# Patient Record
Sex: Female | Born: 1935
Health system: Southern US, Community
[De-identification: ages and names within clinical notes are randomized; demographics above are authoritative.]

## PROBLEM LIST (undated history)

## (undated) DIAGNOSIS — M810 Age-related osteoporosis without current pathological fracture: Secondary | ICD-10-CM

## (undated) DIAGNOSIS — W19XXXA Unspecified fall, initial encounter: Secondary | ICD-10-CM

## (undated) DIAGNOSIS — N200 Calculus of kidney: Secondary | ICD-10-CM

## (undated) DIAGNOSIS — G20A1 Parkinson's disease without dyskinesia, without mention of fluctuations: Secondary | ICD-10-CM

## (undated) DIAGNOSIS — Z1211 Encounter for screening for malignant neoplasm of colon: Secondary | ICD-10-CM

## (undated) DIAGNOSIS — M797 Fibromyalgia: Secondary | ICD-10-CM

## (undated) DIAGNOSIS — R011 Cardiac murmur, unspecified: Secondary | ICD-10-CM

## (undated) DIAGNOSIS — R251 Tremor, unspecified: Secondary | ICD-10-CM

## (undated) DIAGNOSIS — I10 Essential (primary) hypertension: Secondary | ICD-10-CM

## (undated) DIAGNOSIS — S42309A Unspecified fracture of shaft of humerus, unspecified arm, initial encounter for closed fracture: Secondary | ICD-10-CM

## (undated) DIAGNOSIS — Z923 Personal history of irradiation: Secondary | ICD-10-CM

## (undated) DIAGNOSIS — Z853 Personal history of malignant neoplasm of breast: Secondary | ICD-10-CM

## (undated) DIAGNOSIS — G4730 Sleep apnea, unspecified: Secondary | ICD-10-CM

## (undated) DIAGNOSIS — C50919 Malignant neoplasm of unspecified site of unspecified female breast: Secondary | ICD-10-CM

## (undated) DIAGNOSIS — M1990 Unspecified osteoarthritis, unspecified site: Secondary | ICD-10-CM

## (undated) DIAGNOSIS — M719 Bursopathy, unspecified: Secondary | ICD-10-CM

## (undated) DIAGNOSIS — Z1239 Encounter for other screening for malignant neoplasm of breast: Secondary | ICD-10-CM

## (undated) DIAGNOSIS — G20 Parkinson's disease: Secondary | ICD-10-CM

## (undated) HISTORY — DX: Unspecified fall, initial encounter: W19.XXXA

## (undated) HISTORY — DX: Encounter for other screening for malignant neoplasm of breast: Z12.39

## (undated) HISTORY — DX: Bursopathy, unspecified: M71.9

## (undated) HISTORY — DX: Fibromyalgia: M79.7

## (undated) HISTORY — DX: Tremor, unspecified: R25.1

## (undated) HISTORY — DX: Encounter for screening for malignant neoplasm of colon: Z12.11

## (undated) HISTORY — PX: OTHER SURGICAL HISTORY: SHX169

## (undated) HISTORY — DX: Age-related osteoporosis without current pathological fracture: M81.0

## (undated) HISTORY — DX: Personal history of malignant neoplasm of breast: Z85.3

## (undated) HISTORY — DX: Unspecified osteoarthritis, unspecified site: M19.90

## (undated) HISTORY — DX: Essential (primary) hypertension: I10

## (undated) HISTORY — DX: Cardiac murmur, unspecified: R01.1

## (undated) HISTORY — DX: Sleep apnea, unspecified: G47.30

## (undated) HISTORY — DX: Calculus of kidney: N20.0

## (undated) HISTORY — DX: Unspecified fracture of shaft of humerus, unspecified arm, initial encounter for closed fracture: S42.309A

---

## 1972-10-18 HISTORY — PX: APPENDECTOMY: SHX54

## 1972-10-18 HISTORY — PX: ABDOMINAL HYSTERECTOMY: SHX81

## 2002-10-18 HISTORY — PX: COLONOSCOPY: SHX174

## 2004-10-18 DIAGNOSIS — G4730 Sleep apnea, unspecified: Secondary | ICD-10-CM

## 2004-10-18 HISTORY — DX: Sleep apnea, unspecified: G47.30

## 2005-04-01 ENCOUNTER — Ambulatory Visit: Payer: Self-pay | Admitting: Family Medicine

## 2006-05-14 ENCOUNTER — Emergency Department: Payer: Self-pay | Admitting: Emergency Medicine

## 2006-06-15 ENCOUNTER — Ambulatory Visit: Payer: Self-pay | Admitting: Urology

## 2006-06-15 ENCOUNTER — Other Ambulatory Visit: Payer: Self-pay

## 2006-06-16 ENCOUNTER — Ambulatory Visit: Payer: Self-pay | Admitting: Urology

## 2006-07-12 ENCOUNTER — Ambulatory Visit: Payer: Self-pay | Admitting: Family Medicine

## 2006-10-18 HISTORY — PX: LITHOTRIPSY: SUR834

## 2007-07-17 ENCOUNTER — Encounter: Payer: Self-pay | Admitting: Family Medicine

## 2007-07-19 ENCOUNTER — Encounter: Payer: Self-pay | Admitting: Family Medicine

## 2007-08-16 ENCOUNTER — Ambulatory Visit: Payer: Self-pay | Admitting: Family Medicine

## 2007-10-03 ENCOUNTER — Ambulatory Visit: Payer: Self-pay | Admitting: Family Medicine

## 2008-01-05 ENCOUNTER — Ambulatory Visit: Payer: Self-pay | Admitting: Family Medicine

## 2008-05-08 ENCOUNTER — Ambulatory Visit: Payer: Self-pay | Admitting: Family Medicine

## 2008-05-16 ENCOUNTER — Ambulatory Visit: Payer: Self-pay | Admitting: Family Medicine

## 2008-08-12 ENCOUNTER — Ambulatory Visit: Payer: Self-pay

## 2008-10-18 DIAGNOSIS — Z853 Personal history of malignant neoplasm of breast: Secondary | ICD-10-CM

## 2008-10-18 DIAGNOSIS — Z923 Personal history of irradiation: Secondary | ICD-10-CM

## 2008-10-18 DIAGNOSIS — C50919 Malignant neoplasm of unspecified site of unspecified female breast: Secondary | ICD-10-CM

## 2008-10-18 HISTORY — PX: BREAST SURGERY: SHX581

## 2008-10-18 HISTORY — PX: BREAST EXCISIONAL BIOPSY: SUR124

## 2008-10-18 HISTORY — DX: Malignant neoplasm of unspecified site of unspecified female breast: C50.919

## 2008-10-18 HISTORY — DX: Personal history of irradiation: Z92.3

## 2008-10-18 HISTORY — DX: Personal history of malignant neoplasm of breast: Z85.3

## 2008-10-24 ENCOUNTER — Ambulatory Visit: Payer: Self-pay | Admitting: Family Medicine

## 2008-11-04 ENCOUNTER — Ambulatory Visit: Payer: Self-pay | Admitting: Family Medicine

## 2009-01-09 ENCOUNTER — Ambulatory Visit: Payer: Self-pay | Admitting: General Surgery

## 2009-01-15 ENCOUNTER — Ambulatory Visit: Payer: Self-pay | Admitting: General Surgery

## 2009-01-16 ENCOUNTER — Ambulatory Visit: Payer: Self-pay | Admitting: Radiation Oncology

## 2009-02-03 ENCOUNTER — Ambulatory Visit: Payer: Self-pay | Admitting: Radiation Oncology

## 2009-02-15 ENCOUNTER — Ambulatory Visit: Payer: Self-pay | Admitting: Radiation Oncology

## 2009-03-18 ENCOUNTER — Ambulatory Visit: Payer: Self-pay | Admitting: Radiation Oncology

## 2009-04-17 ENCOUNTER — Ambulatory Visit: Payer: Self-pay | Admitting: Radiation Oncology

## 2009-05-18 ENCOUNTER — Ambulatory Visit: Payer: Self-pay | Admitting: Radiation Oncology

## 2009-06-18 ENCOUNTER — Ambulatory Visit: Payer: Self-pay | Admitting: General Surgery

## 2009-07-11 ENCOUNTER — Ambulatory Visit: Payer: Self-pay | Admitting: Physical Medicine and Rehabilitation

## 2009-08-19 ENCOUNTER — Ambulatory Visit: Payer: Self-pay | Admitting: Family Medicine

## 2009-09-04 ENCOUNTER — Encounter: Payer: Self-pay | Admitting: Rheumatology

## 2009-09-17 ENCOUNTER — Encounter: Payer: Self-pay | Admitting: Rheumatology

## 2009-09-17 ENCOUNTER — Ambulatory Visit: Payer: Self-pay | Admitting: Radiation Oncology

## 2009-09-26 ENCOUNTER — Ambulatory Visit: Payer: Self-pay | Admitting: Radiation Oncology

## 2009-10-18 ENCOUNTER — Ambulatory Visit: Payer: Self-pay | Admitting: Radiation Oncology

## 2009-10-18 DIAGNOSIS — M797 Fibromyalgia: Secondary | ICD-10-CM

## 2009-10-18 DIAGNOSIS — R251 Tremor, unspecified: Secondary | ICD-10-CM

## 2009-10-18 HISTORY — DX: Fibromyalgia: M79.7

## 2009-10-18 HISTORY — DX: Tremor, unspecified: R25.1

## 2009-12-22 ENCOUNTER — Ambulatory Visit: Payer: Self-pay | Admitting: General Surgery

## 2010-03-18 ENCOUNTER — Ambulatory Visit: Payer: Self-pay | Admitting: Radiation Oncology

## 2010-03-27 ENCOUNTER — Ambulatory Visit: Payer: Self-pay | Admitting: Radiation Oncology

## 2010-05-28 ENCOUNTER — Ambulatory Visit: Payer: Self-pay | Admitting: Gastroenterology

## 2010-05-28 HISTORY — PX: UPPER GI ENDOSCOPY: SHX6162

## 2010-06-30 ENCOUNTER — Ambulatory Visit: Payer: Self-pay | Admitting: General Surgery

## 2010-12-29 ENCOUNTER — Ambulatory Visit: Payer: Self-pay | Admitting: General Surgery

## 2010-12-30 ENCOUNTER — Ambulatory Visit: Payer: Self-pay | Admitting: General Surgery

## 2011-03-29 ENCOUNTER — Ambulatory Visit: Payer: Self-pay | Admitting: Radiation Oncology

## 2011-04-18 ENCOUNTER — Ambulatory Visit: Payer: Self-pay | Admitting: Radiation Oncology

## 2011-07-07 ENCOUNTER — Ambulatory Visit: Payer: Self-pay | Admitting: General Surgery

## 2012-01-26 ENCOUNTER — Ambulatory Visit: Payer: Self-pay | Admitting: General Surgery

## 2012-03-27 ENCOUNTER — Ambulatory Visit: Payer: Self-pay | Admitting: Radiation Oncology

## 2012-04-17 ENCOUNTER — Ambulatory Visit: Payer: Self-pay | Admitting: Radiation Oncology

## 2012-11-02 ENCOUNTER — Ambulatory Visit: Payer: Self-pay | Admitting: Family Medicine

## 2012-12-09 ENCOUNTER — Encounter: Payer: Self-pay | Admitting: General Surgery

## 2012-12-16 ENCOUNTER — Encounter: Payer: Self-pay | Admitting: General Surgery

## 2013-01-29 ENCOUNTER — Ambulatory Visit: Payer: Self-pay | Admitting: General Surgery

## 2013-01-29 ENCOUNTER — Other Ambulatory Visit: Payer: Self-pay | Admitting: General Surgery

## 2013-01-30 ENCOUNTER — Other Ambulatory Visit: Payer: Self-pay | Admitting: *Deleted

## 2013-01-30 DIAGNOSIS — Z853 Personal history of malignant neoplasm of breast: Secondary | ICD-10-CM

## 2013-01-30 MED ORDER — TAMOXIFEN CITRATE 20 MG PO TABS: 20.0000 mg | ORAL_TABLET | Freq: Every day | ORAL | Status: DC

## 2013-01-30 NOTE — Telephone Encounter (Signed)
Has been taking for 4 years, will need one more year

## 2013-02-02 ENCOUNTER — Ambulatory Visit: Payer: Self-pay | Admitting: General Surgery

## 2013-02-05 ENCOUNTER — Encounter: Payer: Self-pay | Admitting: General Surgery

## 2013-02-07 ENCOUNTER — Ambulatory Visit: Payer: Self-pay | Admitting: General Surgery

## 2013-02-20 ENCOUNTER — Encounter: Payer: Self-pay | Admitting: General Surgery

## 2013-03-01 ENCOUNTER — Ambulatory Visit: Payer: Self-pay | Admitting: General Surgery

## 2013-03-19 ENCOUNTER — Ambulatory Visit: Payer: Self-pay | Admitting: General Surgery

## 2013-03-26 ENCOUNTER — Ambulatory Visit: Payer: Self-pay | Admitting: Radiation Oncology

## 2013-04-17 ENCOUNTER — Ambulatory Visit: Payer: Self-pay | Admitting: Radiation Oncology

## 2013-04-24 ENCOUNTER — Ambulatory Visit: Payer: Self-pay | Admitting: General Surgery

## 2013-05-08 ENCOUNTER — Ambulatory Visit: Payer: Self-pay | Admitting: General Surgery

## 2013-05-10 ENCOUNTER — Ambulatory Visit (INDEPENDENT_AMBULATORY_CARE_PROVIDER_SITE_OTHER): Payer: Medicare Other | Admitting: General Surgery

## 2013-05-10 VITALS — BP 138/78 | HR 74 | Resp 12 | Ht 65.0 in | Wt 183.0 lb

## 2013-05-10 DIAGNOSIS — C50919 Malignant neoplasm of unspecified site of unspecified female breast: Secondary | ICD-10-CM

## 2013-05-10 DIAGNOSIS — Z853 Personal history of malignant neoplasm of breast: Secondary | ICD-10-CM

## 2013-05-10 DIAGNOSIS — C50912 Malignant neoplasm of unspecified site of left female breast: Secondary | ICD-10-CM

## 2013-05-10 NOTE — Progress Notes (Signed)
Patient ID: Payzlee Ryder, female   DOB: 1934/09/24, 79 y.o.   MRN: [id number]  Chief Complaint  Patient presents with  . Follow-up    mammogram    HPI Charolette Shere is a 79 y.o. female.  who presents for a follow up mammogram and breast evaluation.  She is now 4 years post left breast wide excision. The most recent mammogram was done on 02-02-13.  Patient does perform regular self breast checks and gets regular mammograms done.  No new breast issues. States she is "tired" in the mornings but later in day she seems to be "ok" and feels it may be related to Tamoxifen. The patient reminded me that 2 years ago we had a trial off tamoxifen with no improvement in her general sense of well-being or musculoskeletal symptoms.  HPI  Past Medical History  Diagnosis Date  . Personal history of malignant neoplasm of breast 2010    left breast cancer; status post wide excision with mastoplasty followed by whole breast radiation and tamoxifen  . Fibromyalgia 2011  . Unspecified essential hypertension   . Sleep apnea 2006  . Arthritis   . Kidney stones   . Breast screening, unspecified   . Special screening for malignant neoplasms, colon   . Undiagnosed cardiac murmurs   . Tremor 2011  . Bursitis     left hip  . Malignant neoplasm of upper-outer quadrant of female breast     DCIS; diagnosed on January 15, 2009. The patient had originally undergone a stereotactic biopsy for microcalcifications. Atypical hyperplasia was identified. Wide excision was completed to confirm the presence or absence of DCIS or invasive tumor. The patient had a 0.5 cm foci of DCIS adjacent to the previous biopsy site. This was a grade 1 lesion with focal necrosis.     Past Surgical History  Procedure Laterality Date  . Abdominal hysterectomy  1974  . Colonoscopy  2004  . Breast surgery Left 2010    left breast wide excision mastoplasty  . Lithotripsy  2008  . Appendectomy  1974  . Upper gi endoscopy  May 28, 2010    Biopsy showed evidence of mild chronic gastritis. There was no evidence of H. Pylori infection. A normal duodenum was reported.   . Breast biopsy Left 2010    No family history on file.  Social History History  Substance Use Topics  . Smoking status: Never Smoker   . Smokeless tobacco: Not on file  . Alcohol Use: No    No Known Allergies  Current Outpatient Prescriptions  Medication Sig Dispense Refill  . felodipine (PLENDIL) 10 MG 24 hr tablet Take 10 mg by mouth daily.      . Ibuprofen (ADVIL PO) Take by mouth.      . metoprolol succinate (TOPROL-XL) 25 MG 24 hr tablet Take 25 mg by mouth daily.      Marland Kitchen omeprazole (PRILOSEC) 40 MG capsule       . sucralfate (CARAFATE) 1 G tablet Take 1 g by mouth daily.      . tamoxifen (NOLVADEX) 20 MG tablet Take 1 tablet (20 mg total) by mouth daily.  30 tablet  11  . zolpidem (AMBIEN) 10 MG tablet Take 10 mg by mouth daily.       No current facility-administered medications for this visit.    Review of Systems Review of Systems  Constitutional: Negative.   Respiratory: Negative.   Cardiovascular: Negative.     Blood pressure 138/78, pulse 74, resp.  rate 12, height 5\' 5"  (1.651 m), weight 183 lb (83.008 kg).  Physical Exam Physical Exam  Constitutional: She is oriented to person, place, and time. She appears well-developed and well-nourished.  Cardiovascular: Normal rate and regular rhythm.   Pulmonary/Chest: Effort normal and breath sounds normal. Right breast exhibits no inverted nipple, no mass, no nipple discharge, no skin change and no tenderness. Left breast exhibits inverted nipple (slightly).  Lymphadenopathy:    She has no cervical adenopathy.    She has no axillary adenopathy.  Neurological: She is alert and oriented to person, place, and time.  Skin: Skin is warm and dry.   well healed scar left breast and focal thickening 2  O'clock which is unchanged  Data Reviewed Bilateral mammograms dated February 02, 2013  were reviewed. BI-RAD-2.  Assessment    Benign breast exam.     Plan    We'll plan for a followup exam with bilateral mammograms in one year. She'll continue her tamoxifen in the interval.        Earline Mayotte 05/11/2013, 1:56 PM

## 2013-05-10 NOTE — Patient Instructions (Addendum)
Continue self breast exams. Call office for any new breast issues or concerns. 

## 2013-05-11 ENCOUNTER — Encounter: Payer: Self-pay | Admitting: General Surgery

## 2013-05-29 ENCOUNTER — Encounter: Payer: Self-pay | Admitting: General Surgery

## 2014-02-05 ENCOUNTER — Ambulatory Visit: Payer: Self-pay | Admitting: General Surgery

## 2014-02-05 ENCOUNTER — Encounter: Payer: Self-pay | Admitting: General Surgery

## 2014-02-21 ENCOUNTER — Ambulatory Visit: Payer: Medicare Other | Admitting: General Surgery

## 2014-03-07 ENCOUNTER — Ambulatory Visit: Payer: Commercial Managed Care - HMO | Admitting: General Surgery

## 2014-03-21 ENCOUNTER — Ambulatory Visit: Payer: Self-pay | Admitting: General Surgery

## 2014-04-09 ENCOUNTER — Encounter: Payer: Self-pay | Admitting: General Surgery

## 2014-04-10 ENCOUNTER — Ambulatory Visit: Payer: Self-pay | Admitting: General Surgery

## 2014-05-01 ENCOUNTER — Ambulatory Visit (INDEPENDENT_AMBULATORY_CARE_PROVIDER_SITE_OTHER): Payer: Commercial Managed Care - HMO | Admitting: General Surgery

## 2014-05-01 ENCOUNTER — Encounter: Payer: Self-pay | Admitting: General Surgery

## 2014-05-01 VITALS — BP 120/70 | Ht 65.0 in | Wt 178.0 lb

## 2014-05-01 DIAGNOSIS — C50912 Malignant neoplasm of unspecified site of left female breast: Secondary | ICD-10-CM

## 2014-05-01 DIAGNOSIS — C50919 Malignant neoplasm of unspecified site of unspecified female breast: Secondary | ICD-10-CM

## 2014-05-01 NOTE — Progress Notes (Signed)
Patient ID: Kristina Simmons, female   DOB: [birth year], 80 y.o.   MRN: [id number]  Chief Complaint  Patient presents with  . Follow-up    mammogram    HPI Kristina Simmons is a 80 y.o. female who presents for a breast evaluation. The most recent mammogram was done on 02/05/14. Patient does perform regular self breast checks and gets regular mammograms done.   The patient completed her 5 years of tamoxifen earlier this year. She discontinue the medication without olfactory HPI  Past Medical History  Diagnosis Date  . Personal history of malignant neoplasm of breast 2010    left breast cancer; status post wide excision with mastoplasty followed by whole breast radiation and tamoxifen  . Fibromyalgia 2011  . Unspecified essential hypertension   . Sleep apnea 2006  . Arthritis   . Kidney stones   . Breast screening, unspecified   . Special screening for malignant neoplasms, colon   . Undiagnosed cardiac murmurs   . Tremor 2011  . Bursitis     left hip  . Malignant neoplasm of upper-outer quadrant of female breast     DCIS; diagnosed on January 15, 2009. The patient had originally undergone a stereotactic biopsy for microcalcifications. Atypical hyperplasia was identified. Wide excision was completed to confirm the presence or absence of DCIS or invasive tumor. The patient had a 0.5 cm foci of DCIS adjacent to the previous biopsy site. This was a grade 1 lesion with focal necrosis.     Past Surgical History  Procedure Laterality Date  . Abdominal hysterectomy  1974  . Colonoscopy  2004  . Breast surgery Left 2010    left breast wide excision mastoplasty  . Lithotripsy  2008  . Appendectomy  1974  . Upper gi endoscopy  May 28, 2010    Biopsy showed evidence of mild chronic gastritis. There was no evidence of H. Pylori infection. A normal duodenum was reported.   . Breast biopsy Left 2010    No family history on file.  Social History History  Substance Use Topics  .  Smoking status: Never Smoker   . Smokeless tobacco: Not on file  . Alcohol Use: No    No Known Allergies  Current Outpatient Prescriptions  Medication Sig Dispense Refill  . Ibuprofen (ADVIL PO) Take by mouth.      . metoprolol succinate (TOPROL-XL) 25 MG 24 hr tablet Take 25 mg by mouth daily.      . ranitidine (ZANTAC) 300 MG tablet Take 300 mg by mouth at bedtime.       No current facility-administered medications for this visit.    Review of Systems Review of Systems  Constitutional: Negative.   Respiratory: Negative.   Cardiovascular: Negative.     Blood pressure 120/70, height 5\' 5"  (1.651 m), weight 178 lb (80.74 kg).  Physical Exam Physical Exam  Constitutional: She is oriented to person, place, and time. She appears well-developed.  Eyes: Conjunctivae are normal. No scleral icterus.  Neck: Neck supple.  Cardiovascular: Normal rate.   Murmur heard.  Systolic murmur is present with a grade of 2/6  Pulmonary/Chest: Effort normal and breath sounds normal. Right breast exhibits no inverted nipple, no mass, no nipple discharge, no skin change and no tenderness. Left breast exhibits no inverted nipple, no mass, no nipple discharge, no skin change and no tenderness.  Left breast focal thickening at 2 o'clock  Abdominal: Soft. Bowel sounds are normal. There is no tenderness.  Lymphadenopathy:  She has no cervical adenopathy.    She has no axillary adenopathy.  Neurological: She is alert and oriented to person, place, and time.  Skin: Skin is warm and dry.    Data Reviewed Mammogram dated 02/05/2014 was independently reviewed. No interval change. BI-RAD-2.  Assessment    Doing well enough 5 year status post left breast DCIS management    Plan    The patient is now 5 years status post low-grade DCIS. She is released from regular followup. She'll continue annual mammograms with her primary care provider. He is welcome to return at any time if she appreciates any  change on her clinical exam or of mammographic abnormalities are identified.     PCP: Mamie Laurel 05/02/2014, 3:22 PM

## 2014-05-01 NOTE — Patient Instructions (Signed)
Patient to return as needed. 

## 2014-05-02 ENCOUNTER — Encounter: Payer: Self-pay | Admitting: General Surgery

## 2014-08-19 ENCOUNTER — Encounter: Payer: Self-pay | Admitting: General Surgery

## 2015-03-25 ENCOUNTER — Telehealth: Payer: Self-pay

## 2015-03-25 NOTE — Telephone Encounter (Signed)
Pt is checking status on her mammogram referral appointment with [facility]. Please return call when complete

## 2015-03-31 NOTE — Telephone Encounter (Signed)
Could you please order a mammogram, I didn't see one ordered in the other system.

## 2015-04-04 ENCOUNTER — Other Ambulatory Visit: Payer: Self-pay | Admitting: Family Medicine

## 2015-04-11 NOTE — Telephone Encounter (Signed)
Please put in order for mammogram

## 2015-04-14 NOTE — Telephone Encounter (Signed)
Ok

## 2015-04-14 NOTE — Telephone Encounter (Deleted)
Have this been taken care of? If so please close encounter

## 2015-04-15 ENCOUNTER — Other Ambulatory Visit: Payer: Self-pay

## 2015-04-15 DIAGNOSIS — Z1239 Encounter for other screening for malignant neoplasm of breast: Secondary | ICD-10-CM

## 2015-04-24 ENCOUNTER — Other Ambulatory Visit: Payer: Self-pay | Admitting: Emergency Medicine

## 2015-04-24 DIAGNOSIS — Z1239 Encounter for other screening for malignant neoplasm of breast: Secondary | ICD-10-CM

## 2015-04-25 ENCOUNTER — Telehealth: Payer: Self-pay | Admitting: Emergency Medicine

## 2015-04-25 NOTE — Telephone Encounter (Signed)
[place] and dx mammogram appointment made at [facility] for May 01, 2015 at 1:20 pm

## 2015-04-25 NOTE — Progress Notes (Signed)
ok 

## 2015-05-01 ENCOUNTER — Other Ambulatory Visit: Payer: Self-pay | Admitting: Family Medicine

## 2015-05-01 ENCOUNTER — Encounter: Payer: Self-pay | Admitting: Family Medicine

## 2015-05-01 ENCOUNTER — Ambulatory Visit
Admission: RE | Admit: 2015-05-01 | Discharge: 2015-05-01 | Disposition: A | Discharge status: Home / Self Care | Payer: PPO | Source: Ambulatory Visit | Attending: Family Medicine | Admitting: Family Medicine

## 2015-05-01 ENCOUNTER — Ambulatory Visit: Payer: Self-pay

## 2015-05-01 DIAGNOSIS — Z1239 Encounter for other screening for malignant neoplasm of breast: Secondary | ICD-10-CM

## 2015-05-01 DIAGNOSIS — Z853 Personal history of malignant neoplasm of breast: Secondary | ICD-10-CM | POA: Diagnosis not present

## 2015-05-27 DIAGNOSIS — R251 Tremor, unspecified: Secondary | ICD-10-CM | POA: Insufficient documentation

## 2015-06-17 ENCOUNTER — Encounter: Payer: Self-pay | Admitting: Family Medicine

## 2015-06-17 ENCOUNTER — Ambulatory Visit (INDEPENDENT_AMBULATORY_CARE_PROVIDER_SITE_OTHER): Payer: PPO | Admitting: Family Medicine

## 2015-06-17 VITALS — BP 134/88 | HR 91 | Temp 98.6°F | Resp 16 | Ht 64.0 in | Wt 189.4 lb

## 2015-06-17 DIAGNOSIS — K219 Gastro-esophageal reflux disease without esophagitis: Secondary | ICD-10-CM

## 2015-06-17 DIAGNOSIS — G4700 Insomnia, unspecified: Secondary | ICD-10-CM

## 2015-06-17 DIAGNOSIS — G8929 Other chronic pain: Secondary | ICD-10-CM

## 2015-06-17 DIAGNOSIS — I10 Essential (primary) hypertension: Secondary | ICD-10-CM

## 2015-06-17 DIAGNOSIS — Z23 Encounter for immunization: Secondary | ICD-10-CM | POA: Diagnosis not present

## 2015-06-17 MED ORDER — CYCLOBENZAPRINE HCL 5 MG PO TABS: 5.0000 mg | ORAL_TABLET | Freq: Three times a day (TID) | ORAL | Status: DC | PRN

## 2015-06-17 MED ORDER — MELOXICAM 15 MG PO TABS: 15.0000 mg | ORAL_TABLET | Freq: Every day | ORAL | Status: DC

## 2015-06-17 NOTE — Progress Notes (Signed)
Name: Kristina Simmons   MRN: [id number]    DOB: 01/19/1934   Date:06/17/2015       Progress Note  Subjective  Chief Complaint  Chief Complaint  Patient presents with  . Hypertension  . Insomnia    Hypertension Associated symptoms include headaches and malaise/fatigue. Pertinent negatives include no blurred vision, chest pain, neck pain, orthopnea, palpitations or shortness of breath.  Insomnia Primary symptoms: malaise/fatigue.  PMH includes: depression.   Insomnia this is a follow-up for insomnia. This has been a chronic problem for many years. She has fragmented sleep as well as difficulty falling asleep and early awakening. It is happening most nights. Risk factors include anxiety and chronic pain  Hypertension   Patient presents for follow-up of hypertension. It has been present for over 10 years.  Patient states that there is compliance with medical regimen which consists of metoprolol 25 mg every 12 hours amlodipine 10 mg daily. . There is no end organ disease . Cardiac risk factors include hypertension hyperlipidemia and sedentary lifestyle Exercise regimen consist of minimal  Diet consist of some limitation of salt and fats   Chronic pain  Patient has a long-standing history of chronic pain in the sacral area. She's had multiple procedures as well as imaging studies and multiple consultations still has the pain intermittently. She manages with over-the-counter meds usually.  Fibromyalgia and chronic fatigue  Please problem has been present for over a decade. She has chronic muscle pain and spasm and fatigue. It varies according to stressors her sleep pattern and the weather pattern is and any other concomitant medical conditions. Past Medical History  Diagnosis Date  . Personal history of malignant neoplasm of breast 2010    left breast cancer; status post wide excision with mastoplasty followed by whole breast radiation and tamoxifen  . Fibromyalgia 2011  .  Unspecified essential hypertension   . Sleep apnea 2006  . Arthritis   . Kidney stones   . Breast screening, unspecified   . Special screening for malignant neoplasms, colon   . Undiagnosed cardiac murmurs   . Tremor 2011  . Bursitis     left hip  . Malignant neoplasm of upper-outer quadrant of female breast     DCIS; diagnosed on January 15, 2009. The patient had originally undergone a stereotactic biopsy for microcalcifications. Atypical hyperplasia was identified. Wide excision was completed to confirm the presence or absence of DCIS or invasive tumor. The patient had a 0.5 cm foci of DCIS adjacent to the previous biopsy site. This was a grade 1 lesion with focal necrosis. ER 90%, PR 50%     Social History  Substance Use Topics  . Smoking status: Never Smoker   . Smokeless tobacco: Not on file  . Alcohol Use: No     Current outpatient prescriptions:  .  amLODipine (NORVASC) 10 MG tablet, 1 (ONE) TABLET, ORAL, DAILY, Disp: 30 tablet, Rfl: 3 .  gabapentin (NEURONTIN) 100 MG capsule, Take by mouth., Disp: , Rfl:  .  Ibuprofen (ADVIL PO), Take by mouth., Disp: , Rfl:  .  metoprolol succinate (TOPROL-XL) 25 MG 24 hr tablet, Take 25 mg by mouth daily., Disp: , Rfl:  .  zolpidem (AMBIEN) 10 MG tablet, Take by mouth., Disp: , Rfl:  .  ranitidine (ZANTAC) 300 MG tablet, Take 300 mg by mouth at bedtime., Disp: , Rfl:   No Known Allergies  Review of Systems  Constitutional: Positive for malaise/fatigue. Negative for fever, chills and weight loss.  HENT: Positive for congestion. Negative for hearing loss, sore throat and tinnitus.   Eyes: Negative for blurred vision, double vision and redness.  Respiratory: Positive for cough. Negative for hemoptysis and shortness of breath.   Cardiovascular: Negative for chest pain, palpitations, orthopnea, claudication and leg swelling.  Gastrointestinal: Positive for heartburn. Negative for nausea, vomiting, diarrhea, constipation and blood in stool.   Genitourinary: Negative for dysuria, urgency, frequency and hematuria.  Musculoskeletal: Positive for joint pain (diffuse from her fibromyalgia). Negative for myalgias, back pain, falls and neck pain.       Sacroiliac pain of long-standing  Skin: Negative for itching.  Neurological: Positive for weakness and headaches. Negative for dizziness, tingling, tremors, focal weakness, seizures and loss of consciousness.  Endo/Heme/Allergies: Does not bruise/bleed easily.  Psychiatric/Behavioral: Positive for depression. Negative for substance abuse. The patient is nervous/anxious and has insomnia.      Objective  Filed Vitals:   06/17/15 1049  BP: 134/88  Pulse: 91  Temp: 98.6 F (37 C)  Resp: 16  Height: 5\' 4"  (1.626 m)  Weight: 189 lb 7 oz (85.928 kg)  SpO2: 93%     Physical Exam  Constitutional: She is oriented to person, place, and time.  Modestly obese  HENT:  Head: Normocephalic.  Eyes: EOM are normal. Pupils are equal, round, and reactive to light.  Neck: Normal range of motion. No thyromegaly present.  Cardiovascular: Normal rate, regular rhythm, normal heart sounds and intact distal pulses.   No murmur heard. Pulmonary/Chest: Effort normal and breath sounds normal.  Abdominal: Soft. Bowel sounds are normal.  Musculoskeletal: She exhibits no edema.  Tenderness along several trigger points along the entire spine.  Neurological: She is alert and oriented to person, place, and time. No cranial nerve deficit. Gait normal.  Skin: Skin is warm and dry. No rash noted.  Psychiatric: Memory and affect normal.  Appears depressed      Assessment & Plan  1. Essential hypertension Well-controlled ongoing issue  2. Chronic pain  - meloxicam (MOBIC) 15 MG tablet; Take 1 tablet (15 mg total) by mouth daily.  Dispense: 30 tablet; Refill: 0 - cyclobenzaprine (FLEXERIL) 5 MG tablet; Take 1 tablet (5 mg total) by mouth 3 (three) times daily as needed for muscle spasms.  Dispense:  30 tablet; Refill: 1  3. Insomnia Ongoing issue  4. Need for influenza vaccination Given today - Flu vaccine HIGH DOSE PF (Fluzone High dose)  5. Gastroesophageal reflux disease, esophagitis presence not specified  - TSH

## 2015-06-18 ENCOUNTER — Encounter: Payer: Self-pay | Admitting: Family Medicine

## 2015-06-18 LAB — TSH: TSH: 1.01 u[IU]/mL (ref 0.450–4.500)

## 2015-06-19 ENCOUNTER — Telehealth: Payer: Self-pay | Admitting: Family Medicine

## 2015-06-19 NOTE — Telephone Encounter (Signed)
Pt called stating that pharmacy states she is not our pt and 2 prescriptions were denied due to that. She would like a call back.

## 2015-06-20 MED ORDER — METOPROLOL SUCCINATE ER 25 MG PO TB24: 25.0000 mg | ORAL_TABLET | Freq: Every day | ORAL | Status: DC

## 2015-06-20 NOTE — Telephone Encounter (Signed)
Spoke with pt and called in rx to pharmacy.

## 2015-10-16 ENCOUNTER — Ambulatory Visit: Payer: PPO | Admitting: Family Medicine

## 2015-10-16 ENCOUNTER — Other Ambulatory Visit: Payer: Self-pay | Admitting: Family Medicine

## 2015-10-20 ENCOUNTER — Other Ambulatory Visit: Payer: Self-pay | Admitting: Family Medicine

## 2015-11-06 ENCOUNTER — Ambulatory Visit: Payer: PPO | Admitting: Family Medicine

## 2015-12-25 ENCOUNTER — Ambulatory Visit: Payer: Self-pay | Admitting: Family Medicine

## 2016-01-25 ENCOUNTER — Other Ambulatory Visit: Payer: Self-pay | Admitting: Family Medicine

## 2016-02-04 ENCOUNTER — Ambulatory Visit: Payer: Self-pay | Admitting: Family Medicine

## 2016-02-05 ENCOUNTER — Other Ambulatory Visit: Payer: Self-pay | Admitting: Family Medicine

## 2016-02-09 ENCOUNTER — Other Ambulatory Visit: Payer: Self-pay | Admitting: Emergency Medicine

## 2016-03-01 ENCOUNTER — Emergency Department: Payer: PPO

## 2016-03-01 ENCOUNTER — Emergency Department
Admission: EM | Admit: 2016-03-01 | Discharge: 2016-03-01 | Disposition: A | Discharge status: Home / Self Care | Payer: PPO | Attending: Emergency Medicine | Admitting: Emergency Medicine

## 2016-03-01 ENCOUNTER — Encounter: Payer: Self-pay | Admitting: Emergency Medicine

## 2016-03-01 DIAGNOSIS — Z791 Long term (current) use of non-steroidal anti-inflammatories (NSAID): Secondary | ICD-10-CM | POA: Diagnosis not present

## 2016-03-01 DIAGNOSIS — S4992XA Unspecified injury of left shoulder and upper arm, initial encounter: Secondary | ICD-10-CM | POA: Diagnosis not present

## 2016-03-01 DIAGNOSIS — S42202A Unspecified fracture of upper end of left humerus, initial encounter for closed fracture: Secondary | ICD-10-CM | POA: Insufficient documentation

## 2016-03-01 DIAGNOSIS — Y9389 Activity, other specified: Secondary | ICD-10-CM | POA: Diagnosis not present

## 2016-03-01 DIAGNOSIS — S199XXA Unspecified injury of neck, initial encounter: Secondary | ICD-10-CM | POA: Diagnosis not present

## 2016-03-01 DIAGNOSIS — S0512XA Contusion of eyeball and orbital tissues, left eye, initial encounter: Secondary | ICD-10-CM | POA: Diagnosis not present

## 2016-03-01 DIAGNOSIS — S025XXA Fracture of tooth (traumatic), initial encounter for closed fracture: Secondary | ICD-10-CM | POA: Insufficient documentation

## 2016-03-01 DIAGNOSIS — S0990XA Unspecified injury of head, initial encounter: Secondary | ICD-10-CM | POA: Diagnosis not present

## 2016-03-01 DIAGNOSIS — M79602 Pain in left arm: Secondary | ICD-10-CM | POA: Diagnosis not present

## 2016-03-01 DIAGNOSIS — S0181XA Laceration without foreign body of other part of head, initial encounter: Secondary | ICD-10-CM | POA: Insufficient documentation

## 2016-03-01 DIAGNOSIS — S80212A Abrasion, left knee, initial encounter: Secondary | ICD-10-CM | POA: Diagnosis not present

## 2016-03-01 DIAGNOSIS — Z853 Personal history of malignant neoplasm of breast: Secondary | ICD-10-CM | POA: Insufficient documentation

## 2016-03-01 DIAGNOSIS — I10 Essential (primary) hypertension: Secondary | ICD-10-CM | POA: Diagnosis not present

## 2016-03-01 DIAGNOSIS — M1990 Unspecified osteoarthritis, unspecified site: Secondary | ICD-10-CM | POA: Diagnosis not present

## 2016-03-01 DIAGNOSIS — Z79899 Other long term (current) drug therapy: Secondary | ICD-10-CM | POA: Diagnosis not present

## 2016-03-01 DIAGNOSIS — S0003XA Contusion of scalp, initial encounter: Secondary | ICD-10-CM | POA: Diagnosis not present

## 2016-03-01 DIAGNOSIS — Y999 Unspecified external cause status: Secondary | ICD-10-CM | POA: Insufficient documentation

## 2016-03-01 DIAGNOSIS — S42309A Unspecified fracture of shaft of humerus, unspecified arm, initial encounter for closed fracture: Secondary | ICD-10-CM

## 2016-03-01 DIAGNOSIS — Y929 Unspecified place or not applicable: Secondary | ICD-10-CM | POA: Diagnosis not present

## 2016-03-01 DIAGNOSIS — E876 Hypokalemia: Secondary | ICD-10-CM | POA: Diagnosis not present

## 2016-03-01 DIAGNOSIS — S42352A Displaced comminuted fracture of shaft of humerus, left arm, initial encounter for closed fracture: Secondary | ICD-10-CM | POA: Diagnosis not present

## 2016-03-01 DIAGNOSIS — W19XXXA Unspecified fall, initial encounter: Secondary | ICD-10-CM | POA: Diagnosis not present

## 2016-03-01 DIAGNOSIS — S299XXA Unspecified injury of thorax, initial encounter: Secondary | ICD-10-CM | POA: Diagnosis not present

## 2016-03-01 DIAGNOSIS — R112 Nausea with vomiting, unspecified: Secondary | ICD-10-CM | POA: Diagnosis not present

## 2016-03-01 DIAGNOSIS — S42302A Unspecified fracture of shaft of humerus, left arm, initial encounter for closed fracture: Secondary | ICD-10-CM

## 2016-03-01 HISTORY — DX: Unspecified fracture of shaft of humerus, unspecified arm, initial encounter for closed fracture: S42.309A

## 2016-03-01 LAB — COMPREHENSIVE METABOLIC PANEL
ALK PHOS: 51 U/L (ref 38–126)
ALT: 38 U/L (ref 14–54)
AST: 41 U/L (ref 15–41)
Albumin: 4.4 g/dL (ref 3.5–5.0)
Anion gap: 8 (ref 5–15)
BUN: 17 mg/dL (ref 6–20)
CALCIUM: 9.6 mg/dL (ref 8.9–10.3)
CO2: 23 mmol/L (ref 22–32)
CREATININE: 1.11 mg/dL — AB (ref 0.44–1.00)
Chloride: 107 mmol/L (ref 101–111)
GFR, EST AFRICAN AMERICAN: 53 mL/min — AB (ref >60)
GFR, EST NON AFRICAN AMERICAN: 46 mL/min — AB (ref >60)
Glucose, Bld: 122 mg/dL — ABNORMAL HIGH (ref 65–99)
Potassium: 3.1 mmol/L — ABNORMAL LOW (ref 3.5–5.1)
Sodium: 138 mmol/L (ref 135–145)
Total Bilirubin: 0.6 mg/dL (ref 0.3–1.2)
Total Protein: 7.6 g/dL (ref 6.5–8.1)

## 2016-03-01 LAB — CBC
HCT: 44.8 % (ref 35.0–47.0)
HEMOGLOBIN: 15.2 g/dL (ref 12.0–16.0)
MCH: 31.7 pg (ref 26.0–34.0)
MCHC: 34 g/dL (ref 32.0–36.0)
MCV: 93.4 fL (ref 80.0–100.0)
PLATELETS: 208 10*3/uL (ref 150–440)
RBC: 4.8 MIL/uL (ref 3.80–5.20)
RDW: 13 % (ref 11.5–14.5)
WBC: 8.7 10*3/uL (ref 3.6–11.0)

## 2016-03-01 LAB — TROPONIN I

## 2016-03-01 MED ORDER — OXYCODONE HCL 5 MG PO TABS: 5.0000 mg | ORAL_TABLET | Freq: Four times a day (QID) | ORAL | Status: DC | PRN

## 2016-03-01 MED ORDER — LIDOCAINE-EPINEPHRINE (PF) 1 %-1:200000 IJ SOLN
INTRAMUSCULAR | Status: AC
  Administered 2016-03-01: 10 mL via INTRADERMAL
  Filled 2016-03-01: qty 30

## 2016-03-01 MED ORDER — METOCLOPRAMIDE HCL 5 MG/ML IJ SOLN
INTRAMUSCULAR | Status: AC
  Administered 2016-03-01: 10 mg via INTRAVENOUS
  Filled 2016-03-01: qty 2

## 2016-03-01 MED ORDER — POTASSIUM CHLORIDE CRYS ER 20 MEQ PO TBCR
40.0000 meq | EXTENDED_RELEASE_TABLET | Freq: Once | ORAL | Status: AC
  Administered 2016-03-01: 40 meq via ORAL
  Filled 2016-03-01: qty 2

## 2016-03-01 MED ORDER — LIDOCAINE-EPINEPHRINE (PF) 2 %-1:200000 IJ SOLN: 20.0000 mL | Freq: Once | INTRAMUSCULAR | Status: DC

## 2016-03-01 MED ORDER — ONDANSETRON HCL 4 MG/2ML IJ SOLN
4.0000 mg | Freq: Once | INTRAMUSCULAR | Status: AC
  Administered 2016-03-01: 4 mg via INTRAVENOUS

## 2016-03-01 MED ORDER — METOCLOPRAMIDE HCL 5 MG/ML IJ SOLN
10.0000 mg | Freq: Once | INTRAMUSCULAR | Status: AC
  Administered 2016-03-01: 10 mg via INTRAVENOUS
  Filled 2016-03-01: qty 2

## 2016-03-01 MED ORDER — LIDOCAINE-EPINEPHRINE (PF) 1 %-1:200000 IJ SOLN
10.0000 mL | Freq: Once | INTRAMUSCULAR | Status: AC
  Administered 2016-03-01: 10 mL via INTRADERMAL

## 2016-03-01 MED ORDER — BACITRACIN ZINC 500 UNIT/GM EX OINT
TOPICAL_OINTMENT | Freq: Once | CUTANEOUS | Status: AC
  Administered 2016-03-01: 1 via TOPICAL
  Filled 2016-03-01: qty 0.9

## 2016-03-01 MED ORDER — FENTANYL CITRATE (PF) 100 MCG/2ML IJ SOLN
INTRAMUSCULAR | Status: AC
  Administered 2016-03-01: 50 ug via INTRAVENOUS
  Filled 2016-03-01: qty 2

## 2016-03-01 MED ORDER — ONDANSETRON HCL 4 MG/2ML IJ SOLN
4.0000 mg | Freq: Once | INTRAMUSCULAR | Status: AC
  Administered 2016-03-01: 4 mg via INTRAVENOUS
  Filled 2016-03-01: qty 2

## 2016-03-01 MED ORDER — SODIUM CHLORIDE 0.9 % IV BOLUS (SEPSIS)
1000.0000 mL | Freq: Once | INTRAVENOUS | Status: AC
  Administered 2016-03-01: 1000 mL via INTRAVENOUS

## 2016-03-01 MED ORDER — ONDANSETRON HCL 4 MG/2ML IJ SOLN
INTRAMUSCULAR | Status: AC
  Administered 2016-03-01: 4 mg via INTRAVENOUS
  Filled 2016-03-01: qty 2

## 2016-03-01 MED ORDER — METOCLOPRAMIDE HCL 5 MG/ML IJ SOLN
10.0000 mg | Freq: Once | INTRAMUSCULAR | Status: AC
  Administered 2016-03-01: 10 mg via INTRAVENOUS

## 2016-03-01 MED ORDER — SODIUM CHLORIDE 0.9 % IV BOLUS (SEPSIS)
1000.0000 mL | Freq: Once | INTRAVENOUS | Status: AC
Start: 2016-03-01 — End: 2016-03-01
  Administered 2016-03-01: 1000 mL via INTRAVENOUS

## 2016-03-01 MED ORDER — ONDANSETRON 4 MG PO TBDP: 4.0000 mg | ORAL_TABLET | Freq: Three times a day (TID) | ORAL | Status: DC | PRN

## 2016-03-01 MED ORDER — FENTANYL CITRATE (PF) 100 MCG/2ML IJ SOLN
50.0000 ug | Freq: Once | INTRAMUSCULAR | Status: AC
Start: 2016-03-01 — End: 2016-03-01
  Administered 2016-03-01: 50 ug via INTRAVENOUS

## 2016-03-01 NOTE — ED Notes (Signed)
C-collar removed from patient. Patient sat up and given ginger ale.

## 2016-03-01 NOTE — ED Notes (Signed)
Patients face, hair, and hands cleaned by this RN and Mickel Baas, NT. Patient tolerated well. Laceration is covered with saline soaked gauze.

## 2016-03-01 NOTE — ED Provider Notes (Signed)
[facility] Emergency Department Provider Note  ____________________________________________  Time seen: Approximately 4:06 PM  I have reviewed the triage vital signs and the nursing notes.   HISTORY  Chief Complaint Fall    HPI Kristina Simmons is a 82 y.o. female brought by EMS for a fall. The patient is altered, mildly somnolent and unable to give a history about what happened. The patient's family reports that they were walking and she was behind them, and the next thing they knew she was on the ground face down. EMS reports stable vital signs, normal blood sugar, and ecchymosis over the left orbit with facial abrasions, and deformity and pain of the left upper extremity. At the time of arrival, the patient has stable vital signs and is able to tell me her name. She states is 2014. She is protecting her airway.   Past Medical History  Diagnosis Date  . Personal history of malignant neoplasm of breast 2010    left breast cancer; status post wide excision with mastoplasty followed by whole breast radiation and tamoxifen  . Fibromyalgia 2011  . Unspecified essential hypertension   . Sleep apnea 2006  . Arthritis   . Kidney stones   . Breast screening, unspecified   . Special screening for malignant neoplasms, colon   . Undiagnosed cardiac murmurs   . Tremor 2011  . Bursitis     left hip  . Malignant neoplasm of upper-outer quadrant of female breast ([place])     DCIS; diagnosed on January 15, 2009. The patient had originally undergone a stereotactic biopsy for microcalcifications. Atypical hyperplasia was identified. Wide excision was completed to confirm the presence or absence of DCIS or invasive tumor. The patient had a 0.5 cm foci of DCIS adjacent to the previous biopsy site. This was a grade 1 lesion with focal necrosis. ER 90%, PR 50%     Patient Active Problem List   Diagnosis Date Noted  . Has a tremor 05/27/2015  . Breast cancer, left breast  ([place]) 01/15/2009    Past Surgical History  Procedure Laterality Date  . Abdominal hysterectomy  1974  . Colonoscopy  2004  . Lithotripsy  2008  . Appendectomy  1974  . Upper gi endoscopy  May 28, 2010    Biopsy showed evidence of mild chronic gastritis. There was no evidence of H. Pylori infection. A normal duodenum was reported.   . Breast surgery Left 2010    left breast wide excision mastoplasty  . Status post radiation therapy Left     breast cancer  . Breast excisional biopsy Left 2010    breast cancer    Current Outpatient Rx  Name  Route  Sig  Dispense  Refill  . amLODipine (NORVASC) 10 MG tablet   Oral   Take 10 mg by mouth every other day.         . ibuprofen (ADVIL,MOTRIN) 200 MG tablet   Oral   Take 800 mg by mouth every 6 (six) hours as needed for headache or mild pain.         . metoprolol succinate (TOPROL-XL) 25 MG 24 hr tablet   Oral   Take 25 mg by mouth daily.         . ranitidine (ZANTAC) 300 MG tablet   Oral   Take 300 mg by mouth 2 (two) times daily as needed for heartburn.          . ondansetron (ZOFRAN ODT) 4 MG disintegrating tablet  Oral   Take 1 tablet (4 mg total) by mouth every 8 (eight) hours as needed for nausea or vomiting.   20 tablet   0   . oxyCODONE (ROXICODONE) 5 MG immediate release tablet   Oral   Take 1 tablet (5 mg total) by mouth every 6 (six) hours as needed for severe pain.   15 tablet   0     Allergies Review of patient's allergies indicates no known allergies.  No family history on file.  Social History Social History  Substance Use Topics  . Smoking status: Never Smoker   . Smokeless tobacco: None  . Alcohol Use: No    Review of Systems Constitutional: No fever/chills.Positive fall. Negative Known syncope.  Eyes: Left orbital ecchymosis with swelling of the upper eyelid. No blurred vision or double vision. ENT: No sore throat. No congestion or rhinorrhea. Cardiovascular: Denies chest pain.  Denies palpitations. Respiratory: Denies shortness of breath.  No cough. Gastrointestinal: No abdominal pain.  Positive nausea, positive vomiting.  No diarrhea.  No constipation. Genitourinary: Negative for dysuria. Musculoskeletal: Negative for back pain. Positive left shoulder and upper arm pain. Negative neck pain. Negative pain in the hips. Skin: Negative for rash. Positive abrasion below the left knee. Neurological: Negative for headaches. No focal numbness, tingling or weakness.   10-point ROS otherwise negative.  ____________________________________________   PHYSICAL EXAM:  VITAL SIGNS: ED Triage Vitals  Enc Vitals Group     BP 03/01/16 1547 149/75 mmHg     Pulse Rate 03/01/16 1547 68     Resp 03/01/16 1547 14     Temp 03/01/16 1547 97.8 F (36.6 C)     Temp Source 03/01/16 1547 Oral     SpO2 03/01/16 1547 96 %     Weight 03/01/16 1547 167 lb (75.751 kg)     Height 03/01/16 1547 5\' 5"  (1.651 m)     Head Cir --      Peak Flow --      Pain Score 03/01/16 1548 10     Pain Loc --      Pain Edu? --      Excl. in [place]? --     Constitutional: Patient is alert and oriented to person but not year or circumstance. She is lying in the stretcher with blood on her face, and intermittently actively vomiting. She is protecting her airway.  Eyes: Conjunctivae are normal.  EOMI. No scleral icterus. Positive ecchymosis and swelling over the left orbit and including the left upper eyelid. Head: Abrasion over the left forearm head, and changes over the orbit as described above. Nose: No congestion/rhinnorhea. No asymmetry of the nose or swelling of the nose. No septal hematoma. Mouth/Throat: Mucous membranes are moist. Bilateral front teeth are chipped with no exposure of the root. No obvious malocclusion.  Neck: No stridor.  Supple.  No midline C-spine tenderness to palpation, step-offs or deformities. Cardiovascular: Normal rate, regular rhythm. No murmurs, rubs or gallops.   Respiratory: Normal respiratory effort.  No accessory muscle use or retractions. Lungs CTAB.  No wheezes, rales or ronchi. Gastrointestinal: Overweight. Soft, nontender and nondistended.  No guarding or rebound.  No peritoneal signs. Musculoskeletal: No LE edema. No ttp in the calves or palpable cords.  Negative Homan's sign. Pelvis is stable. Full range of motion of the bilateral hips, knees, ankles without pain. Full range of motion of the bilateral wrists, elbows and right shoulder without pain. Some tenderness to palpation in the proximal left humerus, with  pain with range of motion of the right shoulder. No tenderness over the left clavicle. Neurologic:  A&Ox3.  Speech is clear.  Face and smile are symmetric.  EOMI.  Moves all extremities well. Skin:  Skin is warm, dry. No rash noted. Abrasion that is 2 x 2 centimeters just below the left knee. 90 laceration over the left forehead that is a total of 3 cm in length just above the eyebrow. Psychiatric: Mood and affect are normal.  ____________________________________________   LABS (all labs ordered are listed, but only abnormal results are displayed)  Labs Reviewed  COMPREHENSIVE METABOLIC PANEL - Abnormal; Notable for the following:    Potassium 3.1 (*)    Glucose, Bld 122 (*)    Creatinine, Ser 1.11 (*)    GFR calc non Af Amer 46 (*)    GFR calc Af Amer 53 (*)    All other components within normal limits  CBC  TROPONIN I   ____________________________________________  EKG  ED ECG REPORT I, Eula Listen, the attending physician, personally viewed and interpreted this ECG.   Date: 03/01/2016  EKG Time: 1540  Rate: 73  Rhythm: normal sinus rhythm  Axis: Normal  Intervals:none  ST&T Change: No ST elevation. ____________________________________________  RADIOLOGY  Dg Chest 1 View  03/01/2016  CLINICAL DATA:  Fall. EXAM: CHEST 1 VIEW COMPARISON:  CT 02/05/2009. FINDINGS: Stable mild prominence of the azygos vein.  Mediastinum hilar structures are otherwise unremarkable. Heart size normal. Mild bibasilar subsegmental atelectasis. No pleural effusion or pneumothorax. Comminuted fracture of the left humeral head and neck. a IMPRESSION: 1.  Comminuted fracture of the left humeral head and neck. 2.  Mild bibasilar subsegmental atelectasis . Electronically Signed   By: Marcello Moores  Register   On: 03/01/2016 16:56   Ct Head Wo Contrast  03/01/2016  CLINICAL DATA:  Fall at home. Found face down on the driveway. Left eyebrow laceration. EXAM: CT HEAD WITHOUT CONTRAST CT MAXILLOFACIAL WITHOUT CONTRAST CT CERVICAL SPINE WITHOUT CONTRAST TECHNIQUE: Multidetector CT imaging of the head, cervical spine, and maxillofacial structures were performed using the standard protocol without intravenous contrast. Multiplanar CT image reconstructions of the cervical spine and maxillofacial structures were also generated. COMPARISON:  None. FINDINGS: CT HEAD FINDINGS Moderate left supraorbital scalp contusion. No evidence of parenchymal hemorrhage or extra-axial fluid collection. No mass lesion, mass effect, or midline shift. No CT evidence of acute infarction. Intracranial atherosclerosis. Nonspecific prominent subcortical and periventricular white matter hypodensity, most in keeping with chronic small vessel ischemic change. Generalized cerebral volume loss. No ventriculomegaly. No fluid levels in the visualized paranasal sinuses. Diffuse mild mucoperiosteal thickening in the bilateral ethmoidal air cells and visualized bilateral maxillary sinuses. The mastoid air cells are unopacified. No evidence of calvarial fracture. CT MAXILLOFACIAL FINDINGS Moderate left supraorbital scalp contusion. Extraconal soft tissue swelling in the bilateral orbits. Globes appear intact. No intraconal left orbital hematoma. Asymmetric prominent enlargement of the right inferior rectus muscle. No maxillofacial fracture. The maxilla and mandible appear intact. The nasal  bones are unremarkable in appearance. No dislocation at the temporomandibular joints. The visualized dentition demonstrates no acute abnormality. No fluid levels in the paranasal sinuses. Symmetric mild mucoperiosteal thickening in the bilateral ethmoidal air cells and bilateral maxillary sinuses. The visualized mastoid air cells are unopacified. No aggressive appearing focal osseous lesions. No extra-axial collections, intracranial hemorrhage or mass effect in the visualized intracranial regions. The parapharyngeal fat planes are preserved. The nasopharynx, oropharynx and hypopharynx are unremarkable in appearance. The parotid and submandibular glands  are within normal limits. No cervical lymphadenopathy is seen. CT CERVICAL SPINE FINDINGS No fracture is detected in the cervical spine. No prevertebral soft tissue swelling. There is straightening of the cervical spine, usually due to positioning and/or muscle spasm. Dens is well positioned between the lateral masses of C1. The lateral masses appear well-aligned. Moderate degenerative disc disease throughout the mid to lower cervical spine. Mild-to-moderate facet arthropathy bilaterally. Mild degenerative foraminal stenosis on the left at C3-4. Moderate degenerative foraminal stenosis on the right at C4-5. Moderate bilateral degenerative foraminal stenosis at C5-6. Mild degenerate foraminal stenosis on the right at C7-T1. Minimal 2 mm anterolisthesis at C4-5. Visualized mastoid air cells appear clear. No evidence of intra-axial hemorrhage in the visualized brain. No gross cervical canal hematoma. No significant pulmonary nodules at the visualized lung apices. Mild multinodular goiter with dominant hypodense 2.1 cm right thyroid lobe nodule. No cervical adenopathy or other significant neck soft tissue abnormality. IMPRESSION: 1. Moderate left supraorbital scalp contusion. No evidence of acute intracranial abnormality. No evidence of calvarial fracture. 2. No  maxillofacial fracture. 3. Extraconal soft tissue swelling in the bilateral orbits, probably representing superficial orbital contusions. Nonspecific prominent enlargement of the right inferior rectus muscle, which could represent an intramuscular extraocular muscle hematoma, although other etiologies such as thyroid ophthalmopathy (patient has multinodular goiter) or breast cancer metastasis (patient has history of breast cancer) cannot be excluded. 4. Prominent chronic small vessel ischemia. 5. No cervical spine fracture. 6. Moderate degenerative changes in mid to lower cervical spine as described. Minimal 2 mm anterolisthesis at C4-5 is probably degenerative. Electronically Signed   By: Ilona Sorrel M.D.   On: 03/01/2016 17:35   Ct Cervical Spine Wo Contrast  03/01/2016  CLINICAL DATA:  Fall at home. Found face down on the driveway. Left eyebrow laceration. EXAM: CT HEAD WITHOUT CONTRAST CT MAXILLOFACIAL WITHOUT CONTRAST CT CERVICAL SPINE WITHOUT CONTRAST TECHNIQUE: Multidetector CT imaging of the head, cervical spine, and maxillofacial structures were performed using the standard protocol without intravenous contrast. Multiplanar CT image reconstructions of the cervical spine and maxillofacial structures were also generated. COMPARISON:  None. FINDINGS: CT HEAD FINDINGS Moderate left supraorbital scalp contusion. No evidence of parenchymal hemorrhage or extra-axial fluid collection. No mass lesion, mass effect, or midline shift. No CT evidence of acute infarction. Intracranial atherosclerosis. Nonspecific prominent subcortical and periventricular white matter hypodensity, most in keeping with chronic small vessel ischemic change. Generalized cerebral volume loss. No ventriculomegaly. No fluid levels in the visualized paranasal sinuses. Diffuse mild mucoperiosteal thickening in the bilateral ethmoidal air cells and visualized bilateral maxillary sinuses. The mastoid air cells are unopacified. No evidence of  calvarial fracture. CT MAXILLOFACIAL FINDINGS Moderate left supraorbital scalp contusion. Extraconal soft tissue swelling in the bilateral orbits. Globes appear intact. No intraconal left orbital hematoma. Asymmetric prominent enlargement of the right inferior rectus muscle. No maxillofacial fracture. The maxilla and mandible appear intact. The nasal bones are unremarkable in appearance. No dislocation at the temporomandibular joints. The visualized dentition demonstrates no acute abnormality. No fluid levels in the paranasal sinuses. Symmetric mild mucoperiosteal thickening in the bilateral ethmoidal air cells and bilateral maxillary sinuses. The visualized mastoid air cells are unopacified. No aggressive appearing focal osseous lesions. No extra-axial collections, intracranial hemorrhage or mass effect in the visualized intracranial regions. The parapharyngeal fat planes are preserved. The nasopharynx, oropharynx and hypopharynx are unremarkable in appearance. The parotid and submandibular glands are within normal limits. No cervical lymphadenopathy is seen. CT CERVICAL SPINE FINDINGS No fracture is detected  in the cervical spine. No prevertebral soft tissue swelling. There is straightening of the cervical spine, usually due to positioning and/or muscle spasm. Dens is well positioned between the lateral masses of C1. The lateral masses appear well-aligned. Moderate degenerative disc disease throughout the mid to lower cervical spine. Mild-to-moderate facet arthropathy bilaterally. Mild degenerative foraminal stenosis on the left at C3-4. Moderate degenerative foraminal stenosis on the right at C4-5. Moderate bilateral degenerative foraminal stenosis at C5-6. Mild degenerate foraminal stenosis on the right at C7-T1. Minimal 2 mm anterolisthesis at C4-5. Visualized mastoid air cells appear clear. No evidence of intra-axial hemorrhage in the visualized brain. No gross cervical canal hematoma. No significant pulmonary  nodules at the visualized lung apices. Mild multinodular goiter with dominant hypodense 2.1 cm right thyroid lobe nodule. No cervical adenopathy or other significant neck soft tissue abnormality. IMPRESSION: 1. Moderate left supraorbital scalp contusion. No evidence of acute intracranial abnormality. No evidence of calvarial fracture. 2. No maxillofacial fracture. 3. Extraconal soft tissue swelling in the bilateral orbits, probably representing superficial orbital contusions. Nonspecific prominent enlargement of the right inferior rectus muscle, which could represent an intramuscular extraocular muscle hematoma, although other etiologies such as thyroid ophthalmopathy (patient has multinodular goiter) or breast cancer metastasis (patient has history of breast cancer) cannot be excluded. 4. Prominent chronic small vessel ischemia. 5. No cervical spine fracture. 6. Moderate degenerative changes in mid to lower cervical spine as described. Minimal 2 mm anterolisthesis at C4-5 is probably degenerative. Electronically Signed   By: Ilona Sorrel M.D.   On: 03/01/2016 17:35   Dg Humerus Left  03/01/2016  CLINICAL DATA:  Pt fell today from standing face first onto concrete driveway, c/o left shoulder pain/limited ROM, no prev surg, hx left breast cancer with mastoplasty, never a smoker EXAM: LEFT HUMERUS - 2+ VIEW COMPARISON:  None. FINDINGS: There is a fracture the proximal humerus. There is a fracture across the proximal metaphysis with an additional fracture across the base of the greater tuberosity. Fracture is mildly displaced with the major components displaced proximally 1 cm. There is no dislocation. Bones are demineralized.  There is proximal soft tissue swelling. IMPRESSION: Mildly comminuted and displaced fracture of the proximal left humerus. No dislocation. Electronically Signed   By: Lajean Manes M.D.   On: 03/01/2016 16:54   Ct Maxillofacial Wo Cm  03/01/2016  CLINICAL DATA:  Fall at home. Found face  down on the driveway. Left eyebrow laceration. EXAM: CT HEAD WITHOUT CONTRAST CT MAXILLOFACIAL WITHOUT CONTRAST CT CERVICAL SPINE WITHOUT CONTRAST TECHNIQUE: Multidetector CT imaging of the head, cervical spine, and maxillofacial structures were performed using the standard protocol without intravenous contrast. Multiplanar CT image reconstructions of the cervical spine and maxillofacial structures were also generated. COMPARISON:  None. FINDINGS: CT HEAD FINDINGS Moderate left supraorbital scalp contusion. No evidence of parenchymal hemorrhage or extra-axial fluid collection. No mass lesion, mass effect, or midline shift. No CT evidence of acute infarction. Intracranial atherosclerosis. Nonspecific prominent subcortical and periventricular white matter hypodensity, most in keeping with chronic small vessel ischemic change. Generalized cerebral volume loss. No ventriculomegaly. No fluid levels in the visualized paranasal sinuses. Diffuse mild mucoperiosteal thickening in the bilateral ethmoidal air cells and visualized bilateral maxillary sinuses. The mastoid air cells are unopacified. No evidence of calvarial fracture. CT MAXILLOFACIAL FINDINGS Moderate left supraorbital scalp contusion. Extraconal soft tissue swelling in the bilateral orbits. Globes appear intact. No intraconal left orbital hematoma. Asymmetric prominent enlargement of the right inferior rectus muscle. No maxillofacial fracture. The  maxilla and mandible appear intact. The nasal bones are unremarkable in appearance. No dislocation at the temporomandibular joints. The visualized dentition demonstrates no acute abnormality. No fluid levels in the paranasal sinuses. Symmetric mild mucoperiosteal thickening in the bilateral ethmoidal air cells and bilateral maxillary sinuses. The visualized mastoid air cells are unopacified. No aggressive appearing focal osseous lesions. No extra-axial collections, intracranial hemorrhage or mass effect in the  visualized intracranial regions. The parapharyngeal fat planes are preserved. The nasopharynx, oropharynx and hypopharynx are unremarkable in appearance. The parotid and submandibular glands are within normal limits. No cervical lymphadenopathy is seen. CT CERVICAL SPINE FINDINGS No fracture is detected in the cervical spine. No prevertebral soft tissue swelling. There is straightening of the cervical spine, usually due to positioning and/or muscle spasm. Dens is well positioned between the lateral masses of C1. The lateral masses appear well-aligned. Moderate degenerative disc disease throughout the mid to lower cervical spine. Mild-to-moderate facet arthropathy bilaterally. Mild degenerative foraminal stenosis on the left at C3-4. Moderate degenerative foraminal stenosis on the right at C4-5. Moderate bilateral degenerative foraminal stenosis at C5-6. Mild degenerate foraminal stenosis on the right at C7-T1. Minimal 2 mm anterolisthesis at C4-5. Visualized mastoid air cells appear clear. No evidence of intra-axial hemorrhage in the visualized brain. No gross cervical canal hematoma. No significant pulmonary nodules at the visualized lung apices. Mild multinodular goiter with dominant hypodense 2.1 cm right thyroid lobe nodule. No cervical adenopathy or other significant neck soft tissue abnormality. IMPRESSION: 1. Moderate left supraorbital scalp contusion. No evidence of acute intracranial abnormality. No evidence of calvarial fracture. 2. No maxillofacial fracture. 3. Extraconal soft tissue swelling in the bilateral orbits, probably representing superficial orbital contusions. Nonspecific prominent enlargement of the right inferior rectus muscle, which could represent an intramuscular extraocular muscle hematoma, although other etiologies such as thyroid ophthalmopathy (patient has multinodular goiter) or breast cancer metastasis (patient has history of breast cancer) cannot be excluded. 4. Prominent chronic  small vessel ischemia. 5. No cervical spine fracture. 6. Moderate degenerative changes in mid to lower cervical spine as described. Minimal 2 mm anterolisthesis at C4-5 is probably degenerative. Electronically Signed   By: Ilona Sorrel M.D.   On: 03/01/2016 17:35    ____________________________________________   PROCEDURES  Procedure(s) performed: None  Critical Care performed: No ____________________________________________   INITIAL IMPRESSION / ASSESSMENT AND PLAN / ED COURSE  Pertinent labs & imaging results that were available during my care of the patient were reviewed by me and considered in my medical decision making (see chart for details).  82 y.o. female status post fall with obvious injury over the left side of the head and orbit, but no obvious eye injury. The patient also has pain in the left upper extremity. I will evaluate the patient for any intracranial injury, facial bone injury, or C-spine injury. We will also get a x-ray of the left shoulder and humerus. After she returns, we will spend more time reevaluating her dental baseline, as well as the exact mechanism and cause of her fall. The patient does not endorse a syncopal episode but is unable to describe what happened today.  ----------------------------------------- 6:20 PM on 03/01/2016 -----------------------------------------  The patient's workup here today shows a left proximal humeral fracture, but the patient does not have any acute intracranial abnormality or broken facial bones. Her cervical spine also does not show any acute fractures. We'll plan to repair her laceration, and discharge her home. She will need a dental follow-up for her.  ----------------------------------------- 6:34  PM on 03/01/2016 -----------------------------------------  I have spoken with Dr. Marry Guan, who recommends shoulder immobilizer w/ f/u 1 wk.  ----------------------------------------- 6:55 PM on  03/01/2016 -----------------------------------------  The patient is feeling somewhat better, but does continue to have persistent nausea. She has had 2 doses of Zofran, and a dose of Reglan. I will give her another dose of Reglan and reevaluate her. We have attempted ginger ale to settle her stomach, but this only worsen her nausea.  I have talked to the patient and her family once more about the etiology of her fall. It sounds like she has chronic imbalance, and was leaning over to feed her cat when she lost her balance, striking her forehead. There is no history that is suggestive of syncope today.  I have repaired the laceration over her left forehead, and we will plan to continue antibiotics, and a by mouth challenge to make sure that the patient is able to tolerate liquid. In addition, she will need to show [place] that she is able to stand up and walk safely in order to be discharged. I have talked to the family about whether a walker might be beneficial to the patient, and they all agree that is more likely to get caught up with her and cause more falls.  LACERATION REPAIR Performed by: Eula Listen Authorized by: Eula Listen Consent: Verbal consent obtained. Risks and benefits: risks, benefits and alternatives were discussed Consent given by: patient Patient identity confirmed: provided demographic data Prepped and Draped in normal sterile fashion Wound explored  Laceration Location: left forehead  Laceration Length: 3cm  No Foreign Bodies seen or palpated  Anesthesia: local infiltration  Local anesthetic: lidocaine 1% with epinephrine  Anesthetic total: 3 ml  Irrigation method: syringe Amount of cleaning: standard  Skin closure: 6-0 prolene  Number of sutures: 5  Technique: simple interrupted  Patient tolerance: Patient tolerated the procedure well with no immediate complications.  ____________________________________________  FINAL CLINICAL  IMPRESSION(S) / ED DIAGNOSES  Final diagnoses:  Hypokalemia  Left humeral fracture, closed, initial encounter  Broken teeth, closed, initial encounter  Contusion of left orbit, initial encounter  Laceration of forehead, initial encounter      NEW MEDICATIONS STARTED DURING THIS VISIT:  New Prescriptions   ONDANSETRON (ZOFRAN ODT) 4 MG DISINTEGRATING TABLET    Take 1 tablet (4 mg total) by mouth every 8 (eight) hours as needed for nausea or vomiting.   OXYCODONE (ROXICODONE) 5 MG IMMEDIATE RELEASE TABLET    Take 1 tablet (5 mg total) by mouth every 6 (six) hours as needed for severe pain.     Eula Listen, MD 03/01/16 1901

## 2016-03-01 NOTE — ED Notes (Signed)
Patient taken off oxygen. SpO2 100%. Tolerating well.

## 2016-03-01 NOTE — ED Notes (Signed)
Patient ambulated to commode with 2 person assist. Patient tolerated well.

## 2016-03-01 NOTE — ED Notes (Signed)
Patient given saltine crackers and cola. Tolerated well. Patient states she still feels nauseous but is feeling better than before. Patient is alert with right eye open. Speaking and asking questions.

## 2016-03-01 NOTE — ED Notes (Signed)
Per EMS patient comes from home due to a fall. Patient was found face down at the top of her drive way. Patient vomited 6 times in route. EMS established IV access and gave 100 mcg of fentanyl. CBG 113. Dr. Mariea Clonts at bedside assessing patient. Patient is alert. Patient is A&O x3. Disoriented to situation. Pupils 104mm PERRLA. Patient has laceration noted to left eyebrow, bleeding controlled. Patient c/o left shoulder pain. SpO2 89% patient placed on 4L [place], up to 94%.

## 2016-03-01 NOTE — ED Notes (Signed)
Patient resting at this time. NSR on monitor. Even and non-labored respirations noted.

## 2016-03-01 NOTE — Discharge Instructions (Signed)
Please keep your arm in the shoulder immobilizer or the shoulder sling at all times until you're cleared by the orthopedic doctor. Please apply ice for 15 minutes every 2 hours to your left upper arm to decrease swelling and pain. For mild to moderate pain, you may take Tylenol. For severe pain, please take oxycodone but know that this medication can make you sleepy or unsteady on your feet. Please take all fall precautions.  Return to the emergency department if you develop severe pain, inability to walk, vomiting, or any other symptoms concerning to you.  Contusion A contusion is a deep bruise. Contusions are the result of a blunt injury to tissues and muscle fibers under the skin. The injury causes bleeding under the skin. The skin overlying the contusion may turn blue, purple, or yellow. Minor injuries will give you a painless contusion, but more severe contusions may stay painful and swollen for a few weeks.  CAUSES  This condition is usually caused by a blow, trauma, or direct force to an area of the body. SYMPTOMS  Symptoms of this condition include:  Swelling of the injured area.  Pain and tenderness in the injured area.  Discoloration. The area may have redness and then turn blue, purple, or yellow. DIAGNOSIS  This condition is diagnosed based on a physical exam and medical history. An X-ray, CT scan, or MRI may be needed to determine if there are any associated injuries, such as broken bones (fractures). TREATMENT  Specific treatment for this condition depends on what area of the body was injured. In general, the best treatment for a contusion is resting, icing, applying pressure to (compression), and elevating the injured area. This is often called the RICE strategy. Over-the-counter anti-inflammatory medicines may also be recommended for pain control.  HOME CARE INSTRUCTIONS   Rest the injured area.  If directed, apply ice to the injured area:  Put ice in a plastic bag.  Place  a towel between your skin and the bag.  Leave the ice on for 20 minutes, 2-3 times per day.  If directed, apply light compression to the injured area using an elastic bandage. Make sure the bandage is not wrapped too tightly. Remove and reapply the bandage as directed by your health care provider.  If possible, raise (elevate) the injured area above the level of your heart while you are sitting or lying down.  Take over-the-counter and prescription medicines only as told by your health care provider. SEEK MEDICAL CARE IF:  Your symptoms do not improve after several days of treatment.  Your symptoms get worse.  You have difficulty moving the injured area. SEEK IMMEDIATE MEDICAL CARE IF:   You have severe pain.  You have numbness in a hand or foot.  Your hand or foot turns pale or cold.   This information is not intended to replace advice given to you by your health care provider. Make sure you discuss any questions you have with your health care provider.   Document Released: 07/14/2005 Document Revised: 06/25/2015 Document Reviewed: 02/19/2015 Elsevier Interactive Patient Education [employer].

## 2016-03-01 NOTE — ED Notes (Signed)
Patient transported to CT 

## 2016-03-01 NOTE — ED Notes (Signed)
Patient turned down to 2L [place].

## 2016-03-02 ENCOUNTER — Telehealth: Payer: Self-pay

## 2016-03-02 ENCOUNTER — Emergency Department: Payer: PPO

## 2016-03-02 ENCOUNTER — Emergency Department
Admission: EM | Admit: 2016-03-02 | Discharge: 2016-03-03 | Disposition: A | Discharge status: Home / Self Care | Payer: PPO | Attending: Emergency Medicine | Admitting: Emergency Medicine

## 2016-03-02 DIAGNOSIS — W19XXXS Unspecified fall, sequela: Secondary | ICD-10-CM | POA: Diagnosis not present

## 2016-03-02 DIAGNOSIS — R112 Nausea with vomiting, unspecified: Secondary | ICD-10-CM | POA: Diagnosis not present

## 2016-03-02 DIAGNOSIS — M6281 Muscle weakness (generalized): Secondary | ICD-10-CM | POA: Insufficient documentation

## 2016-03-02 DIAGNOSIS — Z853 Personal history of malignant neoplasm of breast: Secondary | ICD-10-CM | POA: Diagnosis not present

## 2016-03-02 DIAGNOSIS — S4292XS Fracture of left shoulder girdle, part unspecified, sequela: Secondary | ICD-10-CM | POA: Insufficient documentation

## 2016-03-02 DIAGNOSIS — R1111 Vomiting without nausea: Secondary | ICD-10-CM

## 2016-03-02 DIAGNOSIS — R1110 Vomiting, unspecified: Secondary | ICD-10-CM | POA: Diagnosis not present

## 2016-03-02 DIAGNOSIS — R079 Chest pain, unspecified: Secondary | ICD-10-CM | POA: Diagnosis not present

## 2016-03-02 DIAGNOSIS — R531 Weakness: Secondary | ICD-10-CM

## 2016-03-02 LAB — CBC WITH DIFFERENTIAL/PLATELET
BASOS ABS: 0 10*3/uL (ref 0–0.1)
EOS ABS: 0 10*3/uL (ref 0–0.7)
HCT: 42.7 % (ref 35.0–47.0)
HEMOGLOBIN: 14.1 g/dL (ref 12.0–16.0)
Lymphs Abs: 1.1 10*3/uL (ref 1.0–3.6)
MCH: 31.3 pg (ref 26.0–34.0)
MCHC: 33.2 g/dL (ref 32.0–36.0)
MCV: 94.2 fL (ref 80.0–100.0)
MONO ABS: 0.9 10*3/uL (ref 0.2–0.9)
Monocytes Relative: 8 %
Neutro Abs: 9.2 10*3/uL — ABNORMAL HIGH (ref 1.4–6.5)
Neutrophils Relative %: 82 %
Platelets: 199 10*3/uL (ref 150–440)
RBC: 4.53 MIL/uL (ref 3.80–5.20)
RDW: 13.2 % (ref 11.5–14.5)
WBC: 11.2 10*3/uL — AB (ref 3.6–11.0)

## 2016-03-02 LAB — COMPREHENSIVE METABOLIC PANEL
ALT: 31 U/L (ref 14–54)
AST: 33 U/L (ref 15–41)
Albumin: 4 g/dL (ref 3.5–5.0)
Alkaline Phosphatase: 44 U/L (ref 38–126)
Anion gap: 7 (ref 5–15)
BILIRUBIN TOTAL: 0.8 mg/dL (ref 0.3–1.2)
BUN: 16 mg/dL (ref 6–20)
CALCIUM: 9.4 mg/dL (ref 8.9–10.3)
CHLORIDE: 108 mmol/L (ref 101–111)
CO2: 22 mmol/L (ref 22–32)
CREATININE: 0.99 mg/dL (ref 0.44–1.00)
GFR, EST NON AFRICAN AMERICAN: 53 mL/min — AB (ref >60)
Glucose, Bld: 130 mg/dL — ABNORMAL HIGH (ref 65–99)
Potassium: 3.9 mmol/L (ref 3.5–5.1)
Sodium: 137 mmol/L (ref 135–145)
TOTAL PROTEIN: 7.1 g/dL (ref 6.5–8.1)

## 2016-03-02 LAB — URINALYSIS COMPLETE WITH MICROSCOPIC (ARMC ONLY)
BILIRUBIN URINE: NEGATIVE
Bacteria, UA: NONE SEEN
Glucose, UA: NEGATIVE mg/dL
LEUKOCYTES UA: NEGATIVE
Nitrite: NEGATIVE
PH: 6 (ref 5.0–8.0)
Protein, ur: NEGATIVE mg/dL
Specific Gravity, Urine: 1.011 (ref 1.005–1.030)

## 2016-03-02 LAB — LIPASE, BLOOD: LIPASE: 41 U/L (ref 11–51)

## 2016-03-02 LAB — TROPONIN I

## 2016-03-02 MED ORDER — SODIUM CHLORIDE 0.9 % IV BOLUS (SEPSIS)
1000.0000 mL | Freq: Once | INTRAVENOUS | Status: AC
Start: 2016-03-02 — End: 2016-03-02
  Administered 2016-03-02: 1000 mL via INTRAVENOUS

## 2016-03-02 MED ORDER — OXYCODONE HCL 5 MG PO TABS
5.0000 mg | ORAL_TABLET | Freq: Once | ORAL | Status: AC
  Administered 2016-03-02: 5 mg via ORAL
  Filled 2016-03-02: qty 1

## 2016-03-02 NOTE — Telephone Encounter (Signed)
Pt husband had called our nurse line and was concerned about Kayah continuing to be lightheaded,dizzy and vomiting. Wanted for something other than zofran to be called in but nurse was concerned about phenergan making her drowsy with current symptoms. We both agreed that a trip to the ER or an urgent care was best considering symptoms. Pt husband was informed of our decision.

## 2016-03-02 NOTE — ED Notes (Signed)
Patient transported to X-ray 

## 2016-03-02 NOTE — ED Notes (Signed)
Pt from home via EMS, was here yesterday for fall, fractured humeral head. Presents now with N/V. EMS reports dark colored emesis

## 2016-03-02 NOTE — ED Notes (Signed)
MD at bedside. 

## 2016-03-02 NOTE — ED Notes (Signed)
Pt given hospital bed for comfort.  

## 2016-03-02 NOTE — ED Notes (Signed)
Pt reports unable to urinate since being D/C yesterday, While obtaining UA pt relieved of 674ml of urine

## 2016-03-02 NOTE — ED Notes (Signed)
Pt awake, requesting some food. Applesauce given at this time. Pt denies any pain or nausea, just reports weakness. EDP informed of pts tachycardia upon waking

## 2016-03-02 NOTE — ED Provider Notes (Signed)
[facility] Emergency Department Provider Note   ____________________________________________  Time seen:  I have reviewed the triage vital signs and the triage nursing note.  HISTORY  Chief Complaint Emesis   Historian Patient  HPI Kristina Simmons is a 82 y.o. female who was seen in the emergency department last night after a fall where she sustained facial contusion, facial laceration, as well as left humeral head fracture. Since she's been at home she has been unable to get up on her own and her husband is elderly and unable to care for her on his own. Family is requesting acute care rehabilitation placement.  Additionally the patient has really been unable to take by mouth as she has been nausea needed. She has vomited several times, nonbloody. She is denying headache or confusion, nor altered mental status.  No trouble breathing or chest pain. She does have chronic fibromyalgia pain all over and she is having some tonic pain as well as acute pain from her left shoulder. Her last pain pill was this morning.  Denies abdominal pain or urinary symptoms. Denies changes in bowel habits. Symptoms are moderate.  They state they're unsure how she actually fell and the patient had amnesia related to the event and was unable to state what happened. It sounds like it was assumed to be a mechanical fall. She did not have nausea and vomiting until after the fall. She's had a recent falls in the recent past. He thinks that all these symptoms have happened after treatment for breast cancer with tamoxifen.    Past Medical History  Diagnosis Date  . Personal history of malignant neoplasm of breast 2010    left breast cancer; status post wide excision with mastoplasty followed by whole breast radiation and tamoxifen  . Fibromyalgia 2011  . Unspecified essential hypertension   . Sleep apnea 2006  . Arthritis   . Kidney stones   . Breast screening, unspecified   .  Special screening for malignant neoplasms, colon   . Undiagnosed cardiac murmurs   . Tremor 2011  . Bursitis     left hip  . Malignant neoplasm of upper-outer quadrant of female breast ([place])     DCIS; diagnosed on January 15, 2009. The patient had originally undergone a stereotactic biopsy for microcalcifications. Atypical hyperplasia was identified. Wide excision was completed to confirm the presence or absence of DCIS or invasive tumor. The patient had a 0.5 cm foci of DCIS adjacent to the previous biopsy site. This was a grade 1 lesion with focal necrosis. ER 90%, PR 50%     Patient Active Problem List   Diagnosis Date Noted  . Has a tremor 05/27/2015  . Breast cancer, left breast ([place]) 01/15/2009    Past Surgical History  Procedure Laterality Date  . Abdominal hysterectomy  1974  . Colonoscopy  2004  . Lithotripsy  2008  . Appendectomy  1974  . Upper gi endoscopy  May 28, 2010    Biopsy showed evidence of mild chronic gastritis. There was no evidence of H. Pylori infection. A normal duodenum was reported.   . Breast surgery Left 2010    left breast wide excision mastoplasty  . Status post radiation therapy Left     breast cancer  . Breast excisional biopsy Left 2010    breast cancer    Current Outpatient Rx  Name  Route  Sig  Dispense  Refill  . amLODipine (NORVASC) 10 MG tablet   Oral  Take 10 mg by mouth every other day.         . ibuprofen (ADVIL,MOTRIN) 200 MG tablet   Oral   Take 800 mg by mouth every 6 (six) hours as needed for headache or mild pain.         . metoprolol succinate (TOPROL-XL) 25 MG 24 hr tablet   Oral   Take 25 mg by mouth daily.         . ondansetron (ZOFRAN ODT) 4 MG disintegrating tablet   Oral   Take 1 tablet (4 mg total) by mouth every 8 (eight) hours as needed for nausea or vomiting.   20 tablet   0   . oxyCODONE (ROXICODONE) 5 MG immediate release tablet   Oral   Take 1 tablet (5 mg total) by mouth every 6 (six) hours as  needed for severe pain.   15 tablet   0   . ranitidine (ZANTAC) 300 MG tablet   Oral   Take 300 mg by mouth 2 (two) times daily as needed for heartburn.            Allergies Review of patient's allergies indicates no known allergies.  No family history on file.  Social History Social History  Substance Use Topics  . Smoking status: Never Smoker   . Smokeless tobacco: None  . Alcohol Use: No    Review of Systems  Constitutional: Negative for fever. Eyes: Left eye lid swollen shut with ecchymosis ENT: Negative for sore throat. Cardiovascular: Negative for chest pain. Respiratory: Negative for shortness of breath. Gastrointestinal: No abdominal pain or diarrhea. Positive for vomiting as per history of present illness Genitourinary: Negative for dysuria. Musculoskeletal: Negative for back pain. Skin: Negative for rash. Neurological: Negative for headache. 10 point Review of Systems otherwise negative ____________________________________________   PHYSICAL EXAM:  VITAL SIGNS: ED Triage Vitals  Enc Vitals Group     BP 03/02/16 1557 161/84 mmHg     Pulse Rate 03/02/16 1557 89     Resp 03/02/16 1557 15     Temp 03/02/16 1559 98.4 F (36.9 C)     Temp Source 03/02/16 1559 Oral     SpO2 03/02/16 1557 96 %     Weight --      Height --      Head Cir --      Peak Flow --      Pain Score 03/02/16 1550 8     Pain Loc --      Pain Edu? --      Excl. in [place]? --      Constitutional: Alert and oriented. Well appearing Overall and in no distress. HEENT   Head: Normocephalic.  Left eyebrow ecchymosis and black eye. Stitches intact to the left forehead. Abrasion left forehead.      Eyes: Conjunctivae are normal. PERRL. Normal extraocular movements.      Ears:         Nose: No congestion/rhinnorhea.   Mouth/Throat: Mucous membranes are mildly dry.   Neck: No stridor. Cardiovascular/Chest: Normal rate, regular rhythm.  No murmurs, rubs, or  gallops. Respiratory: Normal respiratory effort without tachypnea nor retractions. Breath sounds are clear and equal bilaterally. No wheezes/rales/rhonchi. Gastrointestinal: Soft. No distention, no guarding, no rebound. Nontender.    Genitourinary/rectal:Deferred Musculoskeletal: Left shoulder sling. Neurovascularly intact extremities. Neurologic:  Normal speech and language. No gross or focal neurologic deficits are appreciated. Skin:  Skin is warm, dry and intact. No rash noted. Psychiatric: Mood and affect  are normal. Speech and behavior are normal. Patient exhibits appropriate insight and judgment.  ____________________________________________   EKG I, Lisa Roca, MD, the attending physician have personally viewed and interpreted all ECGs.  94 bpm. Normal sinus rhythm. Narrow QRS. Normal axis. Nonspecific ST and T-wave ____________________________________________  LABS (pertinent positives/negatives)  Labs Reviewed  COMPREHENSIVE METABOLIC PANEL - Abnormal; Notable for the following:    Glucose, Bld 130 (*)    GFR calc non Af Amer 53 (*)    All other components within normal limits  CBC WITH DIFFERENTIAL/PLATELET - Abnormal; Notable for the following:    WBC 11.2 (*)    Neutro Abs 9.2 (*)    All other components within normal limits  URINALYSIS COMPLETEWITH MICROSCOPIC ([facility] ONLY) - Abnormal; Notable for the following:    Color, Urine YELLOW (*)    APPearance HAZY (*)    Ketones, ur TRACE (*)    Hgb urine dipstick 1+ (*)    Squamous Epithelial / LPF 0-5 (*)    All other components within normal limits  LIPASE, BLOOD  TROPONIN I     ____________________________________________  RADIOLOGY All Xrays were viewed by me. Imaging interpreted by Radiologist.  Chest abdomen:  IMPRESSION: Comminuted left humeral fracture.  No other focal abnormality is seen. __________________________________________  PROCEDURES  Procedure(s) performed: None  Critical Care  performed: None  ____________________________________________   ED COURSE / ASSESSMENT AND PLAN  Pertinent labs & imaging results that were available during my care of the patient were reviewed by me and considered in my medical decision making (see chart for details).   History this patient back because she is unable to be cared for at home after acute left shoulder injury as well as facial contusion with black eye and generalized pain after fall yesterday. She is also having decreased by mouth intake due to vomiting.  I think the vomiting is either postconcussive versus side effect to the oxycodone pain medication the patient was discharged with for her fracture.  She's not having any abdominal pain or diarrhea to suggest intra-abdominal cause such as obstruction or gastroenteritis.  She is overall well-appearing but is unable to get up on account of pain, and will need postacute care rehabilitation.  She doesn't appear to need inpatient hospitalization or observation. I will contact social work to work on placement from the ED.  I ordered a hospital stretcher bed for the patient. I updated the family as well as the patient.  Patient care transferred to ED physician at shift change, Dr. Jimmye Norman and likely to Dr. Owens Shark overnight.    CONSULTATIONS:   Education officer, museum for disposition placement.   Patient / Family / Caregiver informed of clinical course, medical decision-making process, and agree with plan.     ___________________________________________   FINAL CLINICAL IMPRESSION(S) / ED DIAGNOSES   Final diagnoses:  Non-intractable vomiting without nausea, vomiting of unspecified type  Generalized weakness  Fracture of left shoulder, sequela              Note: This dictation was prepared with Dragon dictation. Any transcriptional errors that result from this process are unintentional   Lisa Roca, MD 03/02/16 1900

## 2016-03-03 DIAGNOSIS — M797 Fibromyalgia: Secondary | ICD-10-CM | POA: Diagnosis not present

## 2016-03-03 DIAGNOSIS — S0993XD Unspecified injury of face, subsequent encounter: Secondary | ICD-10-CM | POA: Diagnosis not present

## 2016-03-03 DIAGNOSIS — G4730 Sleep apnea, unspecified: Secondary | ICD-10-CM | POA: Diagnosis not present

## 2016-03-03 DIAGNOSIS — S42202A Unspecified fracture of upper end of left humerus, initial encounter for closed fracture: Secondary | ICD-10-CM | POA: Diagnosis not present

## 2016-03-03 DIAGNOSIS — M6281 Muscle weakness (generalized): Secondary | ICD-10-CM | POA: Diagnosis not present

## 2016-03-03 DIAGNOSIS — W19XXXA Unspecified fall, initial encounter: Secondary | ICD-10-CM | POA: Diagnosis not present

## 2016-03-03 DIAGNOSIS — R12 Heartburn: Secondary | ICD-10-CM | POA: Diagnosis not present

## 2016-03-03 DIAGNOSIS — R6889 Other general symptoms and signs: Secondary | ICD-10-CM | POA: Diagnosis not present

## 2016-03-03 DIAGNOSIS — W19XXXD Unspecified fall, subsequent encounter: Secondary | ICD-10-CM | POA: Diagnosis not present

## 2016-03-03 DIAGNOSIS — Z853 Personal history of malignant neoplasm of breast: Secondary | ICD-10-CM | POA: Diagnosis not present

## 2016-03-03 DIAGNOSIS — S0093XD Contusion of unspecified part of head, subsequent encounter: Secondary | ICD-10-CM | POA: Diagnosis not present

## 2016-03-03 DIAGNOSIS — M1990 Unspecified osteoarthritis, unspecified site: Secondary | ICD-10-CM | POA: Diagnosis not present

## 2016-03-03 DIAGNOSIS — S0012XD Contusion of left eyelid and periocular area, subsequent encounter: Secondary | ICD-10-CM | POA: Diagnosis not present

## 2016-03-03 DIAGNOSIS — M79602 Pain in left arm: Secondary | ICD-10-CM | POA: Diagnosis not present

## 2016-03-03 DIAGNOSIS — Z7409 Other reduced mobility: Secondary | ICD-10-CM | POA: Diagnosis not present

## 2016-03-03 DIAGNOSIS — M810 Age-related osteoporosis without current pathological fracture: Secondary | ICD-10-CM | POA: Diagnosis not present

## 2016-03-03 DIAGNOSIS — I10 Essential (primary) hypertension: Secondary | ICD-10-CM | POA: Diagnosis not present

## 2016-03-03 DIAGNOSIS — S42302D Unspecified fracture of shaft of humerus, left arm, subsequent encounter for fracture with routine healing: Secondary | ICD-10-CM | POA: Diagnosis not present

## 2016-03-03 DIAGNOSIS — R296 Repeated falls: Secondary | ICD-10-CM | POA: Diagnosis not present

## 2016-03-03 MED ORDER — OXYCODONE HCL 5 MG PO TABS
5.0000 mg | ORAL_TABLET | ORAL | Status: AC
  Administered 2016-03-03: 5 mg via ORAL
  Filled 2016-03-03: qty 1

## 2016-03-03 MED ORDER — OXYCODONE HCL 5 MG PO TABS
ORAL_TABLET | ORAL | Status: AC
  Administered 2016-03-03: 5 mg via ORAL
  Filled 2016-03-03: qty 1

## 2016-03-03 MED ORDER — OXYCODONE HCL 5 MG PO TABS
5.0000 mg | ORAL_TABLET | ORAL | Status: AC
  Administered 2016-03-03: 5 mg via ORAL

## 2016-03-03 MED ORDER — OXYCODONE HCL 5 MG PO TABS: 5.0000 mg | ORAL_TABLET | Freq: Four times a day (QID) | ORAL | Status: DC | PRN

## 2016-03-03 NOTE — ED Notes (Signed)
Attempted to call report to [facility], no staff answered

## 2016-03-03 NOTE — ED Notes (Signed)
Nellie RN attempted to call report, unable to reach staff.

## 2016-03-03 NOTE — ED Notes (Signed)
PT given breakfast. 

## 2016-03-03 NOTE — ED Notes (Signed)
Pts brief and bedding changed, pt repositioned, delay explained.

## 2016-03-03 NOTE — NC FL2 (Signed)
  [place] LEVEL OF CARE SCREENING TOOL     IDENTIFICATION  Patient Name: Kristina Simmons Birthdate: [birth year] Sex: female Admission Date (Current Location): 03/02/2016  [facility] and [place] Number:  Engineering geologist and Address:  [facility], [address], [place], [place] [address]      Provider Number: [id number]  Attending Physician Name and Address:  No att. providers found  Relative Name and Phone Number:       Current Level of Care: Hospital Recommended Level of Care: [place] Prior Approval Number:    Date Approved/Denied:   PASRR Number:  ([id number])  Discharge Plan: SNF    Current Diagnoses: Patient Active Problem List   Diagnosis Date Noted  . Has a tremor 05/27/2015  . Breast cancer, left breast ([place]) 01/15/2009    Orientation RESPIRATION BLADDER Height & Weight     Self, Time, Situation, Place  Normal Continent Weight:   Height:     BEHAVIORAL SYMPTOMS/MOOD NEUROLOGICAL BOWEL NUTRITION STATUS   (none )  (none ) Continent Diet (Diet: Full Liquid )  AMBULATORY STATUS COMMUNICATION OF NEEDS Skin   Extensive Assist Verbally Normal                       Personal Care Assistance Level of Assistance  Bathing, Feeding, Dressing Bathing Assistance: Limited assistance Feeding assistance: Independent Dressing Assistance: Limited assistance     Functional Limitations Info  Sight, Hearing, Speech Sight Info: Adequate Hearing Info: Adequate Speech Info: Adequate    SPECIAL CARE FACTORS FREQUENCY  PT (By licensed PT), OT (By licensed OT)     PT Frequency:  (5) OT Frequency:  (5)            Contractures      Additional Factors Info  Code Status, Allergies Code Status Info:  (Not on File ) Allergies Info:  (No Known Allergies. )           Current Medications (03/03/2016):  This is the current hospital active medication list No current facility-administered medications  for this encounter.   Current Outpatient Prescriptions  Medication Sig Dispense Refill  . amLODipine (NORVASC) 10 MG tablet Take 10 mg by mouth every other day.    . ibuprofen (ADVIL,MOTRIN) 200 MG tablet Take 800 mg by mouth every 6 (six) hours as needed for headache or mild pain.    . metoprolol succinate (TOPROL-XL) 25 MG 24 hr tablet Take 25 mg by mouth daily.    . ondansetron (ZOFRAN ODT) 4 MG disintegrating tablet Take 1 tablet (4 mg total) by mouth every 8 (eight) hours as needed for nausea or vomiting. 20 tablet 0  . oxyCODONE (ROXICODONE) 5 MG immediate release tablet Take 1 tablet (5 mg total) by mouth every 6 (six) hours as needed for severe pain. 15 tablet 0  . ranitidine (ZANTAC) 300 MG tablet Take 300 mg by mouth 2 (two) times daily as needed for heartburn.        Discharge Medications: Please see discharge summary for a list of discharge medications.  Relevant Imaging Results:  Relevant Lab Results:   Additional Information  (SSN: [id number])  Loralyn Freshwater, LCSW

## 2016-03-03 NOTE — Clinical Social Work Note (Signed)
Clinical Social Work Assessment  Patient Details  Name: Kristina Simmons MRN: [id number] Date of Birth: 08-Dec-1933  Date of referral:  03/03/16               Reason for consult:  Facility Placement                Permission sought to share information with:  Chartered certified accountant granted to share information::  Yes, Verbal Permission Granted  Name::      Media planner::   [place]   Relationship::     Contact Information:     Housing/Transportation Living arrangements for the past 2 months:  [place] of Information:  Adult Children, Spouse, Patient Patient Interpreter Needed:  None Criminal Activity/Legal Involvement Pertinent to Current Situation/Hospitalization:  No - Comment as needed Significant Relationships:  Adult Children, Spouse Lives with:  Spouse Do you feel safe going back to the place where you live?  Yes Need for family participation in patient care:  Yes (Comment)  Care giving concerns:  Patient lives in [place] with her husband Kristina Simmons.    Social Worker assessment / plan:  Holiday representative (CSW) received SNF consult from the ED. CSW requested ED physician to order PT consult. PT is recommending SNF. CSW met with patient and her husband Kristina Simmons and son Kristina Simmons were at bedside. CSW introduced self and explained role of CSW department. Patient was pleasant and alert and oriented. Per husband patient has been hard to manage at home and requested patient go to rehab at a SNF. Per Kristina Simmons he is patient's only son and lives in [place] and can provide limited support. CSW explained that patient's insurance Health Team will have to approve SNF stay. Patient, husband and son are agreeable to SNF search in [place].   CSW presented bed offers. Patient and husband chose [facility]. Per [place] Team care manager patient meets criteria for SNF and she is working on authorization. Per [facility]  admissions coordinator at [facility] patient can be admitted to [facility] today to room 505. RN will call report to 500 hall RN and arrange EMS for transport. Please reconsult if future social work needs arise. CSW signing off.    Employment status:  Disabled (Comment on whether or not currently receiving Disability) Insurance information:  Managed Medicare PT Recommendations:  [place] / Referral to community resources:  [place]  Patient/Family's Response to care:  Patient and family are agreeable for patient to be admitted to [facility] today.   Patient/Family's Understanding of and Emotional Response to Diagnosis, Current Treatment, and Prognosis:  Patient and family were pleasant and thanked CSW for assistance.   Emotional Assessment Appearance:  Appears stated age Attitude/Demeanor/Rapport:    Affect (typically observed):  Accepting, Adaptable, Pleasant Orientation:  Oriented to Self, Oriented to Place, Oriented to  Time, Oriented to Situation Alcohol / Substance use:  Not Applicable Psych involvement (Current and /or in the community):  No (Comment)  Discharge Needs  Concerns to be addressed:  Discharge Planning Concerns Readmission within the last 30 days:  No Current discharge risk:  Dependent with Mobility Barriers to Discharge:  Insurance Authorization   Elwyn Reach 03/03/2016, 2:21 PM

## 2016-03-03 NOTE — NC FL2 (Signed)
  [place] LEVEL OF CARE SCREENING TOOL     IDENTIFICATION  Patient Name: Kristina Simmons Birthdate: [birth year] Sex: female Admission Date (Current Location): 03/02/2016  [name] and [place] Number:  Engineering geologist and Address:  [facility], [address], [place], [place] [address]      Provider Number: [id number]  Attending Physician Name and Address:  Daymon Larsen, MD  Relative Name and Phone Number:       Current Level of Care: Hospital Recommended Level of Care: [place] Prior Approval Number:    Date Approved/Denied:   PASRR Number:  ([id number])  Discharge Plan: SNF    Current Diagnoses: Patient Active Problem List   Diagnosis Date Noted  . Has a tremor 05/27/2015  . Breast cancer, left breast ([place]) 01/15/2009    Orientation RESPIRATION BLADDER Height & Weight     Self, Time, Situation, Place  Normal Continent Weight:   Height:     BEHAVIORAL SYMPTOMS/MOOD NEUROLOGICAL BOWEL NUTRITION STATUS   (none )  (none ) Continent Diet (Diet: Full Liquid )  AMBULATORY STATUS COMMUNICATION OF NEEDS Skin   Extensive Assist Verbally Normal                       Personal Care Assistance Level of Assistance  Bathing, Feeding, Dressing Bathing Assistance: Limited assistance Feeding assistance: Independent Dressing Assistance: Limited assistance     Functional Limitations Info  Sight, Hearing, Speech Sight Info: Adequate Hearing Info: Adequate Speech Info: Adequate    SPECIAL CARE FACTORS FREQUENCY  PT (By licensed PT), OT (By licensed OT)     PT Frequency:  (5) OT Frequency:  (5)            Contractures      Additional Factors Info  Code Status, Allergies Code Status Info:  (Not on File ) Allergies Info:  (No Known Allergies. )           Current Medications (03/03/2016):  This is the current hospital active medication list No current facility-administered medications for  this encounter.   Current Outpatient Prescriptions  Medication Sig Dispense Refill  . amLODipine (NORVASC) 10 MG tablet Take 10 mg by mouth every other day.    . ibuprofen (ADVIL,MOTRIN) 200 MG tablet Take 800 mg by mouth every 6 (six) hours as needed for headache or mild pain.    . metoprolol succinate (TOPROL-XL) 25 MG 24 hr tablet Take 25 mg by mouth daily.    . ondansetron (ZOFRAN ODT) 4 MG disintegrating tablet Take 1 tablet (4 mg total) by mouth every 8 (eight) hours as needed for nausea or vomiting. 20 tablet 0  . oxyCODONE (ROXICODONE) 5 MG immediate release tablet Take 1 tablet (5 mg total) by mouth every 6 (six) hours as needed for severe pain. 15 tablet 0  . ranitidine (ZANTAC) 300 MG tablet Take 300 mg by mouth 2 (two) times daily as needed for heartburn.        Discharge Medications: Please see discharge summary for a list of discharge medications.  Relevant Imaging Results:  Relevant Lab Results:   Additional Information  (SSN: [id number])  Loralyn Freshwater, LCSW

## 2016-03-03 NOTE — ED Notes (Signed)
Katie RN attempted to call report, unable to get through to staff.

## 2016-03-03 NOTE — ED Notes (Signed)
Called PT. Someone will come shortly.

## 2016-03-03 NOTE — ED Provider Notes (Signed)
-----------------------------------------   2:30 PM on 03/03/2016 -----------------------------------------   Blood pressure 184/92, pulse 107, temperature 98.4 F (36.9 C), temperature source Oral, resp. rate 20, SpO2 95 %.  Assuming care from Dr. Jimmye Norman.  In short, Kristina Simmons is a 82 y.o. female with a chief complaint of Emesis .  Refer to the original H&P for additional details.  The current plan of care is to *transfer the patient to outside facility. Patient has a nondisplaced proximal left humeral fracture which will be allowed to heal by secondary healing process. She'll need to be in the left arm sling for the majority of time. Follow-up should be arranged with orthopedic surgery for repeat examination. The immediate surgery necessary.  Daymon Larsen, MD 03/03/16 [phone]

## 2016-03-03 NOTE — Clinical Social Work Placement (Signed)
   CLINICAL SOCIAL WORK PLACEMENT  NOTE  Date:  03/03/2016  Patient Details  Name: Kristina Simmons MRN: [id number] Date of Birth: February 06, 1934  Clinical Social Work is seeking post-discharge placement for this patient at the [place] level of care (*CSW will initial, date and re-position this form in  chart as items are completed):  Yes   Patient/family provided with [place] Work Department's list of facilities offering this level of care within the geographic area requested by the patient (or if unable, by the patient's family).  Yes   Patient/family informed of their freedom to choose among providers that offer the needed level of care, that participate in Medicare, Medicaid or managed care program needed by the patient, have an available bed and are willing to accept the patient.  Yes   Patient/family informed of [facility]'s ownership interest in [facility] and [facility], as well as of the fact that they are under no obligation to receive care at these facilities.  PASRR submitted to EDS on 03/03/16     PASRR number received on 03/03/16     Existing PASRR number confirmed on       FL2 transmitted to all facilities in geographic area requested by pt/family on 03/03/16     FL2 transmitted to all facilities within larger geographic area on       Patient informed that his/her managed care company has contracts with or will negotiate with certain facilities, including the following:        Yes   Patient/family informed of bed offers received.  Patient chooses bed at  [facility] )     Physician recommends and patient chooses bed at      Patient to be transferred to  [facility] ) on 03/03/16.  Patient to be transferred to facility by  [facility] )     Patient family notified on 03/03/16 of transfer.  Name of family member notified:   (Patient's husband [name] and son [name] are at bedside. )      PHYSICIAN       Additional Comment:    _______________________________________________ Loralyn Freshwater, LCSW 03/03/2016, 2:20 PM

## 2016-03-03 NOTE — Progress Notes (Addendum)
Health Team authorization has been received. Auth # [id number]. Patient will be discharged to [facility] today. CSW contacted Dr. Clydell Hakim office Ortho to set up appointment for patient. Per receptionist at [facility] she will call CSW back with an appointment time. RN aware of above. Patient's husband [name] is aware of above. Please reconsult if future social work needs arise. CSW signing off.   Blima Rich, LCSW [phone]

## 2016-03-03 NOTE — Evaluation (Signed)
Physical Therapy Evaluation Patient Details Name: Kristina Simmons MRN: [id number] DOB: May 23, 1934 Today's Date: 03/03/2016   History of Present Illness  Pt admitted to ED for L humeral fx. Pt fell at home and sustained black eye and concussion. Pt with history of fibromyalgia, breast cancer, and arthritis. Pt previously independent at baseline.  Clinical Impression  Pt is a pleasant 82 year old female who was admitted for L humeral fracture. Pt performs bed mobility, transfers, and ambulation with min assist + 2 and HW. Pt motivated to participate, however becomes nauseous/dizzy with all movement from sustained concussion. No vomiting noted. Pt demonstrates deficits with pain/strength/mobility. Pt educated on NWB status on L UE. Arm in immobilizer and propped on pillow. Would benefit from skilled PT to address above deficits and promote optimal return to PLOF; recommend transition to STR upon discharge from acute hospitalization as pt is not currently at baseline level.       Follow Up Recommendations SNF    Equipment Recommendations       Recommendations for Other Services       Precautions / Restrictions Precautions Precautions: Fall Restrictions Weight Bearing Restrictions: Yes LUE Weight Bearing: Non weight bearing      Mobility  Bed Mobility Overal bed mobility: Needs Assistance;+2 for physical assistance Bed Mobility: Supine to Sit     Supine to sit: Min assist;+2 for physical assistance     General bed mobility comments: assist for sitting at EOB towards right side. +2 assist required as pt with limited ability to assist. Once seated at EOB, pt able to sit with supervision. Pt reports increased nausea symptoms with all movement. Pt able to sit at EOB for approx 5 mins prior to needing to lie down  Transfers Overall transfer level: Needs assistance Equipment used: Hemi-walker Transfers: Sit to/from Stand Sit to Stand: Min assist;+2 physical assistance          General transfer comment: assist for initiation of movement and upright posture as she demonstrates forward flexed posture. Pt cued for correct technique using hemi-walker. +2 required for assist.  Ambulation/Gait Ambulation/Gait assistance: Min assist;+2 physical assistance Ambulation Distance (Feet): 5 Feet Assistive device: Hemi-walker Gait Pattern/deviations: Step-to pattern     General Gait Details: Pt cued for side stepping at EOB. Nausea worse with movement. Pt needs heavy cues for stepping and correct technique with HW. Pt very unsteady with +2 asisst for safety. Short step to gait pattern performed. Pt fatigues quickly with all exertion  Stairs            Wheelchair Mobility    Modified Rankin (Stroke Patients Only)       Balance Overall balance assessment: Needs assistance;History of Falls Sitting-balance support: Single extremity supported;Feet supported Sitting balance-Leahy Scale: Fair     Standing balance support: Single extremity supported Standing balance-Leahy Scale: Fair                               Pertinent Vitals/Pain Pain Assessment: 0-10 Pain Score: 5  Pain Location: nausea/dizziness Pain Descriptors / Indicators: Constant    Home Living Family/patient expects to be discharged to:: Private residence Living Arrangements: Spouse/significant other Available Help at Discharge: Family Type of Home: House Home Access: Stairs to enter Entrance Stairs-Rails:  (1 rail, unsure of side) Entrance Stairs-Number of Steps: 4 Home Layout: Two level;Bed/bath upstairs Home Equipment: None      Prior Function Level of Independence: Independent  Comments: household ambulator     Journalist, newspaper        Extremity/Trunk Assessment   Upper Extremity Assessment: Generalized weakness (didn't test L arm, as it is in immoblizer; R UE 4/5)           Lower Extremity Assessment: Generalized weakness (B LE grossly 4/5)          Communication   Communication: No difficulties  Cognition Arousal/Alertness: Lethargic Behavior During Therapy: WFL for tasks assessed/performed Overall Cognitive Status: Within Functional Limits for tasks assessed                      General Comments      Exercises Other Exercises Other Exercises: supine B LE ther-ex performed including ankle pumps, quad sets, hip add squeezes, and SLRs. Pt with increased pain on L LE from hip, reports from fall. Min assist required for completion of ther-ex with cues for sequencing. All ther-ex performed x 10 reps       Assessment/Plan    PT Assessment Patient needs continued PT services  PT Diagnosis Difficulty walking;Generalized weakness;Acute pain   PT Problem List Decreased strength;Decreased balance;Decreased mobility;Decreased knowledge of use of DME;Pain  PT Treatment Interventions DME instruction;Gait training;Therapeutic exercise   PT Goals (Current goals can be found in the Care Plan section) Acute Rehab PT Goals Patient Stated Goal: to get stronger PT Goal Formulation: With patient Time For Goal Achievement: 03/17/16 Potential to Achieve Goals: Good    Frequency 7X/week   Barriers to discharge Decreased caregiver support;Inaccessible home environment      Co-evaluation               End of Session Equipment Utilized During Treatment: Gait belt Activity Tolerance: Patient limited by lethargy;Patient limited by pain Patient left: in bed;with bed alarm set Nurse Communication: Mobility status    Functional Assessment Tool Used: clinical judgement Functional Limitation: Mobility: Walking and moving around Mobility: Walking and Moving Around Current Status [id number]): At least 40 percent but less than 60 percent impaired, limited or restricted Mobility: Walking and Moving Around Goal Status [phone]): At least 20 percent but less than 40 percent impaired, limited or restricted    Time: [id number] PT Time  Calculation (min) (ACUTE ONLY): 22 min   Charges:   PT Evaluation $PT Eval Moderate Complexity: 1 Procedure PT Treatments $Therapeutic Exercise: 8-22 mins   PT G Codes:   PT G-Codes **NOT FOR INPATIENT CLASS** Functional Assessment Tool Used: clinical judgement Functional Limitation: Mobility: Walking and moving around Mobility: Walking and Moving Around Current Status [id number]): At least 40 percent but less than 60 percent impaired, limited or restricted Mobility: Walking and Moving Around Goal Status [phone]): At least 20 percent but less than 40 percent impaired, limited or restricted    Katheen Aslin 03/03/2016, 11:44 AM  Greggory Stallion, PT, DPT [phone]

## 2016-03-04 NOTE — Progress Notes (Signed)
Clinical Education officer, museum (CSW) received a call back from [place] office. Patient's has an appointment on Wednesday May 24th, 2017 at 10 am with Reche Dixon PA, patient needs to be there at 9:45 am. Patient will go to the [place] office ([place]). CSW made [facility] admissions coordinator at [facility] aware of above. CSW contacted patient's husband [name] and left him a voicemail making him aware of above. CSW also contacted patient's son [name] and made him aware of above.   Blima Rich, LCSW [phone]

## 2016-03-05 DIAGNOSIS — I10 Essential (primary) hypertension: Secondary | ICD-10-CM | POA: Diagnosis not present

## 2016-03-05 DIAGNOSIS — R296 Repeated falls: Secondary | ICD-10-CM | POA: Diagnosis not present

## 2016-03-05 DIAGNOSIS — M810 Age-related osteoporosis without current pathological fracture: Secondary | ICD-10-CM | POA: Diagnosis not present

## 2016-03-10 DIAGNOSIS — S42202A Unspecified fracture of upper end of left humerus, initial encounter for closed fracture: Secondary | ICD-10-CM | POA: Diagnosis not present

## 2016-03-10 DIAGNOSIS — M79602 Pain in left arm: Secondary | ICD-10-CM | POA: Diagnosis not present

## 2016-03-12 ENCOUNTER — Encounter: Payer: Self-pay | Admitting: Family Medicine

## 2016-03-12 ENCOUNTER — Ambulatory Visit (INDEPENDENT_AMBULATORY_CARE_PROVIDER_SITE_OTHER): Payer: PPO | Admitting: Family Medicine

## 2016-03-12 VITALS — BP 124/80 | HR 88 | Temp 98.4°F | Resp 16 | Wt 183.0 lb

## 2016-03-12 DIAGNOSIS — S42302D Unspecified fracture of shaft of humerus, left arm, subsequent encounter for fracture with routine healing: Secondary | ICD-10-CM | POA: Diagnosis not present

## 2016-03-12 DIAGNOSIS — S0993XD Unspecified injury of face, subsequent encounter: Secondary | ICD-10-CM | POA: Diagnosis not present

## 2016-03-12 DIAGNOSIS — S0181XA Laceration without foreign body of other part of head, initial encounter: Secondary | ICD-10-CM | POA: Insufficient documentation

## 2016-03-12 DIAGNOSIS — S0181XD Laceration without foreign body of other part of head, subsequent encounter: Secondary | ICD-10-CM

## 2016-03-12 NOTE — Progress Notes (Signed)
BP 124/80 mmHg  Pulse 88  Temp(Src) 98.4 F (36.9 C) (Oral)  Resp 16  Wt 183 lb (83.008 kg)  SpO2 96%   Subjective:    Patient ID: Kristina Simmons, female    DOB: 03-15-1934, 82 y.o.   MRN: [id number]  HPI: Kristina Simmons is a 82 y.o. female  Chief Complaint  Patient presents with  . Fall    fell last monday outside hit pavement busted left side of forehead and was given stitches and broke left arm.  Patient currently in rehab center.   Patient is here for suture removal; she has stitches in the left side of her forehead above her eye Patient is new to me, as her usual provider is currently unavailable She is staying at [facility] right now; she is here with her husband  She fell on the driveway and broke her left humerus on May 15th Saw Reche Dixon, [place] at [facility]; she is now taking vicodin; tolerating that okay; at [facility] right now She was bleeding "right much" from the cut over her eye, left side of her face She was given oxycodone, and he believes the medicine caused nausea and vomiting They called EMS back the next morning; every time she sat up then had light-headed and vomiting, so back to the ER and they did more labs and an abdominal scan  ------------------------------------ Chest xray May 15th CLINICAL DATA: Fall.  EXAM: CHEST 1 VIEW  COMPARISON: CT 02/05/2009.  FINDINGS: Stable mild prominence of the azygos vein. Mediastinum hilar structures are otherwise unremarkable. Heart size normal. Mild bibasilar subsegmental atelectasis. No pleural effusion or pneumothorax. Comminuted fracture of the left humeral head and neck. a  IMPRESSION: 1. Comminuted fracture of the left humeral head and neck.  2. Mild bibasilar subsegmental atelectasis .   Electronically Signed  By: Marcello Moores Register  On: 03/01/2016 16:56  May 15th Left humerus xray CLINICAL DATA: Pt fell today from standing face first onto concrete driveway,  c/o left shoulder pain/limited ROM, no prev surg, hx left breast cancer with mastoplasty, never a smoker  EXAM: LEFT HUMERUS - 2+ VIEW  COMPARISON: None.  FINDINGS: There is a fracture the proximal humerus. There is a fracture across the proximal metaphysis with an additional fracture across the base of the greater tuberosity. Fracture is mildly displaced with the major components displaced proximally 1 cm. There is no dislocation.  Bones are demineralized. There is proximal soft tissue swelling.  IMPRESSION: Mildly comminuted and displaced fracture of the proximal left humerus. No dislocation.   Electronically Signed  By: Lajean Manes M.D.  On: 03/01/2016 16:54  Depression screen [facility] 2/9 03/12/2016 06/17/2015  Decreased Interest 0 0  Down, Depressed, Hopeless 1 0  PHQ - 2 Score 1 0   Relevant past medical, surgical, family and social history reviewed Past Medical History  Diagnosis Date  . Personal history of malignant neoplasm of breast 2010    left breast cancer; status post wide excision with mastoplasty followed by whole breast radiation and tamoxifen  . Fibromyalgia 2011  . Unspecified essential hypertension   . Sleep apnea 2006  . Arthritis   . Kidney stones   . Breast screening, unspecified   . Special screening for malignant neoplasms, colon   . Undiagnosed cardiac murmurs   . Tremor 2011  . Bursitis     left hip  . Malignant neoplasm of upper-outer quadrant of female breast ([place])     DCIS; diagnosed on January 15, 2009. The patient had originally undergone a stereotactic biopsy for microcalcifications. Atypical hyperplasia was identified. Wide excision was completed to confirm the presence or absence of DCIS or invasive tumor. The patient had a 0.5 cm foci of DCIS adjacent to the previous biopsy site. This was a grade 1 lesion with focal necrosis. ER 90%, PR 50%   . Fall   . Humerus fracture Mar 01, 2016    left   Interim medical history since  last visit reviewed. Allergies and medications reviewed  Review of Systems Per HPI unless specifically indicated above     Objective:    BP 124/80 mmHg  Pulse 88  Temp(Src) 98.4 F (36.9 C) (Oral)  Resp 16  Wt 183 lb (83.008 kg)  SpO2 96%  Wt Readings from Last 3 Encounters:  03/12/16 183 lb (83.008 kg)  03/01/16 167 lb (75.751 kg)  06/17/15 189 lb 7 oz (85.928 kg)   (I do not believe the May 15th weight was accurate)  Physical Exam  Constitutional: She appears well-developed and well-nourished.  Eyes:  Soft tissue swelling around the left eye with bruising, greenish violaceous discoloration consistent with age of contusion and injury  Pulmonary/Chest: Effort normal.  Musculoskeletal:       Left upper arm: She exhibits tenderness and swelling.  Extensive bruising and discoloration, greenish to violaceous hues and some yellow over the left shoulder and left arm; left arm in sling/brace; immobile  Skin:  Laceration with abrasion left side of forehead, lateral brow; C/D/I, with blue sutures in place and eschar; no active bleeding; no fluctuance      Assessment & Plan:   Problem List Items Addressed This Visit      Musculoskeletal and Integument   Humerus fracture - Primary    Left humerus fracture; under the care of orthopaedist; at [facility] for care      Simple laceration of face    Sutures intact, no evidence of delayed healing or infection; unfortunately, I have a tremor and was not able to remove her sutures today; she is a resident at [facility] and our Glass blower/designer contacted their staff and they can remove her sutures there on site         Follow up plan: No Follow-up on file.  An after-visit summary was printed and given to the patient at [place].  Please see the patient instructions which may contain other information and recommendations beyond what is mentioned above in the assessment and plan.  Meds ordered this encounter  Medications  .  HYDROcodone-acetaminophen (NORCO/VICODIN) 5-325 MG tablet    Sig: Take by mouth.

## 2016-03-12 NOTE — Assessment & Plan Note (Signed)
Left humerus fracture; under the care of orthopaedist; at [facility] for care

## 2016-03-12 NOTE — Patient Instructions (Signed)
Please do have your stitches removed at [facility] I am so sorry I was unable to help you today

## 2016-03-12 NOTE — Assessment & Plan Note (Signed)
Sutures intact, no evidence of delayed healing or infection; unfortunately, I have a tremor and was not able to remove her sutures today; she is a resident at [facility] and our Glass blower/designer contacted their staff and they can remove her sutures there on site

## 2016-03-17 DIAGNOSIS — I10 Essential (primary) hypertension: Secondary | ICD-10-CM | POA: Diagnosis not present

## 2016-03-17 DIAGNOSIS — S42302D Unspecified fracture of shaft of humerus, left arm, subsequent encounter for fracture with routine healing: Secondary | ICD-10-CM | POA: Diagnosis not present

## 2016-03-17 DIAGNOSIS — S0012XD Contusion of left eyelid and periocular area, subsequent encounter: Secondary | ICD-10-CM | POA: Diagnosis not present

## 2016-03-18 DIAGNOSIS — R12 Heartburn: Secondary | ICD-10-CM | POA: Diagnosis not present

## 2016-03-18 DIAGNOSIS — S0093XD Contusion of unspecified part of head, subsequent encounter: Secondary | ICD-10-CM | POA: Diagnosis not present

## 2016-03-18 DIAGNOSIS — M1990 Unspecified osteoarthritis, unspecified site: Secondary | ICD-10-CM | POA: Diagnosis not present

## 2016-03-18 DIAGNOSIS — S42302D Unspecified fracture of shaft of humerus, left arm, subsequent encounter for fracture with routine healing: Secondary | ICD-10-CM | POA: Diagnosis not present

## 2016-03-18 DIAGNOSIS — I10 Essential (primary) hypertension: Secondary | ICD-10-CM | POA: Diagnosis not present

## 2016-03-18 DIAGNOSIS — M797 Fibromyalgia: Secondary | ICD-10-CM | POA: Diagnosis not present

## 2016-03-18 DIAGNOSIS — W19XXXD Unspecified fall, subsequent encounter: Secondary | ICD-10-CM | POA: Diagnosis not present

## 2016-03-18 DIAGNOSIS — Z853 Personal history of malignant neoplasm of breast: Secondary | ICD-10-CM | POA: Diagnosis not present

## 2016-03-18 DIAGNOSIS — G4730 Sleep apnea, unspecified: Secondary | ICD-10-CM | POA: Diagnosis not present

## 2016-03-18 DIAGNOSIS — S0993XD Unspecified injury of face, subsequent encounter: Secondary | ICD-10-CM | POA: Diagnosis not present

## 2016-03-19 DIAGNOSIS — S42202D Unspecified fracture of upper end of left humerus, subsequent encounter for fracture with routine healing: Secondary | ICD-10-CM | POA: Diagnosis not present

## 2016-03-19 DIAGNOSIS — S42292D Other displaced fracture of upper end of left humerus, subsequent encounter for fracture with routine healing: Secondary | ICD-10-CM | POA: Diagnosis not present

## 2016-03-21 ENCOUNTER — Telehealth: Payer: Self-pay | Admitting: Family Medicine

## 2016-03-24 NOTE — Telephone Encounter (Signed)
Rx sent 

## 2016-04-12 DIAGNOSIS — S42202D Unspecified fracture of upper end of left humerus, subsequent encounter for fracture with routine healing: Secondary | ICD-10-CM | POA: Diagnosis not present

## 2016-04-12 DIAGNOSIS — S4992XD Unspecified injury of left shoulder and upper arm, subsequent encounter: Secondary | ICD-10-CM | POA: Diagnosis not present

## 2016-05-05 DIAGNOSIS — M25512 Pain in left shoulder: Secondary | ICD-10-CM | POA: Diagnosis not present

## 2016-05-05 DIAGNOSIS — G8929 Other chronic pain: Secondary | ICD-10-CM | POA: Diagnosis not present

## 2016-05-05 DIAGNOSIS — S63502D Unspecified sprain of left wrist, subsequent encounter: Secondary | ICD-10-CM | POA: Diagnosis not present

## 2016-05-05 DIAGNOSIS — S42202D Unspecified fracture of upper end of left humerus, subsequent encounter for fracture with routine healing: Secondary | ICD-10-CM | POA: Diagnosis not present

## 2016-05-05 DIAGNOSIS — M25532 Pain in left wrist: Secondary | ICD-10-CM | POA: Diagnosis not present

## 2016-05-06 DIAGNOSIS — M25612 Stiffness of left shoulder, not elsewhere classified: Secondary | ICD-10-CM | POA: Diagnosis not present

## 2016-05-06 DIAGNOSIS — M25512 Pain in left shoulder: Secondary | ICD-10-CM | POA: Diagnosis not present

## 2016-05-06 DIAGNOSIS — M6281 Muscle weakness (generalized): Secondary | ICD-10-CM | POA: Diagnosis not present

## 2016-05-07 DIAGNOSIS — S42302D Unspecified fracture of shaft of humerus, left arm, subsequent encounter for fracture with routine healing: Secondary | ICD-10-CM | POA: Diagnosis not present

## 2016-05-11 DIAGNOSIS — M25612 Stiffness of left shoulder, not elsewhere classified: Secondary | ICD-10-CM | POA: Diagnosis not present

## 2016-05-11 DIAGNOSIS — M25512 Pain in left shoulder: Secondary | ICD-10-CM | POA: Diagnosis not present

## 2016-05-11 DIAGNOSIS — M6281 Muscle weakness (generalized): Secondary | ICD-10-CM | POA: Diagnosis not present

## 2016-07-05 ENCOUNTER — Other Ambulatory Visit: Payer: Self-pay | Admitting: Family Medicine

## 2016-07-07 NOTE — Telephone Encounter (Signed)
Pt is still at home

## 2016-07-07 NOTE — Telephone Encounter (Signed)
Please verify that patient is not at the nursing home or assisted living home any longer Thank you

## 2016-07-22 ENCOUNTER — Encounter: Payer: Self-pay | Admitting: Family Medicine

## 2016-07-22 ENCOUNTER — Ambulatory Visit (INDEPENDENT_AMBULATORY_CARE_PROVIDER_SITE_OTHER): Payer: PPO | Admitting: Family Medicine

## 2016-07-22 VITALS — BP 134/68 | HR 98 | Temp 98.0°F | Resp 14 | Wt 179.0 lb

## 2016-07-22 DIAGNOSIS — E559 Vitamin D deficiency, unspecified: Secondary | ICD-10-CM | POA: Diagnosis not present

## 2016-07-22 DIAGNOSIS — Z23 Encounter for immunization: Secondary | ICD-10-CM

## 2016-07-22 DIAGNOSIS — I10 Essential (primary) hypertension: Secondary | ICD-10-CM

## 2016-07-22 DIAGNOSIS — C50212 Malignant neoplasm of upper-inner quadrant of left female breast: Secondary | ICD-10-CM

## 2016-07-22 DIAGNOSIS — S0181XS Laceration without foreign body of other part of head, sequela: Secondary | ICD-10-CM

## 2016-07-22 DIAGNOSIS — Z170 Estrogen receptor positive status [ER+]: Secondary | ICD-10-CM

## 2016-07-22 DIAGNOSIS — R12 Heartburn: Secondary | ICD-10-CM | POA: Diagnosis not present

## 2016-07-22 MED ORDER — METOPROLOL SUCCINATE ER 25 MG PO TB24: 25.0000 mg | ORAL_TABLET | Freq: Every day | ORAL | 5 refills | Status: DC

## 2016-07-22 MED ORDER — AMLODIPINE BESYLATE 5 MG PO TABS: 5.0000 mg | ORAL_TABLET | Freq: Every day | ORAL | 5 refills | Status: DC

## 2016-07-22 NOTE — Assessment & Plan Note (Signed)
Controlled today; changing 10 mg amlodipine every other day, we'll have her take 5 mg daily for more consistent blood pressure; try DASH guidelines

## 2016-07-22 NOTE — Progress Notes (Signed)
BP 134/68   Pulse 98   Temp 98 F (36.7 C) (Oral)   Resp 14   Wt 179 lb (81.2 kg)   SpO2 93%   BMI 29.79 kg/m    Subjective:    Patient ID: Kristina Simmons, female    DOB: 1934-03-06, 82 y.o.   MRN: [id number]  HPI: Kristina Simmons is a 82 y.o. female  Chief Complaint  Patient presents with  . Follow-up   Patient is here for follow-up Some days she feels terrible, just wants to lay around and doze; gets drowsy during the day; used to take [place] years and years ago; she weaned herself off; she has a place in the kitchen with double windows, did not get sun in the rehab facility; there for 17-18 days She had her stitches out, still healing over the left eye HTN; controlled on amlodipine and metoprolol, just for a few years; she does stay away from extra salt; does not eat black licorice; does not use decongestants; nonsmoker Heartburn, reflux; deals with that every day; has to try to avoid certain foods, but drank some low acid orange juice but still bothers her; has to avoid peppers; does eat onions sometimes but suffers; once in a while uses tums; no abd pain and no blood in the stool Reviewed previous labs from March 19, 2015; vitamin D level was low; LFTs were modestly increased, but she has had two sets of normal liver enzymes since then; she is currently not taking any vitamin D  Depression screen [facility] 2/9 07/22/2016 03/12/2016 06/17/2015  Decreased Interest 0 0 0  Down, Depressed, Hopeless 1 1 0  PHQ - 2 Score 1 1 0   Relevant past medical, surgical, family and social history reviewed Past Medical History:  Diagnosis Date  . Arthritis   . Breast screening, unspecified   . Bursitis    left hip  . Fall   . Fibromyalgia 2011  . Humerus fracture Mar 01, 2016   left  . Kidney stones   . Malignant neoplasm of upper-outer quadrant of female breast ([place])    DCIS; diagnosed on January 15, 2009. The patient had originally undergone a stereotactic biopsy for  microcalcifications. Atypical hyperplasia was identified. Wide excision was completed to confirm the presence or absence of DCIS or invasive tumor. The patient had a 0.5 cm foci of DCIS adjacent to the previous biopsy site. This was a grade 1 lesion with focal necrosis. ER 90%, PR 50%   . Personal history of malignant neoplasm of breast 2010   left breast cancer; status post wide excision with mastoplasty followed by whole breast radiation and tamoxifen  . Sleep apnea 2006  . Special screening for malignant neoplasms, colon   . Tremor 2011  . Undiagnosed cardiac murmurs   . Unspecified essential hypertension    Past Surgical History:  Procedure Laterality Date  . ABDOMINAL HYSTERECTOMY  1974  . APPENDECTOMY  1974  . BREAST EXCISIONAL BIOPSY Left 2010   breast cancer  . BREAST SURGERY Left 2010   left breast wide excision mastoplasty  . COLONOSCOPY  2004  . LITHOTRIPSY  2008  . status post radiation therapy Left    breast cancer  . UPPER GI ENDOSCOPY  May 28, 2010   Biopsy showed evidence of mild chronic gastritis. There was no evidence of H. Pylori infection. A normal duodenum was reported.    Family History  Problem Relation Age of Onset  . Thyroid disease Mother   .  Heart disease Father    Social History  Substance Use Topics  . Smoking status: Never Smoker  . Smokeless tobacco: Never Used  . Alcohol use No   Interim medical history since last visit reviewed. Allergies and medications reviewed  Review of Systems Per HPI unless specifically indicated above     Objective:    BP 134/68   Pulse 98   Temp 98 F (36.7 C) (Oral)   Resp 14   Wt 179 lb (81.2 kg)   SpO2 93%   BMI 29.79 kg/m   Wt Readings from Last 3 Encounters:  07/22/16 179 lb (81.2 kg)  03/12/16 183 lb (83 kg)  03/01/16 167 lb (75.8 kg)    Physical Exam  Constitutional: She appears well-developed and well-nourished. No distress.  HENT:  Head: Normocephalic and atraumatic.  Eyes: EOM are  normal. No scleral icterus.  Neck: No thyromegaly present.  Cardiovascular: Normal rate and regular rhythm.   Pulmonary/Chest: Effort normal and breath sounds normal. No respiratory distress.  Abdominal: Soft. Bowel sounds are normal. She exhibits no distension.  Musculoskeletal: Normal range of motion. She exhibits no edema.  Neurological: She is alert. She exhibits normal muscle tone.  Skin: Skin is warm and dry. No pallor.  Mildly erythematous / dark pink scar over the left lateral eyebrow  Psychiatric: She has a normal mood and affect. Her behavior is normal. Judgment and thought content normal.   Results for orders placed or performed during the hospital encounter of 03/02/16  Comprehensive metabolic panel  Result Value Ref Range   Sodium 137 135 - 145 mmol/L   Potassium 3.9 3.5 - 5.1 mmol/L   Chloride 108 101 - 111 mmol/L   CO2 22 22 - 32 mmol/L   Glucose, Bld 130 (H) 65 - 99 mg/dL   BUN 16 6 - 20 mg/dL   Creatinine, Ser 0.99 0.44 - 1.00 mg/dL   Calcium 9.4 8.9 - 10.3 mg/dL   Total Protein 7.1 6.5 - 8.1 g/dL   Albumin 4.0 3.5 - 5.0 g/dL   AST 33 15 - 41 U/L   ALT 31 14 - 54 U/L   Alkaline Phosphatase 44 38 - 126 U/L   Total Bilirubin 0.8 0.3 - 1.2 mg/dL   GFR calc non Af Amer 53 (L) >60 mL/min   GFR calc Af Amer >60 >60 mL/min   Anion gap 7 5 - 15  Lipase, blood  Result Value Ref Range   Lipase 41 11 - 51 U/L  CBC with Differential  Result Value Ref Range   WBC 11.2 (H) 3.6 - 11.0 K/uL   RBC 4.53 3.80 - 5.20 MIL/uL   Hemoglobin 14.1 12.0 - 16.0 g/dL   HCT 42.7 35.0 - 47.0 %   MCV 94.2 80.0 - 100.0 fL   MCH 31.3 26.0 - 34.0 pg   MCHC 33.2 32.0 - 36.0 g/dL   RDW 13.2 11.5 - 14.5 %   Platelets 199 150 - 440 K/uL   Neutrophils Relative % 82% %   Neutro Abs 9.2 (H) 1.4 - 6.5 K/uL   Lymphocytes Relative 10% %   Lymphs Abs 1.1 1.0 - 3.6 K/uL   Monocytes Relative 8% %   Monocytes Absolute 0.9 0.2 - 0.9 K/uL   Eosinophils Relative 0% %   Eosinophils Absolute 0.0 0 -  0.7 K/uL   Basophils Relative 0% %   Basophils Absolute 0.0 0 - 0.1 K/uL  Troponin I  Result Value Ref Range   Troponin  I <0.03 <0.031 ng/mL  Urinalysis complete, with microscopic  Result Value Ref Range   Color, Urine YELLOW (A) YELLOW   APPearance HAZY (A) CLEAR   Glucose, UA NEGATIVE NEGATIVE mg/dL   Bilirubin Urine NEGATIVE NEGATIVE   Ketones, ur TRACE (A) NEGATIVE mg/dL   Specific Gravity, Urine 1.011 1.005 - 1.030   Hgb urine dipstick 1+ (A) NEGATIVE   pH 6.0 5.0 - 8.0   Protein, ur NEGATIVE NEGATIVE mg/dL   Nitrite NEGATIVE NEGATIVE   Leukocytes, UA NEGATIVE NEGATIVE   RBC / HPF 0-5 0 - 5 RBC/hpf   WBC, UA 0-5 0 - 5 WBC/hpf   Bacteria, UA NONE SEEN NONE SEEN   Squamous Epithelial / LPF 0-5 (A) NONE SEEN   Mucous PRESENT       Assessment & Plan:   Problem List Items Addressed This Visit      Cardiovascular and Mediastinum   Benign hypertension - Primary (Chronic)    Controlled today; changing 10 mg amlodipine every other day, we'll have her take 5 mg daily for more consistent blood pressure; try DASH guidelines      Relevant Medications   metoprolol succinate (TOPROL-XL) 25 MG 24 hr tablet   amLODipine (NORVASC) 5 MG tablet     Musculoskeletal and Integument   Simple laceration of face    Healing appropriately        Other   Vitamin D deficiency    Take vitamin D supplementation      Heartburn (Chronic)    Discussed avoiding triggers; ranitidine daily      Breast cancer, left breast (HCC)    Order mammogram      Relevant Orders   MM Digital Diagnostic Bilat    Other Visit Diagnoses    Needs flu shot       Relevant Orders   Flu vaccine HIGH DOSE PF (Fluzone High dose) (Completed)      Follow up plan: Return in about 3 months (around 10/22/2016).  An after-visit summary was printed and given to the patient at [place].  Please see the patient instructions which may contain other information and recommendations beyond what is mentioned above in  the assessment and plan.  Meds ordered this encounter  Medications  . Acetaminophen 500 MG coapsule    Sig: Take by mouth.  Marland Kitchen ibuprofen (ADVIL,MOTRIN) 200 MG tablet    Sig: Take by mouth.  . metoprolol succinate (TOPROL-XL) 25 MG 24 hr tablet    Sig: Take 1 tablet (25 mg total) by mouth daily.    Dispense:  30 tablet    Refill:  5  . amLODipine (NORVASC) 5 MG tablet    Sig: Take 1 tablet (5 mg total) by mouth daily.    Dispense:  30 tablet    Refill:  5    Changing strength; cancel the 10 mg strength    Orders Placed This Encounter  Procedures  . MM Digital Diagnostic Bilat  . Flu vaccine HIGH DOSE PF (Fluzone High dose)

## 2016-07-22 NOTE — Patient Instructions (Addendum)
I'll suggest that you start taking 1,000 iu of vitamin D3 once a day; no more than twice a day I'll also suggest that you start vitamin B12 500 or 1000 mcg daily to help with energy We'll contact you about the mammogram If you have not heard anything from my staff in a week about any orders/referrals/studies from today, please contact us here to follow-up [phone] I do recommend yearly flu shots; for individuals who don't want flu shots, try to practice excellent hand hygiene, and avoid nursing homes, day cares, and hospitals during peak flu season; taking additional vitamin C daily during flu/cold season may help boost your immune system too Return in 3 months for visit and fasting labs  DASH Eating Plan DASH stands for "Dietary Approaches to Stop Hypertension." The DASH eating plan is a healthy eating plan that has been shown to reduce high blood pressure (hypertension). Additional health benefits may include reducing the risk of type 2 diabetes mellitus, heart disease, and stroke. The DASH eating plan may also help with weight loss. WHAT DO I NEED TO KNOW ABOUT THE DASH EATING PLAN? For the DASH eating plan, you will follow these general guidelines:  Choose foods with a percent daily value for sodium of less than 5% (as listed on the food label).  Use salt-free seasonings or herbs instead of table salt or sea salt.  Check with your health care provider or pharmacist before using salt substitutes.  Eat lower-sodium products, often labeled as "lower sodium" or "no salt added."  Eat fresh foods.  Eat more vegetables, fruits, and low-fat dairy products.  Choose whole grains. Look for the word "whole" as the first word in the ingredient list.  Choose fish and skinless chicken or [place] more often than red meat. Limit fish, poultry, and meat to 6 oz (170 g) each day.  Limit sweets, desserts, sugars, and sugary drinks.  Choose heart-healthy fats.  Limit cheese to 1 oz (28 g) per  day.  Eat more home-cooked food and less restaurant, buffet, and fast food.  Limit fried foods.  Cook foods using methods other than frying.  Limit canned vegetables. If you do use them, rinse them well to decrease the sodium.  When eating at a restaurant, ask that your food be prepared with less salt, or no salt if possible. WHAT FOODS CAN I EAT? Seek help from a dietitian for individual calorie needs. Grains Whole grain or whole wheat bread. Brown rice. Whole grain or whole wheat pasta. Quinoa, bulgur, and whole grain cereals. Low-sodium cereals. Corn or whole wheat flour tortillas. Whole grain cornbread. Whole grain crackers. Low-sodium crackers. Vegetables Fresh or frozen vegetables (raw, steamed, roasted, or grilled). Low-sodium or reduced-sodium tomato and vegetable juices. Low-sodium or reduced-sodium tomato sauce and paste. Low-sodium or reduced-sodium canned vegetables.  Fruits All fresh, canned (in natural juice), or frozen fruits. Meat and Other Protein Products Ground beef (85% or leaner), grass-fed beef, or beef trimmed of fat. Skinless chicken or [place]. Ground chicken or [place]. Pork trimmed of fat. All fish and seafood. Eggs. Dried beans, peas, or lentils. Unsalted nuts and seeds. Unsalted canned beans. Dairy Low-fat dairy products, such as skim or 1% milk, 2% or reduced-fat cheeses, low-fat ricotta or cottage cheese, or plain low-fat yogurt. Low-sodium or reduced-sodium cheeses. Fats and Oils Tub margarines without trans fats. Light or reduced-fat mayonnaise and salad dressings (reduced sodium). Avocado. Safflower, olive, or canola oils. Natural peanut or almond butter. Other Unsalted popcorn and pretzels. The items listed  above may not be a complete list of recommended foods or beverages. Contact your dietitian for more options. WHAT FOODS ARE NOT RECOMMENDED? Grains White bread. White pasta. White rice. Refined cornbread. Bagels and croissants. Crackers that contain  trans fat. Vegetables Creamed or fried vegetables. Vegetables in a cheese sauce. Regular canned vegetables. Regular canned tomato sauce and paste. Regular tomato and vegetable juices. Fruits Dried fruits. Canned fruit in light or heavy syrup. Fruit juice. Meat and Other Protein Products Fatty cuts of meat. Ribs, chicken wings, bacon, sausage, bologna, salami, chitterlings, fatback, hot dogs, bratwurst, and packaged luncheon meats. Salted nuts and seeds. Canned beans with salt. Dairy Whole or 2% milk, cream, half-and-half, and cream cheese. Whole-fat or sweetened yogurt. Full-fat cheeses or blue cheese. Nondairy creamers and whipped toppings. Processed cheese, cheese spreads, or cheese curds. Condiments Onion and garlic salt, seasoned salt, table salt, and sea salt. Canned and packaged gravies. Worcestershire sauce. Tartar sauce. Barbecue sauce. Teriyaki sauce. Soy sauce, including reduced sodium. Steak sauce. Fish sauce. Oyster sauce. Cocktail sauce. Horseradish. Ketchup and mustard. Meat flavorings and tenderizers. Bouillon cubes. Hot sauce. Tabasco sauce. Marinades. Taco seasonings. Relishes. Fats and Oils Butter, stick margarine, lard, shortening, ghee, and bacon fat. Coconut, palm kernel, or palm oils. Regular salad dressings. Other Pickles and olives. Salted popcorn and pretzels. The items listed above may not be a complete list of foods and beverages to avoid. Contact your dietitian for more information. WHERE CAN I FIND MORE INFORMATION? National Heart, Lung, and Blood Institute: travelstabloid.com   This information is not intended to replace advice given to you by your health care provider. Make sure you discuss any questions you have with your health care provider.   Document Released: 09/23/2011 Document Revised: 10/25/2014 Document Reviewed: 08/08/2013 Elsevier Interactive Patient Education [employer].

## 2016-07-22 NOTE — Assessment & Plan Note (Signed)
Order mammogram °

## 2016-07-27 DIAGNOSIS — R12 Heartburn: Secondary | ICD-10-CM | POA: Insufficient documentation

## 2016-07-27 DIAGNOSIS — E559 Vitamin D deficiency, unspecified: Secondary | ICD-10-CM | POA: Insufficient documentation

## 2016-07-27 NOTE — Assessment & Plan Note (Signed)
Healing appropriately. 

## 2016-07-27 NOTE — Assessment & Plan Note (Signed)
Take vitamin D supplementation

## 2016-07-27 NOTE — Assessment & Plan Note (Addendum)
Discussed avoiding triggers; ranitidine daily

## 2016-08-12 ENCOUNTER — Telehealth: Payer: Self-pay

## 2016-08-12 DIAGNOSIS — D485 Neoplasm of uncertain behavior of skin: Secondary | ICD-10-CM | POA: Insufficient documentation

## 2016-08-12 NOTE — Telephone Encounter (Signed)
Pt would like to see if you can referral her to [facility]. She has multiple moles all over her right arm and she would like to see if they can be remove off.

## 2016-08-12 NOTE — Assessment & Plan Note (Signed)
Refer to derm per pt request.  

## 2016-08-12 NOTE — Addendum Note (Signed)
Addended by: Torri Langston, Satira Anis on: 08/12/2016 05:26 PM   Modules accepted: Orders

## 2016-08-19 ENCOUNTER — Ambulatory Visit: Payer: PPO

## 2016-09-22 ENCOUNTER — Ambulatory Visit: Payer: PPO

## 2016-10-14 ENCOUNTER — Ambulatory Visit: Payer: PPO

## 2016-10-22 ENCOUNTER — Ambulatory Visit: Payer: Self-pay | Admitting: Family Medicine

## 2016-11-10 ENCOUNTER — Other Ambulatory Visit: Payer: Self-pay | Admitting: Family Medicine

## 2016-11-10 DIAGNOSIS — Z1231 Encounter for screening mammogram for malignant neoplasm of breast: Secondary | ICD-10-CM

## 2016-12-06 ENCOUNTER — Ambulatory Visit
Admission: RE | Admit: 2016-12-06 | Discharge: 2016-12-06 | Disposition: A | Discharge status: Home / Self Care | Payer: PPO | Source: Ambulatory Visit | Attending: Family Medicine | Admitting: Family Medicine

## 2016-12-06 DIAGNOSIS — Z1231 Encounter for screening mammogram for malignant neoplasm of breast: Secondary | ICD-10-CM | POA: Insufficient documentation

## 2016-12-06 HISTORY — DX: Personal history of irradiation: Z92.3

## 2016-12-06 HISTORY — DX: Malignant neoplasm of unspecified site of unspecified female breast: C50.919

## 2017-08-03 ENCOUNTER — Telehealth: Payer: Self-pay | Admitting: Family Medicine

## 2017-08-04 NOTE — Telephone Encounter (Signed)
Left voice message for patient to schedule an appt

## 2017-08-04 NOTE — Telephone Encounter (Signed)
Patient has not been seen in over a year; please ask her to come in for an appt I sent one month of refills

## 2017-08-26 ENCOUNTER — Ambulatory Visit: Payer: Self-pay | Admitting: Family Medicine

## 2017-09-01 ENCOUNTER — Ambulatory Visit: Payer: PPO | Admitting: Family Medicine

## 2017-09-01 ENCOUNTER — Telehealth: Payer: Self-pay

## 2017-09-01 ENCOUNTER — Encounter: Payer: Self-pay | Admitting: Family Medicine

## 2017-09-01 VITALS — BP 122/78 | HR 86 | Temp 98.1°F | Ht 65.0 in | Wt 165.5 lb

## 2017-09-01 DIAGNOSIS — M47812 Spondylosis without myelopathy or radiculopathy, cervical region: Secondary | ICD-10-CM | POA: Diagnosis not present

## 2017-09-01 DIAGNOSIS — Z23 Encounter for immunization: Secondary | ICD-10-CM | POA: Diagnosis not present

## 2017-09-01 DIAGNOSIS — Z5181 Encounter for therapeutic drug level monitoring: Secondary | ICD-10-CM

## 2017-09-01 DIAGNOSIS — R634 Abnormal weight loss: Secondary | ICD-10-CM | POA: Diagnosis not present

## 2017-09-01 DIAGNOSIS — I10 Essential (primary) hypertension: Secondary | ICD-10-CM | POA: Diagnosis not present

## 2017-09-01 DIAGNOSIS — I679 Cerebrovascular disease, unspecified: Secondary | ICD-10-CM

## 2017-09-01 DIAGNOSIS — Z853 Personal history of malignant neoplasm of breast: Secondary | ICD-10-CM

## 2017-09-01 DIAGNOSIS — Z0289 Encounter for other administrative examinations: Secondary | ICD-10-CM | POA: Diagnosis not present

## 2017-09-01 DIAGNOSIS — E559 Vitamin D deficiency, unspecified: Secondary | ICD-10-CM | POA: Diagnosis not present

## 2017-09-01 DIAGNOSIS — R251 Tremor, unspecified: Secondary | ICD-10-CM

## 2017-09-01 DIAGNOSIS — R2681 Unsteadiness on feet: Secondary | ICD-10-CM | POA: Insufficient documentation

## 2017-09-01 NOTE — Assessment & Plan Note (Signed)
Check liver and kidneys 

## 2017-09-01 NOTE — Assessment & Plan Note (Signed)
FL-2 form completed with patient

## 2017-09-01 NOTE — Assessment & Plan Note (Signed)
Check level and supplemen if needed; limited outside time

## 2017-09-01 NOTE — Progress Notes (Signed)
BP 122/78 (BP Location: Left Arm, Patient Position: Sitting, Cuff Size: Large)   Pulse 86   Temp 98.1 F (36.7 C) (Oral)   Ht 5\' 5"  (1.651 m)   Wt 165 lb 8 oz (75.1 kg)   SpO2 93%   BMI 27.54 kg/m    Subjective:    Patient ID: Kristina Simmons, female    DOB: 02-28-1934, 83 y.o.   MRN: [id number]  HPI: Kristina Simmons is a 83 y.o. female  Chief Complaint  Patient presents with  . Fall    Pt and son want to discuss assisted living process  . Immunizations    flu shot today   . Insomnia   HPI Patient is here husband and son [place] a year ago She cannot stay where she is much longer; just can't do much of anything She is not cooking and they are not eating well Trouble taking a bath, trouble sleeping She does not drive She does have a cane but doesn't use it much; patient says she needs to use it in the house Trouble with posture and weakness; homebound Only son lives in [place] They are just starting investigating the assisted living form She has HTN; does not take the amlodipine every day; also alternating the metoprolol as well Resting tremors, gait instability maybe a year or more She has a hx of breast cancer; patient reports being UTD on her mammograms; she has felt no lumps Review of the chart shows mammogram done 12/06/16  Head CT May 2017 IMPRESSION: 1. Moderate left supraorbital scalp contusion. No evidence of acute intracranial abnormality. No evidence of calvarial fracture. 2. No maxillofacial fracture. 3. Extraconal soft tissue swelling in the bilateral orbits, probably representing superficial orbital contusions. Nonspecific prominent enlargement of the right inferior rectus muscle, which could represent an intramuscular extraocular muscle hematoma, although other etiologies such as thyroid ophthalmopathy (patient has multinodular goiter) or breast cancer metastasis (patient has history of breast cancer) cannot be excluded. 4. Prominent  chronic small vessel ischemia. 5. No cervical spine fracture. 6. Moderate degenerative changes in mid to lower cervical spine as described. Minimal 2 mm anterolisthesis at C4-5 is probably degenerative.   Electronically Signed   By: Ilona Sorrel M.D.   On: 03/01/2016 17:35  Depression screen [facility] 2/9 09/01/2017 07/22/2016 03/12/2016 06/17/2015  Decreased Interest 2 0 0 0  Down, Depressed, Hopeless 2 1 1  0  PHQ - 2 Score 4 1 1  0  Altered sleeping 1 - - -  Tired, decreased energy 1 - - -  Change in appetite 2 - - -  Feeling bad or failure about yourself  0 - - -  Trouble concentrating 0 - - -  Moving slowly or fidgety/restless 0 - - -  Suicidal thoughts 0 - - -  PHQ-9 Score 8 - - -  Difficult doing work/chores Very difficult - - -    Relevant past medical, surgical, family and social history reviewed Past Medical History:  Diagnosis Date  . Arthritis   . Breast cancer ([place]) 2010   DCIS; diagnosed on January 15, 2009. The patient had originally undergone a stereotactic biopsy for microcalcifications. Atypical hyperplasia was identified. Wide excision was completed to confirm the presence or absence of DCIS or invasive tumor. The patient had a 0.5 cm foci of DCIS adjacent to the previous biopsy site. This was a grade 1 lesion with focal necrosis. ER 90%, PR 50%   . Breast screening, unspecified   . Bursitis  left hip  . Fall   . Fibromyalgia 2011  . Humerus fracture Mar 01, 2016   left  . Kidney stones   . Personal history of malignant neoplasm of breast 2010   left breast cancer; status post wide excision with mastoplasty followed by whole breast radiation and tamoxifen  . Personal history of radiation therapy 2010   BREAST CA  . Sleep apnea 2006  . Special screening for malignant neoplasms, colon   . Tremor 2011  . Undiagnosed cardiac murmurs   . Unspecified essential hypertension    Past Surgical History:  Procedure Laterality Date  . ABDOMINAL HYSTERECTOMY  1974  .  APPENDECTOMY  1974  . BREAST EXCISIONAL BIOPSY Left 2010   breast cancer  . BREAST SURGERY Left 2010   left breast wide excision mastoplasty  . COLONOSCOPY  2004  . LITHOTRIPSY  2008  . status post radiation therapy Left    breast cancer  . UPPER GI ENDOSCOPY  May 28, 2010   Biopsy showed evidence of mild chronic gastritis. There was no evidence of H. Pylori infection. A normal duodenum was reported.    Family History  Problem Relation Age of Onset  . Thyroid disease Mother   . Heart disease Father   . Breast cancer Neg Hx    Social History   Socioeconomic History  . Marital status: Married    Spouse name: Not on file  . Number of children: Not on file  . Years of education: Not on file  . Highest education level: Not on file  Social Needs  . Financial resource strain: Not on file  . Food insecurity - worry: Not on file  . Food insecurity - inability: Not on file  . Transportation needs - medical: Not on file  . Transportation needs - non-medical: Not on file  Occupational History  . Not on file  Tobacco Use  . Smoking status: Never Smoker  . Smokeless tobacco: Never Used  Substance and Sexual Activity  . Alcohol use: No  . Drug use: No  . Sexual activity: Not Currently  Other Topics Concern  . Not on file  Social History Narrative  . Not on file    Interim medical history since last visit reviewed. Allergies and medications reviewed  Review of Systems Per HPI unless specifically indicated above     Objective:    BP 122/78 (BP Location: Left Arm, Patient Position: Sitting, Cuff Size: Large)   Pulse 86   Temp 98.1 F (36.7 C) (Oral)   Ht 5\' 5"  (1.651 m)   Wt 165 lb 8 oz (75.1 kg)   SpO2 93%   BMI 27.54 kg/m   Wt Readings from Last 3 Encounters:  09/01/17 165 lb 8 oz (75.1 kg)  07/22/16 179 lb (81.2 kg)  03/12/16 183 lb (83 kg)    Physical Exam  Constitutional: She appears well-developed and well-nourished. No distress.  Weight loss noted    HENT:  Head: Normocephalic and atraumatic.  Eyes: EOM are normal. No scleral icterus.  Neck: No thyromegaly present.  Cardiovascular: Normal rate and regular rhythm.  Pulmonary/Chest: Effort normal and breath sounds normal. No respiratory distress.  Abdominal: Soft. Bowel sounds are normal. She exhibits no distension.  Musculoskeletal: Normal range of motion. She exhibits no edema.  Neurological: She is alert. She displays tremor. She exhibits normal muscle tone. Gait abnormal.  Unsteady gait; unable to walk heel-to-toe; able to rise from the chair unassisted; resting tremor noted of hands and  lip when distracted; smiles a little but otherwise has rather masked facies; gait is not necessarily shuffling but is rather slow and deliberate  Skin: Skin is warm and dry. No pallor.  Mildly erythematous / dark pink scar over the left lateral eyebrow  Psychiatric: Her behavior is normal. Judgment and thought content normal. Her affect is blunt. Her speech is not slurred.   Results for orders placed or performed during the hospital encounter of 03/02/16  Comprehensive metabolic panel  Result Value Ref Range   Sodium 137 135 - 145 mmol/L   Potassium 3.9 3.5 - 5.1 mmol/L   Chloride 108 101 - 111 mmol/L   CO2 22 22 - 32 mmol/L   Glucose, Bld 130 (H) 65 - 99 mg/dL   BUN 16 6 - 20 mg/dL   Creatinine, Ser 0.99 0.44 - 1.00 mg/dL   Calcium 9.4 8.9 - 10.3 mg/dL   Total Protein 7.1 6.5 - 8.1 g/dL   Albumin 4.0 3.5 - 5.0 g/dL   AST 33 15 - 41 U/L   ALT 31 14 - 54 U/L   Alkaline Phosphatase 44 38 - 126 U/L   Total Bilirubin 0.8 0.3 - 1.2 mg/dL   GFR calc non Af Amer 53 (L) >60 mL/min   GFR calc Af Amer >60 >60 mL/min   Anion gap 7 5 - 15  Lipase, blood  Result Value Ref Range   Lipase 41 11 - 51 U/L  CBC with Differential  Result Value Ref Range   WBC 11.2 (H) 3.6 - 11.0 K/uL   RBC 4.53 3.80 - 5.20 MIL/uL   Hemoglobin 14.1 12.0 - 16.0 g/dL   HCT 42.7 35.0 - 47.0 %   MCV 94.2 80.0 - 100.0 fL    MCH 31.3 26.0 - 34.0 pg   MCHC 33.2 32.0 - 36.0 g/dL   RDW 13.2 11.5 - 14.5 %   Platelets 199 150 - 440 K/uL   Neutrophils Relative % 82% %   Neutro Abs 9.2 (H) 1.4 - 6.5 K/uL   Lymphocytes Relative 10% %   Lymphs Abs 1.1 1.0 - 3.6 K/uL   Monocytes Relative 8% %   Monocytes Absolute 0.9 0.2 - 0.9 K/uL   Eosinophils Relative 0% %   Eosinophils Absolute 0.0 0 - 0.7 K/uL   Basophils Relative 0% %   Basophils Absolute 0.0 0 - 0.1 K/uL  Troponin I  Result Value Ref Range   Troponin I <0.03 <0.031 ng/mL  Urinalysis complete, with microscopic  Result Value Ref Range   Color, Urine YELLOW (A) YELLOW   APPearance HAZY (A) CLEAR   Glucose, UA NEGATIVE NEGATIVE mg/dL   Bilirubin Urine NEGATIVE NEGATIVE   Ketones, ur TRACE (A) NEGATIVE mg/dL   Specific Gravity, Urine 1.011 1.005 - 1.030   Hgb urine dipstick 1+ (A) NEGATIVE   pH 6.0 5.0 - 8.0   Protein, ur NEGATIVE NEGATIVE mg/dL   Nitrite NEGATIVE NEGATIVE   Leukocytes, UA NEGATIVE NEGATIVE   RBC / HPF 0-5 0 - 5 RBC/hpf   WBC, UA 0-5 0 - 5 WBC/hpf   Bacteria, UA NONE SEEN NONE SEEN   Squamous Epithelial / LPF 0-5 (A) NONE SEEN   Mucus PRESENT       Assessment & Plan:   Problem List Items Addressed This Visit      Cardiovascular and Mediastinum   Small vessel disease, cerebrovascular    Check lipids; refer to neurologist      Relevant Orders   Lipid  panel   Benign hypertension (Chronic)    Will try to control with just one agent, the beta-blocker; return to see CMA in 10 days or so for pulse and BP check; try to follow DASH guidelines        Musculoskeletal and Integument   Cervical spine degeneration     Other   Weight loss    No bone pain; no lumps in breasts; UTD on mammos; check thyroid; suspect likely due to decreased intake and food availability      Relevant Orders   CBC with Differential/Platelet   COMPLETE METABOLIC PANEL WITH GFR   TSH   Vitamin D deficiency    Check level and supplemen if needed;  limited outside time      Relevant Orders   VITAMIN D 25 Hydroxy (Vit-D Deficiency, Fractures)   Medication monitoring encounter    Check liver and kidneys      History of left breast cancer    Mammogram UTD; consider brain imaging to check for mets, but will start with neurology referral first to check for parkinson's or other dementia given her gait and tremor      Has a tremor    Resting tremor with slight cogwheeling; refer to neurologist      Relevant Orders   Ambulatory referral to Neurology   Gait instability    Refer to PT; safety; fall precautions      Relevant Orders   B12   Ambulatory referral to Physical Therapy   Ambulatory referral to Neurology   Encounter for completion of form with patient    FL-2 form completed with patient       Other Visit Diagnoses    Needs flu shot    -  Primary   Relevant Orders   Flu vaccine HIGH DOSE PF (Fluzone High dose) (Completed)       Follow up plan: Return in about 11 days (around 09/12/2017) for follow-up visit with Dr. Sanda Klein.  An after-visit summary was printed and given to the patient at [place].  Please see the patient instructions which may contain other information and recommendations beyond what is mentioned above in the assessment and plan.  Meds ordered this encounter  Medications  . DISCONTD: gabapentin (NEURONTIN) 100 MG capsule    Sig: Take 100 mg by mouth.   . DISCONTD: HYDROcodone-acetaminophen (NORCO/VICODIN) 5-325 MG tablet    Sig: Take by mouth.  . DISCONTD: ondansetron (ZOFRAN-ODT) 4 MG disintegrating tablet    Sig: Take by mouth.  . DISCONTD: oxyCODONE (OXY IR/ROXICODONE) 5 MG immediate release tablet    Sig: Take by mouth.  . DISCONTD: zolpidem (AMBIEN) 10 MG tablet    Sig: Take by mouth.    Orders Placed This Encounter  Procedures  . Flu vaccine HIGH DOSE PF (Fluzone High dose)  . CBC with Differential/Platelet  . COMPLETE METABOLIC PANEL WITH GFR  . TSH  . VITAMIN D 25 Hydroxy (Vit-D  Deficiency, Fractures)  . B12  . Lipid panel  . Ambulatory referral to Physical Therapy  . Ambulatory referral to Neurology   Face-to-face time with patient was more than 40 minutes, >50% time spent counseling and coordination of care

## 2017-09-01 NOTE — Assessment & Plan Note (Signed)
Check lipids; refer to neurologist

## 2017-09-01 NOTE — Patient Instructions (Addendum)
Stop the amlodipine completely Please take the metoprolol 25 mg just once a day every day Let's get labs We'll refer you to physical therapy and a neurologist If you have not heard anything from my staff in a week about any orders/referrals/studies from today, please contact us here to follow-up [phone]

## 2017-09-01 NOTE — Telephone Encounter (Signed)
Copied from [place] [id number]. Topic: General - Other >> Sep 01, 2017  2:51 PM Neva Seat wrote: Pt returned call back.  Please give her a call back at anytime.   When pt calls back please get her son's name and telephone number.   Called pt, got disconnected.

## 2017-09-01 NOTE — Telephone Encounter (Signed)
Called pt no answer. LM for pt asking her to call back with her son's contact information so that we can add it to FL2 Form. CRM created.

## 2017-09-01 NOTE — Assessment & Plan Note (Addendum)
Resting tremor with slight cogwheeling; refer to neurologist

## 2017-09-01 NOTE — Assessment & Plan Note (Signed)
Mammogram UTD; consider brain imaging to check for mets, but will start with neurology referral first to check for parkinson's or other dementia given her gait and tremor

## 2017-09-01 NOTE — Assessment & Plan Note (Signed)
Refer to PT; safety; fall precautions

## 2017-09-01 NOTE — Assessment & Plan Note (Signed)
No bone pain; no lumps in breasts; UTD on mammos; check thyroid; suspect likely due to decreased intake and food availability

## 2017-09-01 NOTE — Assessment & Plan Note (Signed)
Will try to control with just one agent, the beta-blocker; return to see CMA in 10 days or so for pulse and BP check; try to follow DASH guidelines

## 2017-09-02 ENCOUNTER — Other Ambulatory Visit: Payer: Self-pay | Admitting: Family Medicine

## 2017-09-02 ENCOUNTER — Telehealth: Payer: Self-pay | Admitting: Family Medicine

## 2017-09-02 LAB — CBC WITH DIFFERENTIAL/PLATELET
BASOS PCT: 0.6 %
Basophils Absolute: 52 cells/uL (ref 0–200)
EOS PCT: 0.8 %
Eosinophils Absolute: 70 cells/uL (ref 15–500)
HCT: 47.3 % — ABNORMAL HIGH (ref 35.0–45.0)
HEMOGLOBIN: 16.2 g/dL — AB (ref 11.7–15.5)
Lymphs Abs: 1444 cells/uL (ref 850–3900)
MCH: 31.8 pg (ref 27.0–33.0)
MCHC: 34.2 g/dL (ref 32.0–36.0)
MCV: 92.9 fL (ref 80.0–100.0)
MONOS PCT: 7.1 %
MPV: 10.6 fL (ref 7.5–12.5)
NEUTROS ABS: 6516 {cells}/uL (ref 1500–7800)
Neutrophils Relative %: 74.9 %
PLATELETS: 199 10*3/uL (ref 140–400)
RBC: 5.09 10*6/uL (ref 3.80–5.10)
RDW: 12 % (ref 11.0–15.0)
TOTAL LYMPHOCYTE: 16.6 %
WBC mixed population: 618 cells/uL (ref 200–950)
WBC: 8.7 10*3/uL (ref 3.8–10.8)

## 2017-09-02 LAB — COMPLETE METABOLIC PANEL WITH GFR
AG Ratio: 1.5 (calc) (ref 1.0–2.5)
ALBUMIN MSPROF: 4.3 g/dL (ref 3.6–5.1)
ALKALINE PHOSPHATASE (APISO): 66 U/L (ref 33–130)
ALT: 20 U/L (ref 6–29)
AST: 22 U/L (ref 10–35)
BILIRUBIN TOTAL: 0.8 mg/dL (ref 0.2–1.2)
BUN / CREAT RATIO: 21 (calc) (ref 6–22)
BUN: 19 mg/dL (ref 7–25)
CHLORIDE: 105 mmol/L (ref 98–110)
CO2: 26 mmol/L (ref 20–32)
CREATININE: 0.91 mg/dL — AB (ref 0.60–0.88)
Calcium: 9.9 mg/dL (ref 8.6–10.4)
GFR, Est African American: 69 mL/min/{1.73_m2} (ref >=60)
GFR, Est Non African American: 60 mL/min/{1.73_m2} (ref >=60)
GLUCOSE: 84 mg/dL (ref 65–99)
Globulin: 2.9 g/dL (calc) (ref 1.9–3.7)
Potassium: 4.2 mmol/L (ref 3.5–5.3)
Sodium: 139 mmol/L (ref 135–146)
TOTAL PROTEIN: 7.2 g/dL (ref 6.1–8.1)

## 2017-09-02 LAB — VITAMIN B12: VITAMIN B 12: 379 pg/mL (ref 200–1100)

## 2017-09-02 LAB — TSH: TSH: 0.65 m[IU]/L (ref 0.40–4.50)

## 2017-09-02 LAB — VITAMIN D 25 HYDROXY (VIT D DEFICIENCY, FRACTURES): VIT D 25 HYDROXY: 21 ng/mL — AB (ref 30–100)

## 2017-09-02 MED ORDER — VITAMIN D (ERGOCALCIFEROL) 1.25 MG (50000 UNIT) PO CAPS: 50000.0000 [IU] | ORAL_CAPSULE | ORAL | 1 refills | Status: AC

## 2017-09-02 NOTE — Telephone Encounter (Signed)
Copied from [place] [id number]. Topic: Referral - Request >> Sep 02, 2017 10:49 AM Malena Catholic I, NT wrote: Reason for CRM: pt called she said Doctor Lada what her to see a Neurology a soon is possible

## 2017-09-02 NOTE — Telephone Encounter (Signed)
I contacted [facility], spoke with [facility] @ 11:40am and she informed me that a message was left but then she spoke with them she was told that they could not schedule until they speak with their son.   I went on and got them scheduled for 09/12/17 @ 1pm & 2pm with Dirk Dress, NP.  I then called this patient back to inform her of the appointment and she said that it will have to be a time when her son, [name], is off.   I called him and left him a message regarding the above listed information and gave him the number to [facility] [phone])  in case he needed to reschedule.   Copied from [place] [phone]. Topic: Inquiry >> Sep 02, 2017 10:53 AM Corie Chiquito, [place] wrote: Reason for CRM: Patient called because she has questions about a referral that her doctor was doing for her to see a neurologist. If someone could please give her a call back about this matter.

## 2017-09-02 NOTE — Progress Notes (Signed)
Start Rx vit D Recommended oral OTC vit B12

## 2017-09-03 LAB — LIPID PANEL
CHOLESTEROL: 158 mg/dL (ref <200)
HDL: 46 mg/dL — AB (ref >50)
LDL CHOLESTEROL (CALC): 85 mg/dL
Non-HDL Cholesterol (Calc): 112 mg/dL (calc) (ref <130)
TRIGLYCERIDES: 178 mg/dL — AB (ref <150)
Total CHOL/HDL Ratio: 3.4 (calc) (ref <5.0)

## 2017-09-05 ENCOUNTER — Telehealth: Payer: Self-pay | Admitting: Family Medicine

## 2017-09-05 ENCOUNTER — Telehealth: Payer: Self-pay

## 2017-09-05 NOTE — Telephone Encounter (Signed)
Patient said her voicemail is acting up & couldn't listen to the message. Please call her back [phone]

## 2017-09-05 NOTE — Telephone Encounter (Signed)
Pt refused pulmonology c/s. She stated she had a sleep study years ago and was on CPAP but said it was a very "mild" case. Redilts given and to pick up RX (Vitamin D) and B 12 OTC.

## 2017-09-05 NOTE — Telephone Encounter (Signed)
Pt given results Pt is refusing pulmonology c/s  Informed to pick up Vit D and B12.

## 2017-09-05 NOTE — Telephone Encounter (Signed)
-----   Message from Arnetha Courser, MD sent at 09/02/2017  6:02 PM EST ----- Please let patient know that her white count is normal. Her H/H is a little high which can suggest dehydration, but also sleep apnea or low oxygen levels at night. If she snores or has sleep apnea, then refer her now please to pulmonary (Dr. Ashby Dawes). Her vitamin D is quite low so I'm sending a prescription for vit D replacement. Her vitamin B12 is low, so start sublingual vitamin B12 250 or 500 mcg once a day. We'll go over all labs at her appointment. Thank you

## 2017-09-05 NOTE — Telephone Encounter (Signed)
Called pt. No answer. Left detailed message for pt with all information below. CRM created.

## 2017-09-05 NOTE — Telephone Encounter (Signed)
Called pt no answer. LM. Labs routed to [facility].

## 2017-09-06 NOTE — Telephone Encounter (Signed)
Thank you :)

## 2017-09-07 ENCOUNTER — Other Ambulatory Visit: Payer: Self-pay | Admitting: Family Medicine

## 2017-09-08 ENCOUNTER — Other Ambulatory Visit: Payer: Self-pay | Admitting: Family Medicine

## 2017-09-19 ENCOUNTER — Ambulatory Visit: Payer: Self-pay | Admitting: Family Medicine

## 2017-09-30 ENCOUNTER — Other Ambulatory Visit: Payer: Self-pay | Admitting: Family Medicine

## 2017-09-30 NOTE — Telephone Encounter (Signed)
Patient was supposed to have been seen shortly after last appt, late November Please find out if she is in a facility, is another doctor managing her care now Thank you

## 2017-09-30 NOTE — Telephone Encounter (Signed)
Per the appt desk pt cancelled her upcoming appt with you (was scheduled for 09/19/17) due to lack of transportation. I called the pt to verify her situation. There was no answer. LM for pt to call back and clarify.

## 2017-10-13 ENCOUNTER — Telehealth: Payer: Self-pay

## 2017-10-13 ENCOUNTER — Ambulatory Visit: Payer: PPO | Admitting: Family Medicine

## 2017-10-13 ENCOUNTER — Encounter: Payer: Self-pay | Admitting: Family Medicine

## 2017-10-13 VITALS — BP 132/70 | HR 90 | Temp 98.5°F | Ht 65.0 in | Wt 165.4 lb

## 2017-10-13 DIAGNOSIS — R251 Tremor, unspecified: Secondary | ICD-10-CM

## 2017-10-13 DIAGNOSIS — I10 Essential (primary) hypertension: Secondary | ICD-10-CM

## 2017-10-13 DIAGNOSIS — Z23 Encounter for immunization: Secondary | ICD-10-CM

## 2017-10-13 DIAGNOSIS — E538 Deficiency of other specified B group vitamins: Secondary | ICD-10-CM | POA: Diagnosis not present

## 2017-10-13 DIAGNOSIS — R2681 Unsteadiness on feet: Secondary | ICD-10-CM | POA: Diagnosis not present

## 2017-10-13 MED ORDER — CYANOCOBALAMIN 1000 MCG/ML IJ SOLN
1000.0000 ug | Freq: Once | INTRAMUSCULAR | Status: AC
  Administered 2017-10-13: 1000 ug via INTRAMUSCULAR

## 2017-10-13 NOTE — Telephone Encounter (Signed)
Just realized that the request was for Atorvastatin from the pharmacy looks like pt is no longer taking this. Please disregard. Thanks! Sorry.

## 2017-10-13 NOTE — Assessment & Plan Note (Signed)
Check on PT referral from November

## 2017-10-13 NOTE — Telephone Encounter (Signed)
Which medicine?

## 2017-10-13 NOTE — Assessment & Plan Note (Signed)
Seeing neurologist in 2 weeks

## 2017-10-13 NOTE — Telephone Encounter (Signed)
Pharmacy requesting a 90 day supply to help pt better comply. Please advise. Thanks!

## 2017-10-13 NOTE — Assessment & Plan Note (Signed)
Controlled on just the one medicine

## 2017-10-13 NOTE — Patient Instructions (Addendum)
Check with Kristeen Miss up front about the home PT orders You have received a B12 shot today; we can do that once a month for energy Please do see the neurologist as soon as possible You have received the Prevnar vaccine (PCV-13) and you will not need another booster of this for the rest of your life per current ACIP guidelines Let me know how I can help with assisted living placement

## 2017-10-13 NOTE — Assessment & Plan Note (Signed)
B12 shot today and then once a month

## 2017-10-13 NOTE — Progress Notes (Signed)
BP 132/70 (BP Location: Right Arm, Patient Position: Sitting, Cuff Size: Large)   Pulse 90   Temp 98.5 F (36.9 C) (Oral)   Ht 5\' 5"  (1.651 m)   Wt 165 lb 6.4 oz (75 kg)   SpO2 99%   BMI 27.52 kg/m    Subjective:    Patient ID: Kristina Simmons, female    DOB: Feb 04, 1934, 83 y.o.   MRN: [id number]  HPI: Kristina Simmons is a 83 y.o. female  Chief Complaint  Patient presents with  . Follow-up    feeling weak, not eating well, not sleeping well  . Immunizations    discuss PNA     HPI Patient is here with her son for f/u  She is not taking the blood pressure medicine; just on the metoprolol; does not take the amlodipine; no side effects; tries to limit salt  I filled out [place] papers earlier and they are not yet in a facility (she and her husband) They have not gotten a chance to see any facilities yet [place] about [place]  Patient has no record in our system of having had a pneumonia vaccine; they are on the fence about it; she thinks she got one from Dr. Rutherford Nail years ago  Feels weak; appt with neurologist soon in January; not eating well; weight stable; does not walk much; just lays around and sleeps; walks for 10 minutes and is wore out; pace is slow and shuffling; only movement is at home; home-bound; cannot walk outside because [place]; they never heard about the home PT  Vitamin D deficiency; taking the once a week supplement   Depression screen [facility] 2/9 10/13/2017 09/01/2017 07/22/2016 03/12/2016 06/17/2015  Decreased Interest 1 2 0 0 0  Down, Depressed, Hopeless 1 2 1 1  0  PHQ - 2 Score 2 4 1 1  0  Altered sleeping 1 1 - - -  Tired, decreased energy 2 1 - - -  Change in appetite 1 2 - - -  Feeling bad or failure about yourself  0 0 - - -  Trouble concentrating 0 0 - - -  Moving slowly or fidgety/restless 0 0 - - -  Suicidal thoughts 0 0 - - -  PHQ-9 Score 6 8 - - -  Difficult  doing work/chores - Very difficult - - -    Relevant past medical, surgical, family and social history reviewed Past Medical History:  Diagnosis Date  . Arthritis   . Breast cancer ([place]) 2010   DCIS; diagnosed on January 15, 2009. The patient had originally undergone a stereotactic biopsy for microcalcifications. Atypical hyperplasia was identified. Wide excision was completed to confirm the presence or absence of DCIS or invasive tumor. The patient had a 0.5 cm foci of DCIS adjacent to the previous biopsy site. This was a grade 1 lesion with focal necrosis. ER 90%, PR 50%   . Breast screening, unspecified   . Bursitis    left hip  . Fall   . Fibromyalgia 2011  . Humerus fracture Mar 01, 2016   left  . Kidney stones   . Personal history of malignant neoplasm of breast 2010   left breast cancer; status post wide excision with mastoplasty followed by whole breast radiation and tamoxifen  . Personal history of radiation therapy 2010   BREAST CA  . Sleep apnea 2006  . Special screening for malignant neoplasms, colon   .  Tremor 2011  . Undiagnosed cardiac murmurs   . Unspecified essential hypertension    Past Surgical History:  Procedure Laterality Date  . ABDOMINAL HYSTERECTOMY  1974  . APPENDECTOMY  1974  . BREAST EXCISIONAL BIOPSY Left 2010   breast cancer  . BREAST SURGERY Left 2010   left breast wide excision mastoplasty  . COLONOSCOPY  2004  . LITHOTRIPSY  2008  . status post radiation therapy Left    breast cancer  . UPPER GI ENDOSCOPY  May 28, 2010   Biopsy showed evidence of mild chronic gastritis. There was no evidence of H. Pylori infection. A normal duodenum was reported.    Family History  Problem Relation Age of Onset  . Thyroid disease Mother   . Heart disease Father   . Breast cancer Neg Hx    Social History   Tobacco Use  . Smoking status: Never Smoker  . Smokeless tobacco: Never Used  Substance Use Topics  . Alcohol use: No  . Drug use: No     Interim medical history since last visit reviewed. Allergies and medications reviewed  Review of Systems Per HPI unless specifically indicated above     Objective:    BP 132/70 (BP Location: Right Arm, Patient Position: Sitting, Cuff Size: Large)   Pulse 90   Temp 98.5 F (36.9 C) (Oral)   Ht 5\' 5"  (1.651 m)   Wt 165 lb 6.4 oz (75 kg)   SpO2 99%   BMI 27.52 kg/m   Wt Readings from Last 3 Encounters:  10/13/17 165 lb 6.4 oz (75 kg)  09/01/17 165 lb 8 oz (75.1 kg)  07/22/16 179 lb (81.2 kg)    Physical Exam  Constitutional: She appears well-developed and well-nourished. No distress.  Weight loss noted  HENT:  Head: Normocephalic and atraumatic.  Eyes: EOM are normal. No scleral icterus.  Neck: No thyromegaly present.  Cardiovascular: Normal rate and regular rhythm.  Pulmonary/Chest: Effort normal and breath sounds normal. No respiratory distress.  Abdominal: Soft. Bowel sounds are normal. She exhibits no distension.  Musculoskeletal: Normal range of motion. She exhibits no edema.  Neurological: She is alert. She displays tremor.  resting tremor noted of hands and lip when distracted; smiles a little but otherwise has rather masked facies; paucity of movement altogether  Skin: Skin is warm and dry. No pallor.  Mildly erythematous / dark pink scar over the left lateral eyebrow  Psychiatric: Her behavior is normal. Judgment and thought content normal. Her affect is blunt. Her speech is not slurred.   Results for orders placed or performed in visit on 09/01/17  CBC with Differential/Platelet  Result Value Ref Range   WBC 8.7 3.8 - 10.8 Thousand/uL   RBC 5.09 3.80 - 5.10 Million/uL   Hemoglobin 16.2 (H) 11.7 - 15.5 g/dL   HCT 47.3 (H) 35.0 - 45.0 %   MCV 92.9 80.0 - 100.0 fL   MCH 31.8 27.0 - 33.0 pg   MCHC 34.2 32.0 - 36.0 g/dL   RDW 12.0 11.0 - 15.0 %   Platelets 199 140 - 400 Thousand/uL   MPV 10.6 7.5 - 12.5 fL   Neutro Abs 6,516 1,500 - 7,800 cells/uL    Lymphs Abs 1,444 850 - 3,900 cells/uL   WBC mixed population 618 200 - 950 cells/uL   Eosinophils Absolute 70 15 - 500 cells/uL   Basophils Absolute 52 0 - 200 cells/uL   Neutrophils Relative % 74.9 %   Total Lymphocyte 16.6 %  Monocytes Relative 7.1 %   Eosinophils Relative 0.8 %   Basophils Relative 0.6 %  COMPLETE METABOLIC PANEL WITH GFR  Result Value Ref Range   Glucose, Bld 84 65 - 99 mg/dL   BUN 19 7 - 25 mg/dL   Creat 0.91 (H) 0.60 - 0.88 mg/dL   GFR, Est Non African American 60 > OR = 60 mL/min/1.32m2   GFR, Est African American 69 > OR = 60 mL/min/1.19m2   BUN/Creatinine Ratio 21 6 - 22 (calc)   Sodium 139 135 - 146 mmol/L   Potassium 4.2 3.5 - 5.3 mmol/L   Chloride 105 98 - 110 mmol/L   CO2 26 20 - 32 mmol/L   Calcium 9.9 8.6 - 10.4 mg/dL   Total Protein 7.2 6.1 - 8.1 g/dL   Albumin 4.3 3.6 - 5.1 g/dL   Globulin 2.9 1.9 - 3.7 g/dL (calc)   AG Ratio 1.5 1.0 - 2.5 (calc)   Total Bilirubin 0.8 0.2 - 1.2 mg/dL   Alkaline phosphatase (APISO) 66 33 - 130 U/L   AST 22 10 - 35 U/L   ALT 20 6 - 29 U/L  TSH  Result Value Ref Range   TSH 0.65 0.40 - 4.50 mIU/L  VITAMIN D 25 Hydroxy (Vit-D Deficiency, Fractures)  Result Value Ref Range   Vit D, 25-Hydroxy 21 (L) 30 - 100 ng/mL  B12  Result Value Ref Range   Vitamin B-12 379 200 - 1,100 pg/mL  Lipid panel  Result Value Ref Range   Cholesterol 158 <200 mg/dL   HDL 46 (L) >50 mg/dL   Triglycerides 178 (H) <150 mg/dL   LDL Cholesterol (Calc) 85 mg/dL (calc)   Total CHOL/HDL Ratio 3.4 <5.0 (calc)   Non-HDL Cholesterol (Calc) 112 <130 mg/dL (calc)      Assessment & Plan:   Problem List Items Addressed This Visit      Cardiovascular and Mediastinum   Benign hypertension - Primary (Chronic)    Controlled on just the one medicine        Other   Vitamin B12 deficiency    B12 shot today and then once a month      Relevant Medications   cyanocobalamin ((VITAMIN B-12)) injection 1,000 mcg (Completed)   Has a  tremor    Seeing neurologist in 2 weeks      Gait instability    Check on PT referral from November       Other Visit Diagnoses    Need for vaccination with 13-polyvalent pneumococcal conjugate vaccine       Relevant Orders   Pneumococcal conjugate vaccine 13-valent IM (Completed)       Follow up plan: Return in about 1 month (around 11/13/2017) for follow-up visit with Dr. Sanda Klein.  An after-visit summary was printed and given to the patient at [place].  Please see the patient instructions which may contain other information and recommendations beyond what is mentioned above in the assessment and plan.  Meds ordered this encounter  Medications  . cyanocobalamin ((VITAMIN B-12)) injection 1,000 mcg    Orders Placed This Encounter  Procedures  . Pneumococcal conjugate vaccine 13-valent IM   Face-to-face time with patient was more than 25 minutes, >50% time spent counseling and coordination of care

## 2017-10-27 DIAGNOSIS — H539 Unspecified visual disturbance: Secondary | ICD-10-CM | POA: Diagnosis not present

## 2017-10-27 DIAGNOSIS — R251 Tremor, unspecified: Secondary | ICD-10-CM | POA: Diagnosis not present

## 2017-10-27 DIAGNOSIS — R2681 Unsteadiness on feet: Secondary | ICD-10-CM | POA: Diagnosis not present

## 2017-10-27 DIAGNOSIS — R5383 Other fatigue: Secondary | ICD-10-CM | POA: Insufficient documentation

## 2017-11-10 ENCOUNTER — Ambulatory Visit: Payer: Self-pay | Admitting: Family Medicine

## 2017-11-18 ENCOUNTER — Ambulatory Visit: Payer: Self-pay | Admitting: Family Medicine

## 2018-03-08 ENCOUNTER — Other Ambulatory Visit: Payer: Self-pay | Admitting: Family Medicine

## 2018-06-08 ENCOUNTER — Other Ambulatory Visit: Payer: Self-pay | Admitting: Family Medicine

## 2018-06-08 DIAGNOSIS — Z1231 Encounter for screening mammogram for malignant neoplasm of breast: Secondary | ICD-10-CM

## 2018-06-16 ENCOUNTER — Other Ambulatory Visit: Payer: Self-pay | Admitting: Family Medicine

## 2018-06-16 NOTE — Telephone Encounter (Signed)
Last ov:12/27 Next 10/2

## 2018-06-21 ENCOUNTER — Other Ambulatory Visit: Payer: Self-pay | Admitting: Family Medicine

## 2018-06-21 ENCOUNTER — Ambulatory Visit
Admission: RE | Admit: 2018-06-21 | Discharge: 2018-06-21 | Disposition: A | Discharge status: Home / Self Care | Payer: PPO | Source: Ambulatory Visit | Attending: Family Medicine | Admitting: Family Medicine

## 2018-06-21 DIAGNOSIS — R928 Other abnormal and inconclusive findings on diagnostic imaging of breast: Secondary | ICD-10-CM

## 2018-06-21 DIAGNOSIS — Z1231 Encounter for screening mammogram for malignant neoplasm of breast: Secondary | ICD-10-CM | POA: Diagnosis not present

## 2018-06-29 ENCOUNTER — Ambulatory Visit
Admission: RE | Admit: 2018-06-29 | Discharge: 2018-06-29 | Disposition: A | Discharge status: Home / Self Care | Payer: PPO | Source: Ambulatory Visit | Attending: Family Medicine | Admitting: Family Medicine

## 2018-06-29 ENCOUNTER — Other Ambulatory Visit: Payer: Self-pay | Admitting: Family Medicine

## 2018-06-29 DIAGNOSIS — R928 Other abnormal and inconclusive findings on diagnostic imaging of breast: Secondary | ICD-10-CM

## 2018-06-29 DIAGNOSIS — N644 Mastodynia: Secondary | ICD-10-CM | POA: Diagnosis not present

## 2018-07-04 DIAGNOSIS — H11431 Conjunctival hyperemia, right eye: Secondary | ICD-10-CM | POA: Diagnosis not present

## 2018-07-10 ENCOUNTER — Other Ambulatory Visit: Payer: Self-pay

## 2018-07-10 ENCOUNTER — Encounter: Payer: Self-pay | Admitting: Medical Oncology

## 2018-07-10 ENCOUNTER — Ambulatory Visit
Admission: RE | Admit: 2018-07-10 | Discharge: 2018-07-10 | Disposition: A | Discharge status: Home / Self Care | Payer: PPO | Source: Ambulatory Visit | Attending: Family Medicine | Admitting: Family Medicine

## 2018-07-10 ENCOUNTER — Ambulatory Visit: Payer: Self-pay | Admitting: Family Medicine

## 2018-07-10 ENCOUNTER — Emergency Department
Admission: EM | Admit: 2018-07-10 | Discharge: 2018-07-10 | Payer: PPO | Attending: Emergency Medicine | Admitting: Emergency Medicine

## 2018-07-10 DIAGNOSIS — Z853 Personal history of malignant neoplasm of breast: Secondary | ICD-10-CM | POA: Insufficient documentation

## 2018-07-10 DIAGNOSIS — R928 Other abnormal and inconclusive findings on diagnostic imaging of breast: Secondary | ICD-10-CM | POA: Insufficient documentation

## 2018-07-10 DIAGNOSIS — R55 Syncope and collapse: Secondary | ICD-10-CM | POA: Diagnosis not present

## 2018-07-10 DIAGNOSIS — N6091 Unspecified benign mammary dysplasia of right breast: Secondary | ICD-10-CM | POA: Diagnosis not present

## 2018-07-10 DIAGNOSIS — Z79899 Other long term (current) drug therapy: Secondary | ICD-10-CM | POA: Diagnosis not present

## 2018-07-10 DIAGNOSIS — R921 Mammographic calcification found on diagnostic imaging of breast: Secondary | ICD-10-CM | POA: Diagnosis not present

## 2018-07-10 DIAGNOSIS — N6489 Other specified disorders of breast: Secondary | ICD-10-CM | POA: Diagnosis not present

## 2018-07-10 HISTORY — PX: BREAST BIOPSY: SHX20

## 2018-07-10 LAB — CBC
HCT: 42.4 % (ref 35.0–47.0)
HEMOGLOBIN: 14.7 g/dL (ref 12.0–16.0)
MCH: 33.6 pg (ref 26.0–34.0)
MCHC: 34.7 g/dL (ref 32.0–36.0)
MCV: 97 fL (ref 80.0–100.0)
Platelets: 202 10*3/uL (ref 150–440)
RBC: 4.37 MIL/uL (ref 3.80–5.20)
RDW: 12.8 % (ref 11.5–14.5)
WBC: 7.7 10*3/uL (ref 3.6–11.0)

## 2018-07-10 LAB — COMPREHENSIVE METABOLIC PANEL
ALK PHOS: 48 U/L (ref 38–126)
ALT: 25 U/L (ref 0–44)
AST: 31 U/L (ref 15–41)
Albumin: 4.1 g/dL (ref 3.5–5.0)
Anion gap: 9 (ref 5–15)
BUN: 15 mg/dL (ref 8–23)
CALCIUM: 9.3 mg/dL (ref 8.9–10.3)
CO2: 25 mmol/L (ref 22–32)
Chloride: 105 mmol/L (ref 98–111)
Creatinine, Ser: 1.08 mg/dL — ABNORMAL HIGH (ref 0.44–1.00)
GFR, EST AFRICAN AMERICAN: 54 mL/min — AB (ref >60)
GFR, EST NON AFRICAN AMERICAN: 47 mL/min — AB (ref >60)
Glucose, Bld: 122 mg/dL — ABNORMAL HIGH (ref 70–99)
Potassium: 3.9 mmol/L (ref 3.5–5.1)
Sodium: 139 mmol/L (ref 135–145)
Total Bilirubin: 0.7 mg/dL (ref 0.3–1.2)
Total Protein: 7.2 g/dL (ref 6.5–8.1)

## 2018-07-10 LAB — TROPONIN I: Troponin I: <0.03 ng/mL (ref <0.03)

## 2018-07-10 LAB — GLUCOSE, CAPILLARY: Glucose-Capillary: 111 mg/dL — ABNORMAL HIGH (ref 70–99)

## 2018-07-10 MED ORDER — SODIUM CHLORIDE 0.9 % IV SOLN
1000.0000 mL | Freq: Once | INTRAVENOUS | Status: AC
  Administered 2018-07-10: 1000 mL via INTRAVENOUS

## 2018-07-10 NOTE — ED Triage Notes (Signed)
Pt from breast center where pt was doing rt sided breast biopsy when she had a syncopal episode in a chair. When code team arrived to patient, her bp was 143/88 HR was 68 and sats 99 RA. Pt was NPO prior to procedure. Pt lethargic and slow to respond to verbal stimuli.

## 2018-07-10 NOTE — ED Provider Notes (Signed)
[facility] Emergency Department Provider Note   ____________________________________________    I have reviewed the triage vital signs and the nursing notes.   HISTORY  Chief Complaint Loss of Consciousness     HPI Kristina Simmons is a 84 y.o. female who presents after a syncopal episode.  Patient reports that she was getting a biopsy of a breast lesion at the mammography suite in the hospital.  Apparently she became lightheaded broke out in a sweat and syncopized while seated.  Rapid response was called and the patient was brought to the emergency department.  She feels significantly better upon arrival to the emergency department.  She reports that she has had syncopal episodes in the past which were similar.  She denies chest pain shortness of breath or palpitations.  Did not eat or drink anything this morning  Past Medical History:  Diagnosis Date  . Arthritis   . Breast cancer ([place]) 2010   DCIS; diagnosed on January 15, 2009. Left   . Breast screening, unspecified   . Bursitis    left hip  . Fall   . Fibromyalgia 2011  . Humerus fracture Mar 01, 2016   left  . Kidney stones   . Personal history of malignant neoplasm of breast 2010   left breast cancer; status post wide excision with mastoplasty followed by whole breast radiation and tamoxifen  . Personal history of radiation therapy 2010   BREAST CA  . Sleep apnea 2006  . Special screening for malignant neoplasms, colon   . Tremor 2011  . Undiagnosed cardiac murmurs   . Unspecified essential hypertension     Patient Active Problem List   Diagnosis Date Noted  . Vitamin B12 deficiency 10/13/2017  . Gait instability 09/01/2017  . Medication monitoring encounter 09/01/2017  . Small vessel disease, cerebrovascular 09/01/2017  . Cervical spine degeneration 09/01/2017  . Encounter for completion of form with patient 09/01/2017  . History of left breast cancer 09/01/2017  . Neoplasm  of uncertain behavior of skin 08/12/2016  . Heartburn 07/27/2016  . Vitamin D deficiency 07/27/2016  . Benign hypertension 07/22/2016  . Humerus fracture 03/01/2016  . Has a tremor 05/27/2015    Past Surgical History:  Procedure Laterality Date  . ABDOMINAL HYSTERECTOMY  1974  . APPENDECTOMY  1974  . BREAST BIOPSY Right 07/10/2018   RIGHT Affirm Bx ("X" clip) path pending  . BREAST EXCISIONAL BIOPSY Left 2010   breast cancer  . BREAST SURGERY Left 2010   left breast wide excision mastoplasty  . COLONOSCOPY  2004  . LITHOTRIPSY  2008  . status post radiation therapy Left    breast cancer  . UPPER GI ENDOSCOPY  May 28, 2010   Biopsy showed evidence of mild chronic gastritis. There was no evidence of H. Pylori infection. A normal duodenum was reported.     Prior to Admission medications   Medication Sig Start Date End Date Taking? Authorizing Provider  Cholecalciferol (VITAMIN D-1000 MAX ST) 1000 units tablet Take 1,000 Units by mouth daily.   Yes [provider]  metoprolol succinate (TOPROL-XL) 25 MG 24 hr tablet TAKE 1 TABLET BY MOUTH EVERY DAY 06/16/18  Yes Poulose, Bethel Born, NP  vitamin B-12 (CYANOCOBALAMIN) 1000 MCG tablet Take 1,000 mcg by mouth daily.   Yes [provider]     Allergies Patient has no known allergies.  Family History  Problem Relation Age of Onset  . Thyroid disease Mother   .  Heart disease Father   . Breast cancer Neg Hx     Social History Social History   Tobacco Use  . Smoking status: Never Smoker  . Smokeless tobacco: Never Used  Substance Use Topics  . Alcohol use: No  . Drug use: No    Review of Systems  Constitutional: No fever/chills Eyes: No visual changes.  ENT: No sore throat. Cardiovascular: Denies chest pain.  Palpitations Respiratory: Denies shortness of breath. Gastrointestinal: No abdominal pain.  Genitourinary: Negative for dysuria. Musculoskeletal: Negative for back pain. Skin: Negative for  rash. Neurological: Negative for headaches    ____________________________________________   PHYSICAL EXAM:  VITAL SIGNS: ED Triage Vitals [07/10/18 0924]  Enc Vitals Group     BP 137/73     Pulse Rate 98     Resp 18     Temp      Temp Source Oral     SpO2 96 %     Weight 75 kg (165 lb 5.5 oz)     Height      Head Circumference      Peak Flow      Pain Score 0     Pain Loc      Pain Edu?      Excl. in [place]?     Constitutional: Alert and oriented.  Eyes: Conjunctivae are normal.  Head: Atraumatic. Nose: No congestion/rhinnorhea. Mouth/Throat: Mucous membranes are moist.   Neck:  Painless ROM Cardiovascular: Normal rate, regular rhythm. Grossly normal heart sounds.  Good peripheral circulation. Respiratory: Normal respiratory effort.  No retractions. Lungs CTAB. GI: No abdominal tenderness to palpation, no distention  Musculoskeletal: No lower extremity tenderness nor edema.  Warm and well perfused Neurologic:  Normal speech and language. No gross focal neurologic deficits are appreciated.  Skin:  Skin is warm, dry and intact. No rash noted. Psychiatric: Mood and affect are normal. Speech and behavior are normal.  ____________________________________________   LABS (all labs ordered are listed, but only abnormal results are displayed)  Labs Reviewed  GLUCOSE, CAPILLARY - Abnormal; Notable for the following components:      Result Value   Glucose-Capillary 111 (*)    All other components within normal limits  COMPREHENSIVE METABOLIC PANEL - Abnormal; Notable for the following components:   Glucose, Bld 122 (*)    Creatinine, Ser 1.08 (*)    GFR calc non Af Amer 47 (*)    GFR calc Af Amer 54 (*)    All other components within normal limits  CBC  TROPONIN I  CBG MONITORING, ED    ____________________________________________  EKG   ____________________________________________  RADIOLOGY  None ____________________________________________   PROCEDURES  Procedure(s) performed: No  Procedures   Critical Care performed: No ____________________________________________   INITIAL IMPRESSION / ASSESSMENT AND PLAN / ED COURSE  Pertinent labs & imaging results that were available during my care of the patient were reviewed by me and considered in my medical decision making (see chart for details).  Patient overall well-appearing and in no acute distress, vital signs are overall unremarkable.  Exam is reassuring.  Will check labs give IV fluids placed in the cardiac monitor and reevaluate  Lab work is unremarkable, no arrhythmias during monitoring.  Patient feels much better after IV fluids.  No indication for further work-up at this time, appropriate for discharge with outpatient follow-up, patient and her son are content with this plan    ____________________________________________   FINAL CLINICAL IMPRESSION(S) / ED DIAGNOSES  Final diagnoses:  Vasovagal  syncope        Note:  This document was prepared using Dragon voice recognition software and may include unintentional dictation errors.    Lavonia Drafts, MD 07/10/18 1218

## 2018-07-10 NOTE — ED Notes (Signed)
Pt transported back to mammography

## 2018-07-10 NOTE — ED Notes (Signed)
Pt son reports that 6-7 years ago the pt had a breast biopsy of the left breast and was placed on "cancer medication" for 5 years - he reports that since that medication the pt has been having episode of syncope and this is the 3-4 time - the pt taste has changed since the medication and her appetite has been poor The pt reports that she did not eat breakfast this morning and that after the biopsy on the right breast she felt nauseous and dizzy prior to syncopal episode

## 2018-07-10 NOTE — Progress Notes (Signed)
Chaplain responded to an overhead medical alert in Mammalogy room 3. Pt was being attended with no family. Chaplain offered silence prayer. No family was available. Chaplain accessed asked if there were any needs and none was spoken.    07/10/18 0925  Clinical Encounter Type  Visited With Patient;Health care provider  Visit Type Code  Referral From Nurse  Advance Directives (For Healthcare)  Does Patient Have a Medical Advance Directive? No  Mental Health Advance Directives  Does Patient Have a Mental Health Advance Directive? No

## 2018-07-11 LAB — SURGICAL PATHOLOGY

## 2018-07-12 ENCOUNTER — Telehealth: Payer: Self-pay | Admitting: Family Medicine

## 2018-07-12 NOTE — Telephone Encounter (Signed)
Thank you :)

## 2018-07-12 NOTE — Telephone Encounter (Signed)
-----   Message from [place], [place] sent at 07/12/2018  4:55 PM EDT ----- Dr. Sanda Klein, We received the results on Ms. Abadi's breast biopsy today.  She has a complex sclerosing lesion which is concordant with her image findings by Dr. Augustin Coupe and the recommendation is for surgical referral for excision.  I discussed the results and recommendations with Ms. Cianci son Shi Grose.  He was going to talk to his mother.  I gave him my number to call me back if she will allow [place] to make the surgical referral appointment.  He said she may want to wait and talk to you about this before she will agree to go see a surgeon as she has an appointment with you on Wednesday.  If he calls back before then, I will get the appointment made for her.   Thank you. Jetta Lout, [facility] Radiology for [facility] at [facility].

## 2018-07-19 ENCOUNTER — Ambulatory Visit (INDEPENDENT_AMBULATORY_CARE_PROVIDER_SITE_OTHER): Payer: PPO | Admitting: Family Medicine

## 2018-07-19 ENCOUNTER — Encounter: Payer: Self-pay | Admitting: Family Medicine

## 2018-07-19 VITALS — BP 128/70 | HR 86 | Temp 98.6°F | Ht 65.0 in | Wt 157.1 lb

## 2018-07-19 DIAGNOSIS — Z853 Personal history of malignant neoplasm of breast: Secondary | ICD-10-CM | POA: Diagnosis not present

## 2018-07-19 DIAGNOSIS — R2681 Unsteadiness on feet: Secondary | ICD-10-CM

## 2018-07-19 DIAGNOSIS — M8588 Other specified disorders of bone density and structure, other site: Secondary | ICD-10-CM | POA: Diagnosis not present

## 2018-07-19 DIAGNOSIS — N6091 Unspecified benign mammary dysplasia of right breast: Secondary | ICD-10-CM

## 2018-07-19 DIAGNOSIS — M8580 Other specified disorders of bone density and structure, unspecified site: Secondary | ICD-10-CM | POA: Insufficient documentation

## 2018-07-19 DIAGNOSIS — E538 Deficiency of other specified B group vitamins: Secondary | ICD-10-CM

## 2018-07-19 DIAGNOSIS — Z23 Encounter for immunization: Secondary | ICD-10-CM

## 2018-07-19 DIAGNOSIS — I10 Essential (primary) hypertension: Secondary | ICD-10-CM

## 2018-07-19 DIAGNOSIS — N183 Chronic kidney disease, stage 3 unspecified: Secondary | ICD-10-CM

## 2018-07-19 DIAGNOSIS — E559 Vitamin D deficiency, unspecified: Secondary | ICD-10-CM | POA: Diagnosis not present

## 2018-07-19 DIAGNOSIS — Z5181 Encounter for therapeutic drug level monitoring: Secondary | ICD-10-CM | POA: Diagnosis not present

## 2018-07-19 DIAGNOSIS — R634 Abnormal weight loss: Secondary | ICD-10-CM

## 2018-07-19 DIAGNOSIS — G20 Parkinson's disease: Secondary | ICD-10-CM | POA: Diagnosis not present

## 2018-07-19 DIAGNOSIS — G20C Parkinsonism, unspecified: Secondary | ICD-10-CM

## 2018-07-19 MED ORDER — METOPROLOL SUCCINATE ER 25 MG PO TB24: 25.0000 mg | ORAL_TABLET | Freq: Every day | ORAL | 3 refills | Status: DC

## 2018-07-19 NOTE — Progress Notes (Signed)
BP 128/70   Pulse 86   Temp 98.6 F (37 C)   Ht 5\' 5"  (1.651 m)   Wt 157 lb 1.6 oz (71.3 kg)   SpO2 97%   BMI 26.14 kg/m    Subjective:    Patient ID: Kristina Simmons, female    DOB: 1934/10/16, 84 y.o.   MRN: [id number]  HPI: BERNARD DONAHOO is a 84 y.o. female  Chief Complaint  Patient presents with  . Follow-up  . Medication Refill    HPI  She underwent right breast biopsy; she does not want any more treatments for her breast and does not want surgery; she had a lot of pain with the biopsy; her son is not here with her today; he is supporting her decision for no further treatment; she is not even interested in surgery or chemo or radiation, but would be open to a pill if that was an option  She is living alone right now; husband passed away; he would like to try assited living in the future; safe at home right now; two story house, but tries to not go upstairs; son moved the bed downstairs; no AC downstairs; doing her laundry right now; doing okay by herself right now  HTN; no problems with her medicines  DEXA; she practices good fall precautions; not much calcium but does bring a carton of milk; few greens; taking vit D daily  She has lost some weight since her husband died; I asked about food insecurity; no food insecuirity; getting meals on wheels; appetite is not good, pretty much been that way for a long time; she eats well when going out with friends  CKD stage 3; last GFR 47; takes tylenol but no NSAIDS; not a great water drinker  CBC normal  Low vitamin B12 last year; 379 (goal is over 400); taking supplement  Vitamin D was only 21; on supplement  Depression screen [facility] 2/9 07/19/2018 07/19/2018 10/13/2017 09/01/2017 07/22/2016  Decreased Interest 0 0 1 2 0  Down, Depressed, Hopeless 0 0 1 2 1   PHQ - 2 Score 0 0 2 4 1   Altered sleeping 3 - 1 1 -  Tired, decreased energy 3 - 2 1 -  Change in appetite 3 - 1 2 -  Feeling bad or failure about yourself   3 - 0 0 -  Trouble concentrating 3 - 0 0 -  Moving slowly or fidgety/restless 0 - 0 0 -  Suicidal thoughts 0 - 0 0 -  PHQ-9 Score 15 - 6 8 -  Difficult doing work/chores Somewhat difficult - - Very difficult -   Fall Risk  07/19/2018 10/13/2017 09/01/2017 07/22/2016 03/12/2016  Falls in the past year? Yes No Yes Yes Yes  Number falls in past yr: 2 or more - 1 2 or more 1  Injury with Fall? Yes - - Yes Yes  Follow up - - - - -    Relevant past medical, surgical, family and social history reviewed Past Medical History:  Diagnosis Date  . Arthritis   . Breast cancer ([place]) 2010   DCIS; diagnosed on January 15, 2009. Left   . Breast screening, unspecified   . Bursitis    left hip  . Fall   . Fibromyalgia 2011  . Humerus fracture Mar 01, 2016   left  . Kidney stones   . Personal history of malignant neoplasm of breast 2010   left breast cancer; status post wide excision with mastoplasty followed  by whole breast radiation and tamoxifen  . Personal history of radiation therapy 2010   BREAST CA  . Sleep apnea 2006  . Special screening for malignant neoplasms, colon   . Tremor 2011  . Undiagnosed cardiac murmurs   . Unspecified essential hypertension    Past Surgical History:  Procedure Laterality Date  . ABDOMINAL HYSTERECTOMY  1974  . APPENDECTOMY  1974  . BREAST BIOPSY Right 07/10/2018   RIGHT Affirm Bx ("X" clip) path pending  . BREAST EXCISIONAL BIOPSY Left 2010   breast cancer  . BREAST SURGERY Left 2010   left breast wide excision mastoplasty  . COLONOSCOPY  2004  . LITHOTRIPSY  2008  . status post radiation therapy Left    breast cancer  . UPPER GI ENDOSCOPY  May 28, 2010   Biopsy showed evidence of mild chronic gastritis. There was no evidence of H. Pylori infection. A normal duodenum was reported.    Family History  Problem Relation Age of Onset  . Thyroid disease Mother   . Heart disease Father   . Breast cancer Neg Hx    Social History   Tobacco Use  .  Smoking status: Never Smoker  . Smokeless tobacco: Never Used  Substance Use Topics  . Alcohol use: No  . Drug use: No     Office Visit from 07/19/2018 in [facility]  AUDIT-C Score  0      Interim medical history since last visit reviewed. Allergies and medications reviewed  Review of Systems Per HPI unless specifically indicated above     Objective:    BP 128/70   Pulse 86   Temp 98.6 F (37 C)   Ht 5\' 5"  (1.651 m)   Wt 157 lb 1.6 oz (71.3 kg)   SpO2 97%   BMI 26.14 kg/m   Wt Readings from Last 3 Encounters:  07/19/18 157 lb 1.6 oz (71.3 kg)  07/10/18 165 lb 5.5 oz (75 kg)  10/13/17 165 lb 6.4 oz (75 kg)    Physical Exam  Constitutional: She appears well-developed and well-nourished. No distress.  HENT:  Head: Normocephalic and atraumatic.  Eyes: EOM are normal. No scleral icterus.  Neck: No thyromegaly present.  Cardiovascular: Normal rate, regular rhythm and normal heart sounds.  No murmur heard. Pulmonary/Chest: Effort normal and breath sounds normal. No respiratory distress. She has no wheezes.  Abdominal: Soft. Bowel sounds are normal. She exhibits no distension.  Musculoskeletal: She exhibits no edema.  Neurological: She is alert.  Masked facies; very little spontaneous movement  Skin: Skin is warm and dry. She is not diaphoretic. No pallor.  Psychiatric: Her behavior is normal. Judgment and thought content normal. Her mood appears not anxious. Her affect is blunt. She does not exhibit a depressed mood.       Assessment & Plan:   Problem List Items Addressed This Visit      Cardiovascular and Mediastinum   Benign hypertension (Chronic)   Relevant Medications   metoprolol succinate (TOPROL-XL) 25 MG 24 hr tablet     Musculoskeletal and Integument   Osteopenia    Check DEXA; continue vitamin D; practice fall precautions; discussed calcium in diet      Relevant Orders   DG Bone Density     Other   Vitamin D deficiency    Relevant Orders   VITAMIN D 25 Hydroxy (Vit-D Deficiency, Fractures) (Completed)   Medication monitoring encounter   Relevant Orders   COMPLETE METABOLIC PANEL WITH GFR (  Completed)   Vitamin B12 deficiency   Relevant Orders   Vitamin B12 (Completed)   Parkinsonian tremor (HCC)    Recommended referral to neurologist; explained medicine available      Relevant Orders   Ambulatory referral to Neurology   History of left breast cancer    Refer to oncologist      Gait instability    Fall precautions discussed      Atypical lobular hyperplasia (ALH) of right breast - Primary    Discussion about treatment that may include oral agent; she does not wish to have further surgery but is open to discussin gmedicine for treatment; referral to onc      Relevant Orders   Ambulatory referral to Oncology    Other Visit Diagnoses    Need for influenza vaccination       Relevant Orders   Flu vaccine HIGH DOSE PF (Fluzone High dose) (Completed)   CKD (chronic kidney disease) stage 3, GFR 30-59 ml/min (HCC)       Weight loss       husband died not long ago which may be part of this; however, will check TSH; further work-up in ongoing   Relevant Orders   TSH (Completed)       Follow up plan: Return in about 3 months (around 10/19/2018) for follow-up visit with Dr. Sanda Klein.  An after-visit summary was printed and given to the patient at [facility].  Please see the patient instructions which may contain other information and recommendations beyond what is mentioned above in the assessment and plan.  Meds ordered this encounter  Medications  . metoprolol succinate (TOPROL-XL) 25 MG 24 hr tablet    Sig: Take 1 tablet (25 mg total) by mouth daily.    Dispense:  90 tablet    Refill:  3    Orders Placed This Encounter  Procedures  . DG Bone Density  . Flu vaccine HIGH DOSE PF (Fluzone High dose)  . VITAMIN D 25 Hydroxy (Vit-D Deficiency, Fractures)  . Vitamin B12  . COMPLETE METABOLIC PANEL  WITH GFR  . TSH  . Ambulatory referral to Oncology  . Ambulatory referral to Neurology

## 2018-07-19 NOTE — Patient Instructions (Addendum)
Do consider assisted living and let me know if I can help We have staff resources to help with this  We'll have you see an oncologist to discuss your breast biopsy results and moving forward We'll have you see a neurologist about your tremor  Please do call to schedule your bone density study; the number to schedule one at either [facility] or [facility] is [phone] or [phone]  Practice good fall precautions   Fall Prevention in the Home Falls can cause injuries. They can happen to people of all ages. There are many things you can do to make your home safe and to help prevent falls. What can I do on the outside of my home?  Regularly fix the edges of walkways and driveways and fix any cracks.  Remove anything that might make you trip as you walk through a door, such as a raised step or threshold.  Trim any bushes or trees on the path to your home.  Use bright outdoor lighting.  Clear any walking paths of anything that might make someone trip, such as rocks or tools.  Regularly check to see if handrails are loose or broken. Make sure that both sides of any steps have handrails.  Any raised decks and porches should have guardrails on the edges.  Have any leaves, snow, or ice cleared regularly.  Use sand or salt on walking paths during winter.  Clean up any spills in your garage right away. This includes oil or grease spills. What can I do in the bathroom?  Use night lights.  Install grab bars by the toilet and in the tub and shower. Do not use towel bars as grab bars.  Use non-skid mats or decals in the tub or shower.  If you need to sit down in the shower, use a plastic, non-slip stool.  Keep the floor dry. Clean up any water that spills on the floor as soon as it happens.  Remove soap buildup in the tub or shower regularly.  Attach bath mats securely with double-sided non-slip rug tape.  Do not have throw rugs and other  things on the floor that can make you trip. What can I do in the bedroom?  Use night lights.  Make sure that you have a light by your bed that is easy to reach.  Do not use any sheets or blankets that are too big for your bed. They should not hang down onto the floor.  Have a firm chair that has side arms. You can use this for support while you get dressed.  Do not have throw rugs and other things on the floor that can make you trip. What can I do in the kitchen?  Clean up any spills right away.  Avoid walking on wet floors.  Keep items that you use a lot in easy-to-reach places.  If you need to reach something above you, use a strong step stool that has a grab bar.  Keep electrical cords out of the way.  Do not use floor polish or wax that makes floors slippery. If you must use wax, use non-skid floor wax.  Do not have throw rugs and other things on the floor that can make you trip. What can I do with my stairs?  Do not leave any items on the stairs.  Make sure that there are handrails on both sides of the stairs and use them. Fix handrails that are broken or loose. Make sure  that handrails are as long as the stairways.  Check any carpeting to make sure that it is firmly attached to the stairs. Fix any carpet that is loose or worn.  Avoid having throw rugs at the top or bottom of the stairs. If you do have throw rugs, attach them to the floor with carpet tape.  Make sure that you have a light switch at the top of the stairs and the bottom of the stairs. If you do not have them, ask someone to add them for you. What else can I do to help prevent falls?  Wear shoes that: ? Do not have high heels. ? Have rubber bottoms. ? Are comfortable and fit you well. ? Are closed at the toe. Do not wear sandals.  If you use a stepladder: ? Make sure that it is fully opened. Do not climb a closed stepladder. ? Make sure that both sides of the stepladder are locked into place. ? Ask  someone to hold it for you, if possible.  Clearly mark and make sure that you can see: ? Any grab bars or handrails. ? First and last steps. ? Where the edge of each step is.  Use tools that help you move around (mobility aids) if they are needed. These include: ? Canes. ? Walkers. ? Scooters. ? Crutches.  Turn on the lights when you go into a dark area. Replace any light bulbs as soon as they burn out.  Set up your furniture so you have a clear path. Avoid moving your furniture around.  If any of your floors are uneven, fix them.  If there are any pets around you, be aware of where they are.  Review your medicines with your doctor. Some medicines can make you feel dizzy. This can increase your chance of falling. Ask your doctor what other things that you can do to help prevent falls. This information is not intended to replace advice given to you by your health care provider. Make sure you discuss any questions you have with your health care provider. Document Released: 07/31/2009 Document Revised: 03/11/2016 Document Reviewed: 11/08/2014 Elsevier Interactive Patient Education  [employer].

## 2018-07-19 NOTE — Assessment & Plan Note (Signed)
Recommended referral to neurologist; explained medicine available

## 2018-07-19 NOTE — Assessment & Plan Note (Signed)
Refer to oncologist. 

## 2018-07-19 NOTE — Assessment & Plan Note (Signed)
Check DEXA; continue vitamin D; practice fall precautions; discussed calcium in diet

## 2018-07-20 LAB — COMPLETE METABOLIC PANEL WITH GFR
AG RATIO: 1.7 (calc) (ref 1.0–2.5)
ALKALINE PHOSPHATASE (APISO): 51 U/L (ref 33–130)
ALT: 22 U/L (ref 6–29)
AST: 21 U/L (ref 10–35)
Albumin: 4.3 g/dL (ref 3.6–5.1)
BILIRUBIN TOTAL: 0.5 mg/dL (ref 0.2–1.2)
BUN/Creatinine Ratio: 17 (calc) (ref 6–22)
BUN: 18 mg/dL (ref 7–25)
CO2: 28 mmol/L (ref 20–32)
Calcium: 10.3 mg/dL (ref 8.6–10.4)
Chloride: 105 mmol/L (ref 98–110)
Creat: 1.07 mg/dL — ABNORMAL HIGH (ref 0.60–0.88)
GFR, EST AFRICAN AMERICAN: 56 mL/min/{1.73_m2} — AB (ref >=60)
GFR, Est Non African American: 49 mL/min/{1.73_m2} — ABNORMAL LOW (ref >=60)
GLUCOSE: 83 mg/dL (ref 65–99)
Globulin: 2.6 g/dL (calc) (ref 1.9–3.7)
POTASSIUM: 4.1 mmol/L (ref 3.5–5.3)
Sodium: 139 mmol/L (ref 135–146)
TOTAL PROTEIN: 6.9 g/dL (ref 6.1–8.1)

## 2018-07-20 LAB — TSH: TSH: 0.6 m[IU]/L (ref 0.40–4.50)

## 2018-07-20 LAB — VITAMIN B12: Vitamin B-12: 1197 pg/mL — ABNORMAL HIGH (ref 200–1100)

## 2018-07-20 LAB — VITAMIN D 25 HYDROXY (VIT D DEFICIENCY, FRACTURES): VIT D 25 HYDROXY: 28 ng/mL — AB (ref 30–100)

## 2018-07-25 ENCOUNTER — Telehealth: Payer: Self-pay | Admitting: Family Medicine

## 2018-07-25 DIAGNOSIS — N6091 Unspecified benign mammary dysplasia of right breast: Secondary | ICD-10-CM

## 2018-07-25 DIAGNOSIS — Z853 Personal history of malignant neoplasm of breast: Secondary | ICD-10-CM

## 2018-07-25 NOTE — Telephone Encounter (Signed)
I spoke with the patient Explained that she was not referred to [facility] and I apologized that the appointment was scheduled there She is willing to talk with oncologist about medicine and surveillance, but she does not want any surgery Will put in another referral for local oncologist Manager confirmed [facility] is Grandfather

## 2018-07-25 NOTE — Telephone Encounter (Signed)
Copied from [place] [id number]. Topic: Referral - Status >> Jul 24, 2018 11:01 AM Gardiner Ramus wrote: Reason for CRM: Pt called and stated that she would like referral from 07/19/18 canceled. She states that she does not want any more surgeries. >> Jul 25, 2018  7:57 AM Gardiner Ramus wrote: Pt requesting call back. Cb#[phone]   --------------------------------------------------------------------- We talked about this at her appointment, that she did not want more surgery but was willing to talk to someone about medicine  Review of Up-to-Date does not suggest that more surgery is recommended, only to discuss chemo (pills) that might help prevent invasive breast cancer  From Up-to-Date: Patients with both ADH and ALH should be offered active surveillance and options of chemoprevention. (See 'Strategies of reducing breast cancer risk' below.)   Active surveillance-Breast cancer surveillance is performed for all women known to be at an increased risk of breast cancer (eg, positive family history of breast cancer, atypical hyperplasia [AH], lobular carcinoma in situ [LCIS]) as well as those at population risk.  Surveillance must last for the patient's lifetime, or until such time as the patient would not want to undertake treatment for breast cancer if one is found, because the increased risk of breast cancer persists indefinitely. We typically examine patients at six-month intervals and obtain annual diagnostic mammograms. Guidelines from the [employer]  (NCCN) suggest an interval history and physical examination every 6 to 12 months and annual screening mammography [50].  Chemoprevention-We offer endocrine therapy as chemoprevention for women treated for high-risk breast lesions. The purpose is to prevent invasive breast cancer; chemoprevention has not been shown to confer a survival advantage to patients with high-risk lesions. The options for treatment include  selective estrogen receptor modulators and aromatase inhibitors. The choice of agents and duration of therapy are discussed separately. (See "Selective estrogen receptor modulators and aromatase inhibitors for breast cancer prevention".)  ------------------------------------------------------- I called the patient as requested Left voicemail that I was returning her call

## 2018-07-31 DIAGNOSIS — N6091 Unspecified benign mammary dysplasia of right breast: Secondary | ICD-10-CM | POA: Insufficient documentation

## 2018-07-31 NOTE — Assessment & Plan Note (Signed)
Fall precautions discussed. 

## 2018-07-31 NOTE — Assessment & Plan Note (Signed)
Discussion about treatment that may include oral agent; she does not wish to have further surgery but is open to discussin gmedicine for treatment; referral to onc

## 2018-08-07 ENCOUNTER — Inpatient Hospital Stay: Payer: PPO | Admitting: Hematology and Oncology

## 2018-08-18 ENCOUNTER — Encounter: Payer: Self-pay | Admitting: Hematology and Oncology

## 2018-08-18 ENCOUNTER — Inpatient Hospital Stay: Payer: PPO | Attending: Hematology and Oncology | Admitting: Hematology and Oncology

## 2018-08-18 ENCOUNTER — Encounter (INDEPENDENT_AMBULATORY_CARE_PROVIDER_SITE_OTHER): Payer: Self-pay

## 2018-08-18 ENCOUNTER — Inpatient Hospital Stay: Payer: PPO

## 2018-08-18 ENCOUNTER — Other Ambulatory Visit: Payer: Self-pay

## 2018-08-18 VITALS — BP 149/92 | HR 114 | Temp 98.0°F | Resp 18 | Ht 63.0 in | Wt 155.0 lb

## 2018-08-18 DIAGNOSIS — Z79899 Other long term (current) drug therapy: Secondary | ICD-10-CM

## 2018-08-18 DIAGNOSIS — M1990 Unspecified osteoarthritis, unspecified site: Secondary | ICD-10-CM | POA: Diagnosis not present

## 2018-08-18 DIAGNOSIS — Z923 Personal history of irradiation: Secondary | ICD-10-CM | POA: Diagnosis not present

## 2018-08-18 DIAGNOSIS — I10 Essential (primary) hypertension: Secondary | ICD-10-CM

## 2018-08-18 DIAGNOSIS — G20 Parkinson's disease: Secondary | ICD-10-CM | POA: Diagnosis not present

## 2018-08-18 DIAGNOSIS — Z86000 Personal history of in-situ neoplasm of breast: Secondary | ICD-10-CM

## 2018-08-18 DIAGNOSIS — N6091 Unspecified benign mammary dysplasia of right breast: Secondary | ICD-10-CM

## 2018-08-18 DIAGNOSIS — Z170 Estrogen receptor positive status [ER+]: Secondary | ICD-10-CM

## 2018-08-18 LAB — COMPREHENSIVE METABOLIC PANEL
ALBUMIN: 4.3 g/dL (ref 3.5–5.0)
ALT: 27 U/L (ref 0–44)
ANION GAP: 9 (ref 5–15)
AST: 29 U/L (ref 15–41)
Alkaline Phosphatase: 45 U/L (ref 38–126)
BILIRUBIN TOTAL: 0.6 mg/dL (ref 0.3–1.2)
BUN: 19 mg/dL (ref 8–23)
CHLORIDE: 105 mmol/L (ref 98–111)
CO2: 27 mmol/L (ref 22–32)
Calcium: 10 mg/dL (ref 8.9–10.3)
Creatinine, Ser: 1.2 mg/dL — ABNORMAL HIGH (ref 0.44–1.00)
GFR calc Af Amer: 48 mL/min — ABNORMAL LOW (ref >60)
GFR, EST NON AFRICAN AMERICAN: 41 mL/min — AB (ref >60)
Glucose, Bld: 96 mg/dL (ref 70–99)
POTASSIUM: 4.2 mmol/L (ref 3.5–5.1)
SODIUM: 141 mmol/L (ref 135–145)
TOTAL PROTEIN: 8 g/dL (ref 6.5–8.1)

## 2018-08-18 LAB — CBC WITH DIFFERENTIAL/PLATELET
ABS IMMATURE GRANULOCYTES: 0.02 10*3/uL (ref 0.00–0.07)
BASOS ABS: 0.1 10*3/uL (ref 0.0–0.1)
BASOS PCT: 1 %
Eosinophils Absolute: 0.1 10*3/uL (ref 0.0–0.5)
Eosinophils Relative: 1 %
HCT: 45.2 % (ref 36.0–46.0)
Hemoglobin: 15.2 g/dL — ABNORMAL HIGH (ref 12.0–15.0)
IMMATURE GRANULOCYTES: 0 %
LYMPHS PCT: 27 %
Lymphs Abs: 2 10*3/uL (ref 0.7–4.0)
MCH: 32.5 pg (ref 26.0–34.0)
MCHC: 33.6 g/dL (ref 30.0–36.0)
MCV: 96.8 fL (ref 80.0–100.0)
Monocytes Absolute: 0.8 10*3/uL (ref 0.1–1.0)
Monocytes Relative: 11 %
NEUTROS ABS: 4.5 10*3/uL (ref 1.7–7.7)
NEUTROS PCT: 60 %
NRBC: 0 % (ref 0.0–0.2)
PLATELETS: 211 10*3/uL (ref 150–400)
RBC: 4.67 MIL/uL (ref 3.87–5.11)
RDW: 11.8 % (ref 11.5–15.5)
WBC: 7.6 10*3/uL (ref 4.0–10.5)

## 2018-08-18 NOTE — Progress Notes (Signed)
[facility] day:  08/18/2018  Chief Complaint: Kristina Simmons is a 84 y.o. female with atypical lobular hyperplasia of the right breast who is referred in consultation by Dr. Sanda Klein for assessment and management.  HPI:  The patient notes a history of DCIS.  Mammogram in 2010 revealed a new area of microcalcifications.  Stereotactic biopsy suggested atypical hyperplasia. The patient underwent wide excision of the left breast with mastoplasty by Dr. Bary Castilla on 01/15/2009.  Pathology revealed a 0.5 cm area of low grade DCIS adjacent to the biopsy site.  All margins were clear.  DCIS was ER + (>90%) and PR + (50%).  She received radiation.  She completed 5 years of tamoxifen.  She was last seen by Dr. Bary Castilla on 05/01/2014.  She continued regular follow-up mammograms.  Bilateral mammogram on 12/06/2016 revealed no evidence of malignancy.  Bilateral mammogram on 06/21/2018 revealed possible right breast distortion.  Diagnostic right breast mammogram and ultrasound on 06/29/2018 revealed a persistent area of distortion within the slightly upper right breast.  Targeted ultrasound revealed no definite sonographic correlate.  Right axillary lymph nodes were normal.  Right medical breast biopsy on 07/10/2018 revealed features of complex sclerosing lesion with usual ductal hyperplasia and apocrine cysts.  There was focal atypical lobular hyperplasia and no evidence of carcinoma.  Biopsy was complicated by a syncopal event.  Rapid response was called and she was brought to the ER.  She saw Dr. Sanda Klein in follow-up on 07/19/2018.  She adamantly stated that she did not want further breast surgery.  Bone density on 11/02/2012 revealed osteoporosis with a T-score of -2.8 in the left forearm radius and -2.5 in the right femoral neck.  Bone density is scheduled for 08/23/2018. She is not on oral bisphosphonate therapy. She was on supplemental calcium "for while", but no  longer takes it.   Symptomatically, patient has no energy. She states, "I have no stamina". Son notes that the patient has "not been the same" since she started on hormonal therapy in 2010.  She did not improve after discontinuation of hormonal therapy.  Patient rests most of the day. She notes that she is unsteady on her feet. Patient was diagnosed with the "early stages of Parkinson's".  Patient has an altered gait and tremors to her bilateral upper extremities.  Patient has a poor appetite. She does not have the energy to cook. She receives Meals on Wheels for 1 meal a day. She eats cereals and soups, but that is about it.  Patient lives alone. She does not have home health assistance.  [name] lives in [place].  He visits about once a week. Son states, "we are on that fine line of trying to decide whether or not it is time to transition" from independent living to assisted living.   Patient notes excessive tearing. She complains on chronic rhinorrhea. Patient denies that she has experienced any B symptoms. She denies any interval infections. No nausea, vomiting, or changes in her bowel habits. Patient denies bleeding; no hematochezia, melena, or gross hematuria. She is unsure when she last had a colonoscopy.   Menarche was as the age 36. Patient is a G1P1, who had her first child at the age of 78.  She advises that she did not breastfeed her child.   Past Medical History:  Diagnosis Date  . Arthritis   . Breast cancer ([place]) 2010   DCIS; diagnosed on January 15, 2009. Left   .  Breast screening, unspecified   . Bursitis    left hip  . Fall   . Fibromyalgia 2011  . Humerus fracture Mar 01, 2016   left  . Kidney stones   . Personal history of malignant neoplasm of breast 2010   left breast cancer; status post wide excision with mastoplasty followed by whole breast radiation and tamoxifen  . Personal history of radiation therapy 2010   BREAST CA  . Sleep apnea 2006  . Special  screening for malignant neoplasms, colon   . Tremor 2011  . Undiagnosed cardiac murmurs   . Unspecified essential hypertension     Past Surgical History:  Procedure Laterality Date  . ABDOMINAL HYSTERECTOMY  1974  . APPENDECTOMY  1974  . BREAST BIOPSY Right 07/10/2018   RIGHT Affirm Bx ("X" clip) path pending  . BREAST EXCISIONAL BIOPSY Left 2010   breast cancer  . BREAST SURGERY Left 2010   left breast wide excision mastoplasty  . COLONOSCOPY  2004  . LITHOTRIPSY  2008  . status post radiation therapy Left    breast cancer  . UPPER GI ENDOSCOPY  May 28, 2010   Biopsy showed evidence of mild chronic gastritis. There was no evidence of H. Pylori infection. A normal duodenum was reported.     Family History  Problem Relation Age of Onset  . Thyroid disease Mother   . Heart disease Father   . Breast cancer Neg Hx     Social History:  reports that she has never smoked. She has never used smokeless tobacco. She reports that she does not drink alcohol or use drugs.  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 She lives alone in [place].  She receives 1 meal/day from [employer] on Wheels.  Her son lives in [place] and visits her once a week.  The patient is accompanied by her son [name]) today.  Allergies: No Known Allergies  Current Medications: Current Outpatient Medications  Medication Sig Dispense Refill  . Cholecalciferol (VITAMIN D-1000 MAX ST) 1000 units tablet Take 1,000 Units by mouth daily.    . metoprolol succinate (TOPROL-XL) 25 MG 24 hr tablet Take 1 tablet (25 mg total) by mouth daily. 90 tablet 3  . vitamin B-12 (CYANOCOBALAMIN) 1000 MCG tablet Take 1,000 mcg by mouth daily.     No current facility-administered medications for this visit.     Review of Systems:  GENERAL:  No energy.  No stamina.  No fevers, sweats.  Weight loss of 15-20 pounds over a "period of time". PERFORMANCE STATUS (ECOG):  2 HEENT: Poor vision. (+) excessive tearing.  Runny nose.  No sore  throat, mouth sores or tenderness. Lungs: No shortness of breath or cough.  No hemoptysis. Cardiac:  No chest pain, palpitations, orthopnea, or PND. GI:  No appetite.  No nausea, vomiting, diarrhea, constipation, melena or hematochezia.  Colonoscopy "years ago". GU:  No urgency, frequency, dysuria, or hematuria. Musculoskeletal:  No back pain.  No joint pain.  No muscle tenderness. Extremities:  No pain or swelling. Skin:  No rashes or skin changes. Neuro:  Parkinson's. No headache, numbness or weakness, balance or coordination issues. Endocrine:  No diabetes, thyroid issues, hot flashes or night sweats. Psych:  No mood changes, depression or anxiety. Pain:  No focal pain. Review of systems:  All other systems reviewed and found to be negative.  Physical Exam: Blood pressure (!) 149/92, pulse (!) 114, temperature 98 F (36.7 C), temperature source Tympanic, resp. rate 18, height 5\' 3"  (1.6  m), weight 155 lb (70.3 kg). GENERAL:  Elderly woman sitting comfortably in a wheelchair in the exam room in no acute distress.  She needs assistance onto the exam table. MENTAL STATUS:  Alert and oriented to person, place and time. HEAD:  Short gray hair.  Normocephalic, atraumatic, face symmetric, no Cushingoid features. EYES:  Blue eyes.  Squints.  Right lateral conjunctiva injected.  Pupils equal round and reactive to light and accomodation.  No scleral icterus. ENT:  Oropharynx clear without lesion.  Tongue normal. Mucous membranes moist.  RESPIRATORY:  Clear to auscultation without rales, wheezes or rhonchi. CARDIOVASCULAR:  Regular rate and rhythm without murmur, rub or gallop. BREAST:  Right breast s/p biopsy above the nipple.  No masses, skin changes or nipple discharge.  Left breast with post-operative change laterally.  No masses, skin changes or nipple discharge.  ABDOMEN:  Soft, non-tender, with active bowel sounds, and no hepatosplenomegaly.  No masses. SKIN:  No rashes, ulcers or  lesions. EXTREMITIES: No edema, no skin discoloration or tenderness.  No palpable cords. LYMPH NODES: No palpable cervical, supraclavicular, axillary or inguinal adenopathy  NEUROLOGICAL: Unremarkable. PSYCH:  Appropriate.   No visits with results within 3 Day(s) from this visit.  Latest known visit with results is:  Office Visit on 07/19/2018  Component Date Value Ref Range Status  . Vit D, 25-Hydroxy 07/19/2018 28* 30 - 100 ng/mL Final   Comment: Vitamin D Status         25-OH Vitamin D: . Deficiency:                    <20 ng/mL Insufficiency:             20 - 29 ng/mL Optimal:                 > or = 30 ng/mL . For 25-OH Vitamin D testing on patients on  D2-supplementation and patients for whom quantitation  of D2 and D3 fractions is required, the QuestAssureD(TM) 25-OH VIT D, (D2,D3), LC/MS/MS is recommended: order  code [id number] (patients >30yrs). . For more information on this test, go to: http://education.questdiagnostics.com/faq/FAQ163 (This link is being provided for  informational/educational purposes only.)   . Vitamin B-12 07/19/2018 1,197* 200 - 1,100 pg/mL Final  . Glucose, Bld 07/19/2018 83  65 - 99 mg/dL Final   Comment: .            Fasting reference interval .   . BUN 07/19/2018 18  7 - 25 mg/dL Final  . Creat 07/19/2018 1.07* 0.60 - 0.88 mg/dL Final   Comment: For patients >72 years of age, the reference limit for Creatinine is approximately 13% higher for people identified as African-American. .   . GFR, Est Non African American 07/19/2018 49* > OR = 60 mL/min/1.85m2 Final  . GFR, Est African American 07/19/2018 56* > OR = 60 mL/min/1.59m2 Final  . BUN/Creatinine Ratio 07/19/2018 17  6 - 22 (calc) Final  . Sodium 07/19/2018 139  135 - 146 mmol/L Final  . Potassium 07/19/2018 4.1  3.5 - 5.3 mmol/L Final  . Chloride 07/19/2018 105  98 - 110 mmol/L Final  . CO2 07/19/2018 28  20 - 32 mmol/L Final  . Calcium 07/19/2018 10.3  8.6 - 10.4 mg/dL Final  .  Total Protein 07/19/2018 6.9  6.1 - 8.1 g/dL Final  . Albumin 07/19/2018 4.3  3.6 - 5.1 g/dL Final  . Globulin 07/19/2018 2.6  1.9 - 3.7 g/dL (calc) Final  .  AG Ratio 07/19/2018 1.7  1.0 - 2.5 (calc) Final  . Total Bilirubin 07/19/2018 0.5  0.2 - 1.2 mg/dL Final  . Alkaline phosphatase (APISO) 07/19/2018 51  33 - 130 U/L Final  . AST 07/19/2018 21  10 - 35 U/L Final  . ALT 07/19/2018 22  6 - 29 U/L Final  . TSH 07/19/2018 0.60  0.40 - 4.50 mIU/L Final    Assessment:  Kristina Simmons is a 84 y.o. female with atypical lobular hyperplasia s/p biopsy on 07/10/2018.  Pathology revealed features of complex sclerosing lesion with usual ductal hyperplasia and apocrine cysts.  There was focal atypical lobular hyperplasia and no evidence of carcinoma.  The patient has a history of DCIS s/p wide excision of the left breast with mastoplasty on 01/15/2009.  Pathology revealed a 0.5 cm area of low grade DCIS adjacent to the biopsy site.  All margins were clear.  DCIS was ER + (>90%) and PR + (50%).  She received radiation.  She completed 5 years of tamoxifen.  Bilateral mammogram on 06/21/2018 revealed possible right breast distortion.  Diagnostic right breast mammogram and ultrasound on 06/29/2018 revealed a persistent area of distortion within the slightly upper right breast.  Targeted ultrasound revealed no definite sonographic correlate.  Right axillary lymph nodes were normal.  Bone density on 11/02/2012 revealed osteoporosis with a T-score of -2.8 in the left forearm radius and -2.5 in the right femoral neck.  Bone density is scheduled for 08/23/2018.  Symptomatically, she is fatigued.  She has early Parkinson's.  She lives independently.  Exam reveals right breast biopsy changes and left breast post-operative and post radiation changes.  Plan: 1.   Labs today: CBC with diff, CMP  2.   Atypical lobular hyperplasia:  Discuss increase risk of breast cancer in patients with atypical lobular  hyperplasia [facility]).  Discuss patient's thoughts about resection.  Biopsy may underestimate diagnosis as it did with the left sided lesion in 2010.  Patient refuses surgical intervention.   Discuss risk reduction strategies (tamoxifen, raloxifene or an aromatase inhibitor).  Potential side effects reviewed in detail.  Discuss concern for bone thinning with an aromatase inhibitor given patient's known osteoporosis and risk for fracture.  Discuss Baker Janus model and risk of breast cancer.  Discuss how past history of DCIS is not accounted for in current model.  Baker Janus model: 5 year risk 4.2% (average 2%). Lifetime risk 6% (average 2.8%).  After an extended discussion, patient and her son would like to pursue tamoxifen as she is familiar with side effects and tolerated well in the past. 3.   Osteoporosis:  Discuss risk of fracture.  Discuss calcium and vitamin D.  Follow-up bone density scheduled for 08/23/2018. 4.   Rx: tamoxifen after labs return. 5.   RTC in 1 month for MD assessment and labs (CMP).   Honor Loh, NP  08/18/2018, 4:03 PM   I saw and evaluated the patient, participating in the key portions of the service and reviewing pertinent diagnostic studies and records.  I reviewed the nurse practitioner's note and agree with the findings and the plan.  The assessment and plan were discussed with the patient.  Multiple questions were asked by the patient and her son and answered.   Nolon Stalls, MD 08/18/2018,4:03 PM

## 2018-08-18 NOTE — Progress Notes (Signed)
Patient here today as new evaluation regarding atypical lobular hyperplasia of right breast.  Patient has history of left breast cancer.  She got radiation treatment only in 2019.  Patient did take Tamoxifen for about 4 1/2 years.  Patient is accompanied by her son, [name].

## 2018-08-21 ENCOUNTER — Telehealth: Payer: Self-pay | Admitting: *Deleted

## 2018-08-21 NOTE — Telephone Encounter (Signed)
Patient called with questions about her AVS from last week's visit. Writer advised of her future appointment. Patient verbalized understanding.

## 2018-08-23 ENCOUNTER — Ambulatory Visit
Admission: RE | Admit: 2018-08-23 | Discharge: 2018-08-23 | Disposition: A | Discharge status: Home / Self Care | Payer: PPO | Source: Ambulatory Visit | Attending: Family Medicine | Admitting: Family Medicine

## 2018-08-23 ENCOUNTER — Encounter: Payer: Self-pay | Admitting: Family Medicine

## 2018-08-23 ENCOUNTER — Other Ambulatory Visit: Payer: Self-pay | Admitting: Family Medicine

## 2018-08-23 DIAGNOSIS — M810 Age-related osteoporosis without current pathological fracture: Secondary | ICD-10-CM

## 2018-08-23 DIAGNOSIS — M8588 Other specified disorders of bone density and structure, other site: Secondary | ICD-10-CM | POA: Insufficient documentation

## 2018-08-23 HISTORY — DX: Age-related osteoporosis without current pathological fracture: M81.0

## 2018-08-23 NOTE — Progress Notes (Signed)
Refer to endo for osteoporosis

## 2018-08-24 ENCOUNTER — Telehealth: Payer: Self-pay

## 2018-08-24 DIAGNOSIS — M810 Age-related osteoporosis without current pathological fracture: Secondary | ICD-10-CM

## 2018-08-24 NOTE — Telephone Encounter (Signed)
-----   Message from Arnetha Courser, MD sent at 08/23/2018  4:18 PM EST ----- Please let the patient know: Your bone density shows that you have osteoporosis.  Your risk of a fracture is elevated, so we want to do everything we can to reduce the chance of a hip fracture or vertebral fracture.  I'll encourage you to get three servings of calcium a day as well as 1,000 iu of vitamin D3 daily. Practice fall precautions (don't stand on chairs or get up on ladders or step stools to reach things in high places, always use railings if going up and down stairs, don't risk going out on icy days during the winter, have grab bars installed in your bathroom/shower, etc.). Because she is at risk for a fracture, we want her to see an endocrinologist to discuss options for treatment. Treatment may help reduce the risk of fracture and help strengthen her bones. Please REFER to endo for osteoporosis, T-score -3.

## 2018-09-06 ENCOUNTER — Telehealth: Payer: Self-pay | Admitting: *Deleted

## 2018-09-06 NOTE — Telephone Encounter (Signed)
Patient called to cancel her 09/22/18 lab/MD appt. Pt. stated that she would call back at a later date to R/S

## 2018-09-22 ENCOUNTER — Other Ambulatory Visit: Payer: Self-pay

## 2018-09-22 ENCOUNTER — Ambulatory Visit: Payer: Self-pay | Admitting: Hematology and Oncology

## 2018-09-25 ENCOUNTER — Telehealth: Payer: Self-pay | Admitting: Family Medicine

## 2018-09-25 NOTE — Telephone Encounter (Signed)
Left message on voicemail about patient seeing the [facility] on 10-19-2018 which is the same day the patient is scheduled to see her provider. Patient is scheduled to see the [facility] at 10:00 and then follw-up with Dr.Lada at 11:00. Requested that the patient please call the office if she is unable to come in at 10:00.

## 2018-10-19 ENCOUNTER — Ambulatory Visit: Payer: Self-pay

## 2018-10-19 ENCOUNTER — Ambulatory Visit: Payer: Self-pay | Admitting: Family Medicine

## 2018-11-22 ENCOUNTER — Telehealth: Payer: Self-pay | Admitting: Family Medicine

## 2018-11-22 NOTE — Telephone Encounter (Signed)
I left a message asking the patient to call and reschedule Medicare AWV-I with Nurse Health Advisor, [place] and office visit with Dr. Sanda Klein (cancelled 10/19/2018).  If the patient calls back, please reschedule Medicare Wellness Visit with Nurse Health Advisor and office visit with Dr. Sanda Klein. VDM (DD)

## 2018-11-30 ENCOUNTER — Ambulatory Visit: Payer: Self-pay | Admitting: Family Medicine

## 2018-11-30 ENCOUNTER — Ambulatory Visit (INDEPENDENT_AMBULATORY_CARE_PROVIDER_SITE_OTHER): Payer: PPO

## 2018-11-30 VITALS — BP 130/86 | HR 80 | Temp 97.5°F | Resp 15 | Ht 63.0 in | Wt 159.9 lb

## 2018-11-30 DIAGNOSIS — Z23 Encounter for immunization: Secondary | ICD-10-CM | POA: Diagnosis not present

## 2018-11-30 DIAGNOSIS — Z0000 Encounter for general adult medical examination without abnormal findings: Secondary | ICD-10-CM

## 2018-11-30 NOTE — Progress Notes (Signed)
Subjective:   Kristina Simmons is a 85 y.o. female who presents for an Initial Medicare Annual Wellness Visit.  Review of Systems    Cardiac Risk Factors include: advanced age (>41men, >71 women);hypertension     Objective:    Today's Vitals   11/30/18 1444  BP: 130/86  Pulse: 80  Resp: 15  Temp: (!) 97.5 F (36.4 C)  TempSrc: Oral  SpO2: 98%  Weight: 159 lb 14.4 oz (72.5 kg)  Height: 5\' 3"  (1.6 m)   Body mass index is 28.33 kg/m.  Advanced Directives 11/30/2018 08/18/2018 07/10/2018 07/22/2016 03/12/2016 03/02/2016 06/17/2015  Does Patient Have a Medical Advance Directive? Yes No No No No No No  Does patient want to make changes to medical advance directive? Yes (MAU/Ambulatory/Procedural Areas - Information given) - - - - - -  Would patient like information on creating a medical advance directive? - - - No - patient declined information No - patient declined information - No - patient declined information    Current Medications (verified) Outpatient Encounter Medications as of 11/30/2018  Medication Sig  . calcium carbonate (OS-CAL - DOSED IN MG OF ELEMENTAL CALCIUM) 1250 (500 Ca) MG tablet Take 1 tablet by mouth.  . Cholecalciferol (VITAMIN D-1000 MAX ST) 1000 units tablet Take 1,000 Units by mouth daily.  . metoprolol succinate (TOPROL-XL) 25 MG 24 hr tablet Take 1 tablet (25 mg total) by mouth daily.  . vitamin B-12 (CYANOCOBALAMIN) 1000 MCG tablet Take 1,000 mcg by mouth daily.   No facility-administered encounter medications on file as of 11/30/2018.     Allergies (verified) Patient has no known allergies.   History: Past Medical History:  Diagnosis Date  . Arthritis   . Breast cancer ([place]) 2010   DCIS; diagnosed on January 15, 2009. Left   . Breast screening, unspecified   . Bursitis    left hip  . Fall   . Fibromyalgia 2011  . Humerus fracture Mar 01, 2016   left  . Kidney stones   . Osteoporosis 08/23/2018  . Personal history of malignant neoplasm of  breast 2010   left breast cancer; status post wide excision with mastoplasty followed by whole breast radiation and tamoxifen  . Personal history of radiation therapy 2010   BREAST CA  . Sleep apnea 2006  . Special screening for malignant neoplasms, colon   . Tremor 2011  . Undiagnosed cardiac murmurs   . Unspecified essential hypertension    Past Surgical History:  Procedure Laterality Date  . ABDOMINAL HYSTERECTOMY  1974  . APPENDECTOMY  1974  . BREAST BIOPSY Right 07/10/2018   RIGHT Affirm Bx ("X" clip) path pending  . BREAST EXCISIONAL BIOPSY Left 2010   breast cancer  . BREAST SURGERY Left 2010   left breast wide excision mastoplasty  . COLONOSCOPY  2004  . LITHOTRIPSY  2008  . status post radiation therapy Left    breast cancer  . UPPER GI ENDOSCOPY  May 28, 2010   Biopsy showed evidence of mild chronic gastritis. There was no evidence of H. Pylori infection. A normal duodenum was reported.    Family History  Problem Relation Age of Onset  . Thyroid disease Mother   . Heart disease Father   . Breast cancer Neg Hx    Social History   Socioeconomic History  . Marital status: Widowed    Spouse name: Not on file  . Number of children: 1  . Years of education: Not on file  .  Highest education level: Not on file  Occupational History  . Not on file  Social Needs  . Financial resource strain: Not very hard  . Food insecurity:    Worry: Never true    Inability: Never true  . Transportation needs:    Medical: No    Non-medical: No  Tobacco Use  . Smoking status: Never Smoker  . Smokeless tobacco: Never Used  Substance and Sexual Activity  . Alcohol use: No  . Drug use: No  . Sexual activity: Not Currently  Lifestyle  . Physical activity:    Days per week: 0 days    Minutes per session: 0 min  . Stress: Only a little  Relationships  . Social connections:    Talks on phone: More than three times a week    Gets together: Once a week    Attends religious  service: Never    Active member of club or organization: No    Attends meetings of clubs or organizations: Never    Relationship status: Widowed  Other Topics Concern  . Not on file  Social History Narrative  . Not on file    Tobacco Counseling Counseling given: Not Answered   Clinical Intake:  Pre-visit preparation completed: Yes  Pain : No/denies pain     BMI - recorded: 28.33 Nutritional Status: BMI 25 -29 Overweight Nutritional Risks: None Diabetes: No  How often do you need to have someone help you when you read instructions, pamphlets, or other written materials from your doctor or pharmacy?: 1 - Never  Interpreter Needed?: No  Information entered by :: Clemetine Marker LPN   Activities of Daily Living In your present state of health, do you have any difficulty performing the following activities: 11/30/2018 07/19/2018  Hearing? N N  Comment declines hearing aids -  Vision? Tempie Donning  Comment has glasses -  Difficulty concentrating or making decisions? N Y  Walking or climbing stairs? Y Y  Dressing or bathing? Y Y  Comment sponge baths only -  Doing errands, shopping? Y Y  Comment does not drive Facilities manager and eating ? N -  Using the Toilet? N -  In the past six months, have you accidently leaked urine? N -  Do you have problems with loss of bowel control? N -  Managing your Medications? N -  Managing your Finances? N -  Housekeeping or managing your Housekeeping? N -  Some recent data might be hidden     Immunizations and Health Maintenance Immunization History  Administered Date(s) Administered  . Influenza, High Dose Seasonal PF 06/17/2015, 07/22/2016, 09/01/2017, 07/19/2018  . Pneumococcal Conjugate-13 10/13/2017  . Pneumococcal Polysaccharide-23 11/30/2018   Health Maintenance Due  Topic Date Due  . PNA vac Low Risk Adult (2 of 2 - PPSV23) 10/13/2018    Patient Care Team: Lada, Satira Anis, MD as PCP - General (Family Medicine) Bary Castilla,  Forest Gleason, MD (General Surgery) Ashok Norris, MD (Family Medicine)  Indicate any recent Medical Services you may have received from other than [facility] providers in the past year (date may be approximate).     Assessment:   This is a routine wellness examination for Kristina Simmons.  Hearing/Vision screen Hearing Screening Comments: Pt has no difficulty hearing Vision Screening Comments: Annual vision screenings done by [facility]  Dietary issues and exercise activities discussed: Current Exercise Habits: The patient does not participate in regular exercise at present, Exercise limited by: orthopedic condition(s)  Goals  None    Depression Screen PHQ 2/9 Scores 11/30/2018 07/19/2018 07/19/2018 10/13/2017 09/01/2017 07/22/2016 03/12/2016  PHQ - 2 Score 0 0 0 2 4 1 1   PHQ- 9 Score - 15 - 6 8 - -    Fall Risk Fall Risk  11/30/2018 07/19/2018 10/13/2017 09/01/2017 07/22/2016  Falls in the past year? 0 Yes No Yes Yes  Number falls in past yr: 0 2 or more - 1 2 or more  Injury with Fall? 0 Yes - - Yes  Follow up Falls prevention discussed - - - -    FALL RISK PREVENTION PERTAINING TO THE HOME:  Any stairs in or around the home? Yes  If so, are they are without handrails? Yes   Home free of loose throw rugs in walkways, pet beds, electrical cords, etc? Yes  Adequate lighting in your home to reduce risk of falls? Yes   ASSISTIVE DEVICES UTILIZED TO PREVENT FALLS:  Life alert? No  Use of a cane, walker or w/c? Yes  Grab bars in the bathroom? No  Shower chair or bench in shower? No  Elevated toilet seat or a handicapped toilet? No   DME ORDERS:  DME order needed?  No   TIMED UP AND GO:  Was the test performed? Yes .  Length of time to ambulate 10 feet: 12 sec.   GAIT:  Appearance of gait: Gait slow, steady and with the use of an assistive device. Pt held onto her son while walking.   Education: Fall risk prevention has been discussed.  Intervention(s) required? Yes  -  needs grab bars in the bathroom for safety  Cognitive Function:     6CIT Screen 11/30/2018  What Year? 0 points  What month? 0 points  What time? 0 points  Count back from 20 0 points  Months in reverse 0 points  Repeat phrase 0 points  Total Score 0    Screening Tests Health Maintenance  Topic Date Due  . PNA vac Low Risk Adult (2 of 2 - PPSV23) 10/13/2018  . TETANUS/TDAP  12/01/2019 (Originally 09/06/1953)  . INFLUENZA VACCINE  Completed  . DEXA SCAN  Completed    Qualifies for Shingles Vaccine? Yes . Due for Shingrix. Education has been provided regarding the importance of this vaccine. Pt has been advised to call insurance company to determine out of pocket expense. Advised may also receive vaccine at local pharmacy or Health Dept. Verbalized acceptance and understanding.  Tdap: Although this vaccine is not a covered service during a Wellness Exam, does the patient still wish to receive this vaccine today?  No .  Education has been provided regarding the importance of this vaccine. Advised may receive this vaccine at local pharmacy or Health Dept. Aware to provide a copy of the vaccination record if obtained from local pharmacy or Health Dept. Verbalized acceptance and understanding.  Flu Vaccine: Up to date  Pneumococcal Vaccine: Due for Pneumococcal vaccine. Does the patient want to receive this vaccine today?  Yes . Education has been provided regarding the importance of this vaccine but still declined. Advised may receive this vaccine at local pharmacy or Health Dept. Aware to provide a copy of the vaccination record if obtained from local pharmacy or Health Dept. Verbalized acceptance and understanding.   Cancer Screenings:  Colorectal Screening:  No longer required.   Mammogram: Completed 07/10/18. Repeat every year.  Bone Density: Completed 08/23/18. Results reflect OSTEOPOROSIS. Repeat every 2 years.   Lung Cancer Screening: (Low Dose CT  Chest recommended if Age  53-80 years, 30 pack-year currently smoking OR have quit w/in 15years.) does not qualify.    Additional Screening:  Hepatitis C Screening: no longer required  Vision Screening: Recommended annual ophthalmology exams for early detection of glaucoma and other disorders of the eye. Is the patient up to date with their annual eye exam?  No Who is the provider or what is the name of the office in which the pt attends annual eye exams? [place] Screening: Recommended annual dental exams for proper oral hygiene  Community Resource Referral:  CRR required this visit?  No      Plan:    I have personally reviewed and addressed the Medicare Annual Wellness questionnaire and have noted the following in the patient's chart:  A. Medical and social history B. Use of alcohol, tobacco or illicit drugs  C. Current medications and supplements D. Functional ability and status E.  Nutritional status F.  Physical activity G. Advance directives H. List of other physicians I.  Hospitalizations, surgeries, and ER visits in previous 12 months J.  [place] such as hearing and vision if needed, cognitive and depression L. Referrals and appointments   In addition, I have reviewed and discussed with patient certain preventive protocols, quality metrics, and best practice recommendations. A written personalized care plan for preventive services as well as general preventive health recommendations were provided to patient.   Signed,  Clemetine Marker, LPN Nurse Health Advisor   Nurse Notes: Pt presented to [facility] today with her son [name]. One hour spent with them due to concern for pt living alone, homebound and not as steady as she used to be. Pt was seen by neurology in Jan 2019 and prescribed sinemet but she is not taking and unsure if she ever took it. She was supposed to follow up but did not. Her husband passed away last 2023/01/26 and her son is her only caregiver, he lives in  [place] and manages a funeral home and is very limited on time. Pt was referred to endocrinology for osteoporosis management in Nov 2019 but declined an appt due to the  holidays being a busy time. Provided phone # for [facility] endocrinology, neurology and [facility] for exam due to c/o poor vision and depth perception. Pt does receive meals on wheels but only does sponge baths due to bathrooms having tub showers and no grab bars for safety. [name] is interested in home health for physical therapy to possibly help improve gait and balance and home health aide to assist in bathing. Pt also advised to r/s appt with Dr. Sanda Klein for follow up.

## 2018-11-30 NOTE — Patient Instructions (Addendum)
Kristina Simmons , Thank you for taking time to come for your Medicare Wellness Visit. I appreciate your ongoing commitment to your health goals. Please review the following plan we discussed and let me know if I can assist you in the future.   Screening recommendations/referrals: Colonoscopy: no longer required Mammogram: done 07/10/18. Repeat every year.  Bone Density: done 08/23/18. Repeat in 2021. Recommended yearly ophthalmology/optometry visit for glaucoma screening and checkup Recommended yearly dental visit for hygiene and checkup  Vaccinations: Influenza vaccine: done 07/19/18 Pneumococcal vaccine: done today Tdap vaccine: due - please contact us if you get a cut or scrape Shingles vaccine: Shingrix discussed. Please contact your pharmacy for coverage information.     Advanced directives: Advance directive discussed with you today. I have provided a copy for you to complete at home and have notarized. Once this is complete please bring a copy in to our office so we can scan it into your chart.  Conditions/risks identified: Recommend fall prevention by installing grab bars in the bathroom.   Please call [facility] to follow up on osteoporosis at [phone]  Please schedule eye exam at [facility] at [phone]  Please contact [facility] Chipper Herb NP) at [phone]  Next appointment: Please follow up in one year for your Medicare Annual Wellness visit.     Preventive Care 63 Years and Older, Female Preventive care refers to lifestyle choices and visits with your health care provider that can promote health and wellness. What does preventive care include?  A yearly physical exam. This is also called an annual well check.  Dental exams once or twice a year.  Routine eye exams. Ask your health care provider how often you should have your eyes checked.  Personal lifestyle choices, including:  Daily care of your teeth and  gums.  Regular physical activity.  Eating a healthy diet.  Avoiding tobacco and drug use.  Limiting alcohol use.  Practicing safe sex.  Taking low-dose aspirin every day.  Taking vitamin and mineral supplements as recommended by your health care provider. What happens during an annual well check? The services and screenings done by your health care provider during your annual well check will depend on your age, overall health, lifestyle risk factors, and family history of disease. Counseling  Your health care provider may ask you questions about your:  Alcohol use.  Tobacco use.  Drug use.  Emotional well-being.  Home and relationship well-being.  Sexual activity.  Eating habits.  History of falls.  Memory and ability to understand (cognition).  Work and work Statistician.  Reproductive health. Screening  You may have the following tests or measurements:  Height, weight, and BMI.  Blood pressure.  Lipid and cholesterol levels. These may be checked every 5 years, or more frequently if you are over 16 years old.  Skin check.  Lung cancer screening. You may have this screening every year starting at age 94 if you have a 30-pack-year history of smoking and currently smoke or have quit within the past 15 years.  Fecal occult blood test (FOBT) of the stool. You may have this test every year starting at age 8.  Flexible sigmoidoscopy or colonoscopy. You may have a sigmoidoscopy every 5 years or a colonoscopy every 10 years starting at age 27.  Hepatitis C blood test.  Hepatitis B blood test.  Sexually transmitted disease (STD) testing.  Diabetes screening. This is done by checking your blood sugar (glucose) after you have not eaten  for a while (fasting). You may have this done every 1-3 years.  Bone density scan. This is done to screen for osteoporosis. You may have this done starting at age 76.  Mammogram. This may be done every 1-2 years. Talk to your  health care provider about how often you should have regular mammograms. Talk with your health care provider about your test results, treatment options, and if necessary, the need for more tests. Vaccines  Your health care provider may recommend certain vaccines, such as:  Influenza vaccine. This is recommended every year.  Tetanus, diphtheria, and acellular pertussis (Tdap, Td) vaccine. You may need a Td booster every 10 years.  Zoster vaccine. You may need this after age 81.  Pneumococcal 13-valent conjugate (PCV13) vaccine. One dose is recommended after age 3.  Pneumococcal polysaccharide (PPSV23) vaccine. One dose is recommended after age 59. Talk to your health care provider about which screenings and vaccines you need and how often you need them. This information is not intended to replace advice given to you by your health care provider. Make sure you discuss any questions you have with your health care provider. Document Released: 10/31/2015 Document Revised: 06/23/2016 Document Reviewed: 08/05/2015 Elsevier Interactive Patient Education  2017 [place] Prevention in the Home Falls can cause injuries. They can happen to people of all ages. There are many things you can do to make your home safe and to help prevent falls. What can I do on the outside of my home?  Regularly fix the edges of walkways and driveways and fix any cracks.  Remove anything that might make you trip as you walk through a door, such as a raised step or threshold.  Trim any bushes or trees on the path to your home.  Use bright outdoor lighting.  Clear any walking paths of anything that might make someone trip, such as rocks or tools.  Regularly check to see if handrails are loose or broken. Make sure that both sides of any steps have handrails.  Any raised decks and porches should have guardrails on the edges.  Have any leaves, snow, or ice cleared regularly.  Use sand or salt on walking  paths during winter.  Clean up any spills in your garage right away. This includes oil or grease spills. What can I do in the bathroom?  Use night lights.  Install grab bars by the toilet and in the tub and shower. Do not use towel bars as grab bars.  Use non-skid mats or decals in the tub or shower.  If you need to sit down in the shower, use a plastic, non-slip stool.  Keep the floor dry. Clean up any water that spills on the floor as soon as it happens.  Remove soap buildup in the tub or shower regularly.  Attach bath mats securely with double-sided non-slip rug tape.  Do not have throw rugs and other things on the floor that can make you trip. What can I do in the bedroom?  Use night lights.  Make sure that you have a light by your bed that is easy to reach.  Do not use any sheets or blankets that are too big for your bed. They should not hang down onto the floor.  Have a firm chair that has side arms. You can use this for support while you get dressed.  Do not have throw rugs and other things on the floor that can make you trip. What can I do  in the kitchen?  Clean up any spills right away.  Avoid walking on wet floors.  Keep items that you use a lot in easy-to-reach places.  If you need to reach something above you, use a strong step stool that has a grab bar.  Keep electrical cords out of the way.  Do not use floor polish or wax that makes floors slippery. If you must use wax, use non-skid floor wax.  Do not have throw rugs and other things on the floor that can make you trip. What can I do with my stairs?  Do not leave any items on the stairs.  Make sure that there are handrails on both sides of the stairs and use them. Fix handrails that are broken or loose. Make sure that handrails are as long as the stairways.  Check any carpeting to make sure that it is firmly attached to the stairs. Fix any carpet that is loose or worn.  Avoid having throw rugs at  the top or bottom of the stairs. If you do have throw rugs, attach them to the floor with carpet tape.  Make sure that you have a light switch at the top of the stairs and the bottom of the stairs. If you do not have them, ask someone to add them for you. What else can I do to help prevent falls?  Wear shoes that:  Do not have high heels.  Have rubber bottoms.  Are comfortable and fit you well.  Are closed at the toe. Do not wear sandals.  If you use a stepladder:  Make sure that it is fully opened. Do not climb a closed stepladder.  Make sure that both sides of the stepladder are locked into place.  Ask someone to hold it for you, if possible.  Clearly mark and make sure that you can see:  Any grab bars or handrails.  First and last steps.  Where the edge of each step is.  Use tools that help you move around (mobility aids) if they are needed. These include:  Canes.  Walkers.  Scooters.  Crutches.  Turn on the lights when you go into a dark area. Replace any light bulbs as soon as they burn out.  Set up your furniture so you have a clear path. Avoid moving your furniture around.  If any of your floors are uneven, fix them.  If there are any pets around you, be aware of where they are.  Review your medicines with your doctor. Some medicines can make you feel dizzy. This can increase your chance of falling. Ask your doctor what other things that you can do to help prevent falls. This information is not intended to replace advice given to you by your health care provider. Make sure you discuss any questions you have with your health care provider. Document Released: 07/31/2009 Document Revised: 03/11/2016 Document Reviewed: 11/08/2014 Elsevier Interactive Patient Education  2017 [place].  Pneumococcal Polysaccharide Vaccine: What You Need to Know 1. Why get vaccinated? Vaccination can protect older adults (and some children and younger adults) from  pneumococcal disease. Pneumococcal disease is caused by bacteria that can spread from person to person through close contact. It can cause ear infections, and it can also lead to more serious infections of the:  Lungs (pneumonia),  Blood (bacteremia), and  Covering of the brain and spinal cord (meningitis). Meningitis can cause deafness and brain damage, and it can be fatal. Anyone can get pneumococcal disease, but children  under 31 years of age, people with certain medical conditions, adults over 29 years of age, and cigarette smokers are at the highest risk. About 18,000 older adults die each year from pneumococcal disease in the [place]. Treatment of pneumococcal infections with penicillin and other drugs used to be more effective. But some strains of the disease have become resistant to these drugs. This makes prevention of the disease, through vaccination, even more important. 2. Pneumococcal polysaccharide vaccine (PPSV23) Pneumococcal polysaccharide vaccine (PPSV23) protects against 23 types of pneumococcal bacteria. It will not prevent all pneumococcal disease. PPSV23 is recommended for:  All adults 65 years of age and older,  Anyone 2 through 83 years of age with certain long-term health problems,  Anyone 2 through 83 years of age with a weakened immune system,  Adults 61 through 83 years of age who smoke cigarettes or have asthma. Most people need only one dose of PPSV. A second dose is recommended for certain high-risk groups. People 51 and older should get a dose even if they have gotten one or more doses of the vaccine before they turned 65. Your healthcare provider can give you more information about these recommendations. Most healthy adults develop protection within 2 to 3 weeks of getting the shot. 3. Some people should not get this vaccine  Anyone who has had a life-threatening allergic reaction to PPSV should not get another dose.  Anyone who has a severe allergy  to any component of PPSV should not receive it. Tell your provider if you have any severe allergies.  Anyone who is moderately or severely ill when the shot is scheduled may be asked to wait until they recover before getting the vaccine. Someone with a mild illness can usually be vaccinated.  Children less than 46 years of age should not receive this vaccine.  There is no evidence that PPSV is harmful to either a pregnant woman or to her fetus. However, as a precaution, women who need the vaccine should be vaccinated before becoming pregnant, if possible. 4. Risks of a vaccine reaction With any medicine, including vaccines, there is a chance of side effects. These are usually mild and go away on their own, but serious reactions are also possible. About half of people who get PPSV have mild side effects, such as redness or pain where the shot is given, which go away within about two days. Less than 1 out of 100 people develop a fever, muscle aches, or more severe local reactions. Problems that could happen after any vaccine:  People sometimes faint after a medical procedure, including vaccination. Sitting or lying down for about 15 minutes can help prevent fainting, and injuries caused by a fall. Tell your doctor if you feel dizzy, or have vision changes or ringing in the ears.  Some people get severe pain in the shoulder and have difficulty moving the arm where a shot was given. This happens very rarely.  Any medication can cause a severe allergic reaction. Such reactions from a vaccine are very rare, estimated at about 1 in a million doses, and would happen within a few minutes to a few hours after the vaccination. As with any medicine, there is a very remote chance of a vaccine causing a serious injury or death. The safety of vaccines is always being monitored. For more information, visit: http://www.aguilar.org/ 5. What if there is a serious reaction? What should I look for? Look for  anything that concerns you, such as signs of a severe  allergic reaction, very high fever, or unusual behavior. Signs of a severe allergic reaction can include hives, swelling of the face and throat, difficulty breathing, a fast heartbeat, dizziness, and weakness. These would usually start a few minutes to a few hours after the vaccination. What should I do? If you think it is a severe allergic reaction or other emergency that can't wait, call 9-1-1 or get to the nearest hospital. Otherwise, call your doctor. Afterward, the reaction should be reported to the Vaccine Adverse Event Reporting System (VAERS). Your doctor might file this report, or you can do it yourself through the VAERS web site at www.vaers.SamedayNews.es, or by calling (351)373-4822. VAERS does not give medical advice. 6. How can I learn more?  Ask your doctor. He or she can give you the vaccine package insert or suggest other sources of information.  Call your local or state health department.  Contact the Centers for Disease Control and Prevention (CDC): ? Call 205 613 1931 (1-800-CDC-INFO) or ? Visit CDC's website at http://hunter.com/ CDC Vaccine Information Statement PPSV Vaccine (02/08/2014) This information is not intended to replace advice given to you by your health care provider. Make sure you discuss any questions you have with your health care provider. Document Released: 08/01/2006 Document Revised: 05/16/2018 Document Reviewed: 05/16/2018 Elsevier Interactive Patient Education  2019 [employer].

## 2019-01-08 ENCOUNTER — Other Ambulatory Visit: Payer: Self-pay

## 2019-01-08 ENCOUNTER — Emergency Department: Payer: PPO

## 2019-01-08 ENCOUNTER — Emergency Department
Admission: EM | Admit: 2019-01-08 | Discharge: 2019-01-09 | Disposition: A | Discharge status: Home / Self Care | Payer: PPO | Attending: Emergency Medicine | Admitting: Emergency Medicine

## 2019-01-08 DIAGNOSIS — M549 Dorsalgia, unspecified: Secondary | ICD-10-CM | POA: Diagnosis not present

## 2019-01-08 DIAGNOSIS — S3992XA Unspecified injury of lower back, initial encounter: Secondary | ICD-10-CM | POA: Diagnosis not present

## 2019-01-08 DIAGNOSIS — R531 Weakness: Secondary | ICD-10-CM | POA: Diagnosis not present

## 2019-01-08 DIAGNOSIS — Y92018 Other place in single-family (private) house as the place of occurrence of the external cause: Secondary | ICD-10-CM | POA: Diagnosis not present

## 2019-01-08 DIAGNOSIS — M545 Low back pain, unspecified: Secondary | ICD-10-CM

## 2019-01-08 DIAGNOSIS — S0990XA Unspecified injury of head, initial encounter: Secondary | ICD-10-CM | POA: Diagnosis not present

## 2019-01-08 DIAGNOSIS — W010XXA Fall on same level from slipping, tripping and stumbling without subsequent striking against object, initial encounter: Secondary | ICD-10-CM | POA: Diagnosis not present

## 2019-01-08 DIAGNOSIS — R400 Somnolence: Secondary | ICD-10-CM | POA: Diagnosis not present

## 2019-01-08 DIAGNOSIS — M542 Cervicalgia: Secondary | ICD-10-CM | POA: Diagnosis not present

## 2019-01-08 DIAGNOSIS — Z853 Personal history of malignant neoplasm of breast: Secondary | ICD-10-CM | POA: Insufficient documentation

## 2019-01-08 DIAGNOSIS — I10 Essential (primary) hypertension: Secondary | ICD-10-CM | POA: Insufficient documentation

## 2019-01-08 DIAGNOSIS — W19XXXA Unspecified fall, initial encounter: Secondary | ICD-10-CM

## 2019-01-08 DIAGNOSIS — S299XXA Unspecified injury of thorax, initial encounter: Secondary | ICD-10-CM | POA: Diagnosis not present

## 2019-01-08 DIAGNOSIS — Z79899 Other long term (current) drug therapy: Secondary | ICD-10-CM | POA: Insufficient documentation

## 2019-01-08 DIAGNOSIS — R000 Tachycardia, unspecified: Secondary | ICD-10-CM | POA: Diagnosis not present

## 2019-01-08 DIAGNOSIS — W07XXXA Fall from chair, initial encounter: Secondary | ICD-10-CM | POA: Diagnosis not present

## 2019-01-08 LAB — CBC
HCT: 44.6 % (ref 36.0–46.0)
Hemoglobin: 15.3 g/dL — ABNORMAL HIGH (ref 12.0–15.0)
MCH: 32.2 pg (ref 26.0–34.0)
MCHC: 34.3 g/dL (ref 30.0–36.0)
MCV: 93.9 fL (ref 80.0–100.0)
Platelets: 172 10*3/uL (ref 150–400)
RBC: 4.75 MIL/uL (ref 3.87–5.11)
RDW: 11.8 % (ref 11.5–15.5)
WBC: 9.2 10*3/uL (ref 4.0–10.5)
nRBC: 0 % (ref 0.0–0.2)

## 2019-01-08 LAB — COMPREHENSIVE METABOLIC PANEL
ALT: 25 U/L (ref 0–44)
AST: 41 U/L (ref 15–41)
Albumin: 3.9 g/dL (ref 3.5–5.0)
Alkaline Phosphatase: 41 U/L (ref 38–126)
Anion gap: 9 (ref 5–15)
BUN: 17 mg/dL (ref 8–23)
CO2: 21 mmol/L — ABNORMAL LOW (ref 22–32)
Calcium: 9 mg/dL (ref 8.9–10.3)
Chloride: 112 mmol/L — ABNORMAL HIGH (ref 98–111)
Creatinine, Ser: 1.02 mg/dL — ABNORMAL HIGH (ref 0.44–1.00)
GFR calc non Af Amer: 51 mL/min — ABNORMAL LOW (ref >60)
GFR, EST AFRICAN AMERICAN: 59 mL/min — AB (ref >60)
Glucose, Bld: 133 mg/dL — ABNORMAL HIGH (ref 70–99)
Potassium: 3.7 mmol/L (ref 3.5–5.1)
Sodium: 142 mmol/L (ref 135–145)
Total Bilirubin: 1.2 mg/dL (ref 0.3–1.2)
Total Protein: 7 g/dL (ref 6.5–8.1)

## 2019-01-08 LAB — TROPONIN I: Troponin I: <0.03 ng/mL (ref <0.03)

## 2019-01-08 MED ORDER — TRAMADOL HCL 50 MG PO TABS: 50.0000 mg | ORAL_TABLET | Freq: Four times a day (QID) | ORAL | 0 refills | Status: DC | PRN

## 2019-01-08 MED ORDER — TRAMADOL HCL 50 MG PO TABS
50.0000 mg | ORAL_TABLET | Freq: Once | ORAL | Status: AC
  Administered 2019-01-09: 50 mg via ORAL
  Filled 2019-01-08: qty 1

## 2019-01-08 NOTE — Discharge Instructions (Signed)
You have been seen in the emergency department after a fall.  Your work-up was largely normal besides a possible very small fracture of T8.  Please follow-up with orthopedics by calling the number provided for further evaluation.  Return to the emergency department for any significant worsening pain, or any other symptom personally concerning to yourself.

## 2019-01-08 NOTE — ED Triage Notes (Signed)
Pt arrived from home via EMS with complaints of an unwittnessed fall at home. Pt states that she got up to go to the bathroom when she felt weak so she sat down, from there she laid down. Pt states that she thinks she was laying there all day. States that her son called EMS tonight because he couldn't get in the door. The fire department had to break the door down. Pt is alert and oriented x 4. EMS reports that pt was covered in urine. VS per EMS BP-188/106 HR-118 O2sat-98% RA CBG-133 Temp-99.2. EMS placed a 20 gauge in left AC.

## 2019-01-08 NOTE — ED Provider Notes (Signed)
[facility] Emergency Department Provider Note  Time seen: 10:10 PM  I have reviewed the triage vital signs and the nursing notes.   HISTORY  Chief Complaint Fall  HPI Kristina Simmons is a 85 y.o. female with a past medical history of arthritis, fibromyalgia, falls, osteoporosis, hypertension, presents to the emergency department after a fall.  According to report EMS was called due to a fall, no one was able to answer the door so the fire department broke down the door and they found the patient on the floor.  Patient states she was trying to do sit down in her chair and she must of missed and fell on the ground.  Patient's only complaint is mild lower back pain she thinks she hit her head and is having mild neck pain.  Patient is awake, she is somewhat somnolent, but when asked about this she states it is light.  She is oriented x3.  Denies any recent fever cough congestion, vomiting, diarrhea, dysuria.   Past Medical History:  Diagnosis Date  . Arthritis   . Breast cancer ([place]) 2010   DCIS; diagnosed on January 15, 2009. Left   . Breast screening, unspecified   . Bursitis    left hip  . Fall   . Fibromyalgia 2011  . Humerus fracture Mar 01, 2016   left  . Kidney stones   . Osteoporosis 08/23/2018  . Personal history of malignant neoplasm of breast 2010   left breast cancer; status post wide excision with mastoplasty followed by whole breast radiation and tamoxifen  . Personal history of radiation therapy 2010   BREAST CA  . Sleep apnea 2006  . Special screening for malignant neoplasms, colon   . Tremor 2011  . Undiagnosed cardiac murmurs   . Unspecified essential hypertension     Patient Active Problem List   Diagnosis Date Noted  . Osteoporosis 08/23/2018  . History of ductal carcinoma in situ (DCIS) of breast 08/18/2018  . Atypical lobular hyperplasia [facility]) of right breast 07/31/2018  . Parkinsonian tremor ([place]) 07/19/2018  . Fatigue  10/27/2017  . Visual disturbance 10/27/2017  . Vitamin B12 deficiency 10/13/2017  . Unstable gait 09/01/2017  . Medication monitoring encounter 09/01/2017  . Small vessel disease, cerebrovascular 09/01/2017  . Cervical spine degeneration 09/01/2017  . Encounter for completion of form with patient 09/01/2017  . History of left breast cancer 09/01/2017  . Neoplasm of uncertain behavior of skin 08/12/2016  . Heartburn 07/27/2016  . Vitamin D deficiency 07/27/2016  . Benign hypertension 07/22/2016  . Humerus fracture 03/01/2016    Past Surgical History:  Procedure Laterality Date  . ABDOMINAL HYSTERECTOMY  1974  . APPENDECTOMY  1974  . BREAST BIOPSY Right 07/10/2018   RIGHT Affirm Bx ("X" clip) path pending  . BREAST EXCISIONAL BIOPSY Left 2010   breast cancer  . BREAST SURGERY Left 2010   left breast wide excision mastoplasty  . COLONOSCOPY  2004  . LITHOTRIPSY  2008  . status post radiation therapy Left    breast cancer  . UPPER GI ENDOSCOPY  May 28, 2010   Biopsy showed evidence of mild chronic gastritis. There was no evidence of H. Pylori infection. A normal duodenum was reported.     Prior to Admission medications   Medication Sig Start Date End Date Taking? Authorizing Provider  calcium carbonate (OS-CAL - DOSED IN MG OF ELEMENTAL CALCIUM) 1250 (500 Ca) MG tablet Take 1 tablet by mouth.    [provider]  Cholecalciferol (VITAMIN D-1000 MAX ST) 1000 units tablet Take 1,000 Units by mouth daily.    [provider]  metoprolol succinate (TOPROL-XL) 25 MG 24 hr tablet Take 1 tablet (25 mg total) by mouth daily. 07/19/18   Arnetha Courser, MD  vitamin B-12 (CYANOCOBALAMIN) 1000 MCG tablet Take 1,000 mcg by mouth daily.    [provider]    No Known Allergies  Family History  Problem Relation Age of Onset  . Thyroid disease Mother   . Heart disease Father   . Breast cancer Neg Hx     Social History Social History   Tobacco Use  .  Smoking status: Never Smoker  . Smokeless tobacco: Never Used  Substance Use Topics  . Alcohol use: No  . Drug use: No    Review of Systems Constitutional: Negative for fever. ENT: Negative for recent illness/congestion Cardiovascular: Negative for chest pain. Respiratory: Negative for shortness of breath. Gastrointestinal: Negative for abdominal pain, vomiting and diarrhea. Genitourinary: Negative for urinary compaints Musculoskeletal: Mild lower back pain.  Mild neck pain. Skin: Negative for skin complaints  Neurological: Negative for headache All other ROS negative  ____________________________________________   PHYSICAL EXAM:  VITAL SIGNS: ED Triage Vitals [01/08/19 2153]  Enc Vitals Group     BP (!) 166/112     Pulse Rate (!) 111     Resp 20     Temp 98.4 F (36.9 C)     Temp Source Oral     SpO2 98 %     Weight 170 lb (77.1 kg)     Height 5\' 5"  (1.651 m)     Head Circumference      Peak Flow      Pain Score      Pain Loc      Pain Edu?      Excl. in [place]?     Constitutional: Alert and oriented.  Somewhat somnolent but awakens to voice answers questions appropriately. Eyes: Normal exam ENT   Head: Normocephalic and atraumatic.   Mouth/Throat: Mucous membranes are moist. Cardiovascular: Normal rate, regular rhythm. Respiratory: Normal respiratory effort without tachypnea nor retractions. Breath sounds are clear  Gastrointestinal: Soft and nontender. No distention.   Musculoskeletal: Good range of motion all extremities without pain elicited.  Mild C-spine tenderness. Neurologic:  Normal speech and language. No gross focal neurologic deficits Skin:  Skin is warm, dry and intact.  Psychiatric: Mood and affect are normal.   ____________________________________________    EKG  EKG viewed and interpreted by myself shows what appears to be sinus rhythm at 108 bpm, narrow QRS, normal axis, normal intervals, no concerning ST  changes.  ____________________________________________    RADIOLOGY  Possible T8 endplate compression fracture otherwise negative for acute abnormalities with CT and radiographic imaging.  ____________________________________________   INITIAL IMPRESSION / ASSESSMENT AND PLAN / ED COURSE  Pertinent labs & imaging results that were available during my care of the patient were reviewed by me and considered in my medical decision making (see chart for details).  Patient presents to the emergency department after an unwitnessed fall.  Overall patient appears well, somewhat somnolent at times but states it is light.  She is oriented x4, largely negative review of systems.  Mild C-spine tenderness on exam.  States she might of hit her head but denies LOC.  We will check CT scan of the head and neck as a precaution as well as x-rays of the T-spine and L-spine.  Given the patient's mild somnolence as well as a fall we will check labs including urinalysis.  Patient agreeable to plan of care.  Labs are largely within normal limits.  CT scans and x-rays are largely negative besides a possible endplate compression fracture of T8.  Appears to be mild.  We will discharge patient with short course of Ultram.  I discussed this with the patient she is agreeable to plan of care.  She is much more awake and alert now.  Asking if she is ready to go home.  We will ambulate the patient, as long as patient can ambulate we will discharge home with a short course of Ultram. ____________________________________________   FINAL CLINICAL IMPRESSION(S) / ED DIAGNOSES  Fall Back pain   Harvest Dark, MD 01/08/19 [phone]

## 2019-01-08 NOTE — ED Notes (Signed)
[name] (son) [phone]

## 2019-01-11 ENCOUNTER — Telehealth: Payer: Self-pay | Admitting: Family Medicine

## 2019-01-11 NOTE — Telephone Encounter (Signed)
Copied from [place] [id number]. Topic: Quick Communication - See Telephone Encounter >> Jan 11, 2019 11:58 AM Bea Graff, NT wrote: CRM for notification. See Telephone encounter for: 01/11/19. Kristina Simmons with [facility] calling and states pt is home after a fall and visiting the ER on 3/23 but is running out of pain meds and needs a refill. Also, requesting home health to come to the home to assist in her care. CB#: [phone]. Please call son in regards, [name]: [phone].

## 2019-01-11 NOTE — Telephone Encounter (Signed)
Pt son notified, ER phone visit scheduled for tomorrow

## 2019-01-12 ENCOUNTER — Telehealth: Payer: Self-pay | Admitting: Family Medicine

## 2019-01-12 ENCOUNTER — Encounter: Payer: Self-pay | Admitting: Nurse Practitioner

## 2019-01-12 ENCOUNTER — Ambulatory Visit (INDEPENDENT_AMBULATORY_CARE_PROVIDER_SITE_OTHER): Payer: PPO | Admitting: Nurse Practitioner

## 2019-01-12 ENCOUNTER — Other Ambulatory Visit: Payer: Self-pay | Admitting: *Deleted

## 2019-01-12 DIAGNOSIS — R5382 Chronic fatigue, unspecified: Secondary | ICD-10-CM | POA: Diagnosis not present

## 2019-01-12 DIAGNOSIS — R2681 Unsteadiness on feet: Secondary | ICD-10-CM | POA: Diagnosis not present

## 2019-01-12 DIAGNOSIS — M549 Dorsalgia, unspecified: Secondary | ICD-10-CM | POA: Diagnosis not present

## 2019-01-12 DIAGNOSIS — S0003XD Contusion of scalp, subsequent encounter: Secondary | ICD-10-CM | POA: Diagnosis not present

## 2019-01-12 DIAGNOSIS — W19XXXD Unspecified fall, subsequent encounter: Secondary | ICD-10-CM | POA: Diagnosis not present

## 2019-01-12 DIAGNOSIS — M818 Other osteoporosis without current pathological fracture: Secondary | ICD-10-CM

## 2019-01-12 DIAGNOSIS — R296 Repeated falls: Secondary | ICD-10-CM | POA: Diagnosis not present

## 2019-01-12 DIAGNOSIS — I700 Atherosclerosis of aorta: Secondary | ICD-10-CM

## 2019-01-12 DIAGNOSIS — F331 Major depressive disorder, recurrent, moderate: Secondary | ICD-10-CM | POA: Diagnosis not present

## 2019-01-12 DIAGNOSIS — G20 Parkinson's disease: Secondary | ICD-10-CM | POA: Diagnosis not present

## 2019-01-12 MED ORDER — ACETAMINOPHEN 500 MG PO TABS: 500.0000 mg | ORAL_TABLET | Freq: Four times a day (QID) | ORAL | 0 refills | Status: AC | PRN

## 2019-01-12 MED ORDER — TRAMADOL HCL 50 MG PO TABS: 50.0000 mg | ORAL_TABLET | Freq: Four times a day (QID) | ORAL | 0 refills | Status: DC | PRN

## 2019-01-12 MED ORDER — ACETAMINOPHEN 500 MG PO TABS: 500.0000 mg | ORAL_TABLET | Freq: Four times a day (QID) | ORAL | 0 refills | Status: DC | PRN

## 2019-01-12 NOTE — Telephone Encounter (Signed)
Patient has virtual visit this afternoon with Kristina Simmons and can be addressed then

## 2019-01-12 NOTE — Patient Outreach (Signed)
Kristina Simmons) Care Management  01/12/2019  Kristina Simmons 1933/11/13 [id number]   CSW received referral from [place], Kingsley Callander for possible placement. CSW called & spoke with patient's son, [name] who states that patient has care covered through this weekend & into next week but the son is thinking that she would benefit from going to SNF for rehab following her fall on Monday, 3/23 and then also for long term care either getting her into an ALF (she really only needs ADL assistance with bathing, uses a walker to go to the bathroom with supervision) or getting 24 hour caregivers in as the son is not able to stay with her around the clock.   She had been to [facility] for rehab about a year ago when she fell in the driveway & broke her femur but son states that he would prefer for her to go to a SNF in [place] to be closer to where he lives. On Monday, 3/23 - she had fallen in the bathroom & spent about 8 hours on the floor before her son found her but states that now she is back up and moving around using a walker but that she has come to a point where she can no longer be by herself. Son states that it is difficult for her getting up to go to the bathroom by herself without being at risk for falling again.   The son states that he had been talking with Ladona Ridgel from [facility] regarding placement but that patient has no retirement or [employer], and gets between $18-1900/month in [employer].   The son, [name] manages a funeral home in [place] & is going back to work Saturday morning, though his wife & daughters have already planned to go and stay with her during the day while he's at work. He said that if ALF placement is not feasible, getting caregivers to come into the home but states that she would likely need around the clock care as she wakes up at night & gets confused about day/time, she'll get up and rumbles around, eating at night -  fixing coffee.   CSW submitted the SNF Rehab Request Form for approval from patient's insurance, [employer] & will wait to see what is decided. Patient has a 2pm telephonic appointment with her PCP, Dr. Enid Derry this afternoon & plans to ask her about doing an FL2 then. CSW also reached out to embedded LCSW at [facility] [place] for assistance/collaboration as well.    Raynaldo Opitz, LCSW [facility]  Clinical Social Worker cell #: [phone]

## 2019-01-12 NOTE — Telephone Encounter (Signed)
Copied from [place] [phone]. Topic: Quick Communication - See Telephone Encounter >> Jan 12, 2019 10:43 AM Reyne Dumas L wrote: CRM for notification. See Telephone encounter for: 01/12/19.  Pt's son, [name], called and left message on [place] box.  States that he would like to speak with Dr. Sanda Klein concerning the level of care his mother is needing and thinks that she needs to transition to a facility. Pt's son also states that he has a question about a medication refill for pt. [name] can be reached at [phone]

## 2019-01-12 NOTE — Progress Notes (Signed)
Name: Kristina Simmons   MRN: [id number]    DOB: Jun 27, 1934   Date:01/12/2019       Progress Note  Subjective  Chief Complaint  Chief Complaint  Patient presents with  . Follow-up    From ER for fall, son wants to see about getting mother in a facility  . Medication Refill    pain med    Virtual Visit via Video Note  I connected with Kristina Simmons on 01/12/19 at  2:20 PM EDT by a video enabled telemedicine application and verified that I am speaking with the correct person using two identifiers.   I discussed the limitations of evaluation and management by telemedicine and the availability of in person appointments. The patient expressed understanding and agreed to proceed. Patient Location: her home Provider Location: Home office  Additional Individuals present: Son- Jakeya Gherardi  History of Present Illness: Patient was seen in the ER on 01/08/2019 due to unwitnessed fall, patient was found by emergency services on her bedroom flor. She does not recall the events but believes she lost balance when getting ready to go the the bathroom and did not fall but sat herself down. She was complaining of lower back pain, mild headache and neck pain in the ER.   CT scan of Head and Neck Impression:  1. Broad-based vertex scalp hematoma without underlying skull fracture. 2. No acute intracranial abnormality with stable non contrast CT appearance of the brain since 2017. 3.  No acute traumatic injury identified in the cervical spine. 4. Mild T3 superior endplate compression is probably chronic.  Xray of Thoracic and Lumbar Spine Impression: 1. Difficult to exclude mild T8 superior endplate compression. Thoracic MRI without contrast or Nuclear Medicine Whole-body Bone Scan would evaluate with the highest sensitivity. 2. No other acute osseous abnormality identified. Chronic or congenital ankylosis of T5-T6 anteriorly. 1. No acute osseous abnormality identified in the  lumbar spine. 2.  Aortic Atherosclerosis (ICD10-I70.0). Labs: GFR 51 improved from 41 4 months ago, electrolytes, liver function and blood counts not concerning.   Patient endorses lower back pain and bilateral shoulder pain Endorses mild headache, that is constant, improved with sitting still.  Patient endorses lightheadedness with position change. Denies bowel/bladder incontinence when awake. States she does sometimes urinate in her sleep- does not wake up- been ongoing for over a year. She has had unsteady gait and generalized weakness. Tramadol provides moderate relief of pain. Denies paresthesias.   States she would like to be in a facility somewhere in [place], as she is concerned about how unsteady she is on her feet and progression of parkinson. Diagnosed with parkinson's disease, not presently on any treatment. Has seen Neurology Dr. Melrose Nakayama and Kristine Linea NP for this.   Since being at home her pain and weakness have progressed  She is unable to get up on her own, required 1-2 person assistance. Unable to prepare her own food, able to feed herself presently but worsening due to parkinson's tremors.  She has not been able to shower for the past year, she had been using wash cloth to clean herself in sink, since this fall she is been unable to care for herself. Has been having more incontinence at night time and more difficult to get up and go to restroom when awake. No pressures ulcers or abrasions noted by her son.   Patient had a history of breast cancer in left breast, 06/2018  Mammogram noted distortion of right breast. Patient did not want  invasive further evaluation. Discussed aromatase for treatment- however has risk for bone thinning.    She has osteoporosis and is on calcium and vitamin D supplementation.   Patient Active Problem List   Diagnosis Date Noted  . Osteoporosis 08/23/2018  . History of ductal carcinoma in situ (DCIS) of breast 08/18/2018  . Atypical lobular  hyperplasia [facility]) of right breast 07/31/2018  . Parkinsonian tremor ([place]) 07/19/2018  . Fatigue 10/27/2017  . Visual disturbance 10/27/2017  . Vitamin B12 deficiency 10/13/2017  . Unstable gait 09/01/2017  . Medication monitoring encounter 09/01/2017  . Small vessel disease, cerebrovascular 09/01/2017  . Cervical spine degeneration 09/01/2017  . Encounter for completion of form with patient 09/01/2017  . History of left breast cancer 09/01/2017  . Neoplasm of uncertain behavior of skin 08/12/2016  . Heartburn 07/27/2016  . Vitamin D deficiency 07/27/2016  . Benign hypertension 07/22/2016  . Humerus fracture 03/01/2016    Social History   Tobacco Use  . Smoking status: Never Smoker  . Smokeless tobacco: Never Used  Substance Use Topics  . Alcohol use: No     Current Outpatient Medications:  .  calcium carbonate (OS-CAL - DOSED IN MG OF ELEMENTAL CALCIUM) 1250 (500 Ca) MG tablet, Take 1 tablet by mouth., Disp: , Rfl:  .  Cholecalciferol (VITAMIN D-1000 MAX ST) 1000 units tablet, Take 1,000 Units by mouth daily., Disp: , Rfl:  .  metoprolol succinate (TOPROL-XL) 25 MG 24 hr tablet, Take 1 tablet (25 mg total) by mouth daily., Disp: 90 tablet, Rfl: 3 .  traMADol (ULTRAM) 50 MG tablet, Take 1 tablet (50 mg total) by mouth every 6 (six) hours as needed., Disp: 10 tablet, Rfl: 0 .  vitamin B-12 (CYANOCOBALAMIN) 1000 MCG tablet, Take 1,000 mcg by mouth daily., Disp: , Rfl:   No Known Allergies  I personally reviewed with the patient/caregiver today.  ROS   Objective  Virtual encounter, vitals not obtained.  There is no height or weight on file to calculate BMI.  Nursing Note and Vital Signs reviewed.  Physical Exam Constitutional:      General: She is not in acute distress.    Appearance: She is ill-appearing. She is not toxic-appearing.  HENT:     Head: Normocephalic.  Eyes:     Extraocular Movements: Extraocular movements intact.     Conjunctiva/sclera:  Conjunctivae normal.  Pulmonary:     Effort: Pulmonary effort is normal.  Skin:    Findings: Bruising (left posterior arm) present. No rash.  Neurological:     General: No focal deficit present.     Mental Status: She is alert and oriented to person, place, and time.     Motor: Weakness present.  Psychiatric:        Mood and Affect: Mood is depressed.        Speech: Speech normal.        Cognition and Memory: Cognition normal.    Patient laying in bed, painful to sit up in bed for visit, ROM in bilateral shoulders intact- limited due to supine position.    No results found for this or any previous visit (from the past 72 hour(s)).  Assessment & Plan  1. Aortic atherosclerosis ([place]) Notes in xray; LDL in 2018 85. Monitor routinely.   2. Fall from standing, subsequent encounter Poor status, was living independently with assistance from son and meals on wheels prior to fall, presently she is unable to get up on her own. She notes increased depression, decreased appetite,  difficult mobility, pain. Discussed with son and patient in detail outpatient management vs consideration for inpatient to SNF for failure to thrive. Spoke to Education officer, museum- Engineer, maintenance (IT). We will work on starting SNF placement, patient son or family will be with her 24/7. If worsening condition they state will take her to the ER. Virtual appointment with PCP on Monday for FL2 and if needed alternate plan  3. Hematoma of scalp, subsequent encounter Nothing visibile on camera at this time, ice and acetaminophen  4. Moderate episode of recurrent major depressive disorder (HCC) Acute- frustration with limitations, address at follow-up   5. Parkinson's disease ([place]) Worsening tremors, follow-up with neuro  6. Other osteoporosis, unspecified pathological fracture presence Continue supplementation, family states met with specialty for further treatment decided not to start   7. Unstable gait SNF placement, inpatient PT   8.  Chronic fatigue Supplementation,   9. Frequent falls SNF  10. Acute mid back pain - traMADol (ULTRAM) 50 MG tablet; Take 1 tablet (50 mg total) by mouth every 6 (six) hours as needed for up to 4 days.  Dispense: 10 tablet; Refill: 0 - acetaminophen (TYLENOL) 500 MG tablet; Take 1 tablet (500 mg total) by mouth every 6 (six) hours as needed.  Dispense: 30 tablet; Refill: 0    -Red flags and when to present for emergency care or RTC including new fall, confusion, dizziness, new/worsening/un-resolving symptoms,  reviewed with patient at time of visit. Follow up and care instructions discussed and provided in AVS. - I discussed the assessment and treatment plan with the patient. The patient was provided an opportunity to ask questions and all were answered. The patient agreed with the plan and demonstrated an understanding of the instructions.  I provided 46 minutes of non-face-to-face time during this encounter and an additional 7 minutes coordinating care with Education officer, museum.   Fredderick Severance, NP   .

## 2019-01-15 ENCOUNTER — Ambulatory Visit (INDEPENDENT_AMBULATORY_CARE_PROVIDER_SITE_OTHER): Payer: PPO | Admitting: *Deleted

## 2019-01-15 ENCOUNTER — Encounter: Payer: Self-pay | Admitting: Family Medicine

## 2019-01-15 ENCOUNTER — Other Ambulatory Visit: Payer: Self-pay | Admitting: *Deleted

## 2019-01-15 ENCOUNTER — Ambulatory Visit (INDEPENDENT_AMBULATORY_CARE_PROVIDER_SITE_OTHER): Payer: PPO | Admitting: Family Medicine

## 2019-01-15 ENCOUNTER — Ambulatory Visit: Payer: Self-pay

## 2019-01-15 DIAGNOSIS — G20 Parkinson's disease: Secondary | ICD-10-CM

## 2019-01-15 DIAGNOSIS — R32 Unspecified urinary incontinence: Secondary | ICD-10-CM

## 2019-01-15 DIAGNOSIS — S22030S Wedge compression fracture of third thoracic vertebra, sequela: Secondary | ICD-10-CM | POA: Diagnosis not present

## 2019-01-15 DIAGNOSIS — S22030A Wedge compression fracture of third thoracic vertebra, initial encounter for closed fracture: Secondary | ICD-10-CM | POA: Insufficient documentation

## 2019-01-15 DIAGNOSIS — R2681 Unsteadiness on feet: Secondary | ICD-10-CM

## 2019-01-15 DIAGNOSIS — S0003XD Contusion of scalp, subsequent encounter: Secondary | ICD-10-CM

## 2019-01-15 DIAGNOSIS — M818 Other osteoporosis without current pathological fracture: Secondary | ICD-10-CM | POA: Diagnosis not present

## 2019-01-15 DIAGNOSIS — I10 Essential (primary) hypertension: Secondary | ICD-10-CM | POA: Diagnosis not present

## 2019-01-15 DIAGNOSIS — I679 Cerebrovascular disease, unspecified: Secondary | ICD-10-CM

## 2019-01-15 NOTE — Assessment & Plan Note (Signed)
Per DEXA report notes and f/u, patient refused treatment and referral to endocrinologist; she was going to increase calcium and vit D; fall precautions, physical therapy, walker, placement

## 2019-01-15 NOTE — Assessment & Plan Note (Signed)
Likely chronic per CT report; patient using tylenol for pain control with good relief for the most part; may use tramadol PRN for worse pain

## 2019-01-15 NOTE — Chronic Care Management (AMB) (Signed)
  Chronic Care Management   Social Work Note  01/15/2019 Name: [name] MRN: [id number] DOB: 1934/06/17  Kristina Simmons is a 85 y.o. year old female who sees Lada, Satira Anis, MD for primary care. The CCM team was consulted for assistance with Level of Care Concerns.   Goals Addressed   None    Collaboration phone call from [place] worker, Raynaldo Opitz to assist with obtaining the [facility] for placement purposes. Patient visited the emergency room on  01/08/19 and it was determined upon her return home that she needed care in a skilled nursing facility. Raynaldo Opitz has obtained authorization from [employer], however the facilities are requiring in addition to the FL2, vitals over a period time as well as a COVID 19 screening. [facility] has agreed to visit patient's home to take patient's vitals and complete COVID 19 screening. This Education officer, museum to work on obtaining the [facility]. The goals is for patient to be placed at [facility] by Friday with the documentation of patient be free from a temperature over a period of time and the COVID 19 screening being complete.     Follow Up Plan: SW will follow up with [place] worker and the facility  by phone over the next 5 days regarding the status of the SNF placement.  Elliot Gurney, [place] Administrator, [facility] Care Management [phone]

## 2019-01-15 NOTE — Assessment & Plan Note (Signed)
Getting progressively worse per son; unable to prepare meals and has difficulty even with self-feeding, as hand shakes getting close to mouth; applying for nursing home placement

## 2019-01-15 NOTE — Chronic Care Management (AMB) (Signed)
Chronic Care Management   Initial Visit Note  01/15/2019 Name: Kristina Simmons MRN: [id number] DOB: Sep 09, 1934  Subjective: "I just need to get somewhere where people can help me" "I have been in my home for two weeks"  Objective:  Wt Readings from Last 3 Encounters:  01/08/19 170 lb (77.1 kg)  11/30/18 159 lb 14.4 oz (72.5 kg)  08/18/18 155 lb (70.3 kg)   Temp Readings from Last 3 Encounters:  01/15/19 98 F (36.7 C)  01/08/19 98.4 F (36.9 C) (Oral)  11/30/18 (!) 97.5 F (36.4 C) (Oral)   BP Readings from Last 3 Encounters:  01/15/19 (!) 148/82  01/09/19 (!) 158/92  11/30/18 130/86   Pulse Readings from Last 3 Encounters:  01/15/19 96  01/09/19 100  11/30/18 80     Assessment: Kristina Simmons is a 85 year old female patient of Dr. Enid Derry who is referred to [facility] RN CM for support and assistance with SNF admission. Kristina Simmons has a history of but not limited to HTN, Compression fracture of T3 vertebra, unsteable gait, Visual disturbance, and Cerebrovascular disease. Last PCP visit was today via tele-health.  CCM RN CM received an incoming call today from [place], [facility] LCSW for ongoing collaboration to achieve patient admission to a SNF secondary to recent fall and son's inability to care for patient at home. Per LCSW, who has also been collaborating with [place]  Raynaldo Opitz, patient's vitals must be monitored by clinical personal and reported to rule out COVID-19 symptoms prior to admission.  CCM RN CM made home visit today to perform physical assessment and completion of required COVID-19 Pre-Admission Screening Tool required for admission to [facility] in [place], [place].  Patient vitals as follows: HR 96 and regular, BP 142/82 R arm, Supine, Temp 98.0 Oral, SPO2 97%. Screening tool negative for COVID-19 Risk (all questions answered as NO)  Goals Addressed            This Visit's Progress   . "I really  want to get somewhere where people can help me" (pt-stated)       Current Barriers:  Marland Kitchen Knowledge Deficits related to process for SNF admission related to COVID-19 pandemic  . Inability to self care  Nurse Case Manager Clinical Goal(s):  Marland Kitchen Over the next 4 days, patient will meet with RN Care Manager to address meeting requirment for SNF placement . Over the next 7 days, patient will not experience hospital admission. Hospital Admissions in last 6 months = 0 . Over the next 7 days, patient will work with CM clinical social worker to gain admission to SNF   . Provided emotional support and reassurance related to COVID-19 . Reviewed CDC guidelines for COVID-19 infection prevention including hand hygiene, social distancing, and symptom reporting  Interventions:  . Provided education to patient re: reason for daily home visits by [facility] RN CM . Completed assessments needed to meet SNF admission criteria . Collaborated with [place] Worker, [place], and PCP Dr. Enid Derry regarding plan of care for gaining admission to SNF . Discussed plans with patient for ongoing care management follow up and provided patient with direct contact information for care management team  Patient Self Care Activities:  . Currently UNABLE TO independently perform ADLs and IADLs  . Patient/caregiver will follow CDC guidelines for COVID-19 infection prevention . Patient/caregiver will willing continue to engage with CCM RN CM and [place] to gain admission  to SNF  Initial goal documentation       Follow up plan:  Face to Face appointment with CCM team member scheduled for tomorrow 01/16/2019 at 10:00 for ongoing assessment and goal interventions   Kristina Simmons was given information about Chronic Care Management services today including:  1. CCM service includes personalized support from designated clinical staff supervised by her physician, including individualized plan of care and  coordination with other care providers 2. 24/7 contact phone numbers for assistance for urgent and routine care needs. 3. Service will only be billed when office clinical staff spend 20 minutes or more in a month to coordinate care. 4. Only one practitioner may furnish and bill the service in a calendar month. 5. The patient may stop CCM services at any time (effective at the end of the month) by phone call to the office staff. 6. The patient will be responsible for cost sharing (co-pay) of up to 20% of the service fee (after annual deductible is met).  Patient agreed to services and verbal consent obtained.    Kalijah Westfall E. Rollene Rotunda, RN, BSN Nurse Care Coordinator [facility] Care Management  [phone]

## 2019-01-15 NOTE — Assessment & Plan Note (Signed)
Last BP and pulse in ER were high, but that was during acute visit, after fall, patient in pain, son could not be with her, etc.; son does not have a way to check vitals at her home; will ask facility to check BP weekly and contact MD if greater than 150/90; she is limiting salt; continue beta-blocker

## 2019-01-15 NOTE — Assessment & Plan Note (Signed)
Patient is at high risk for further falls; refer to PT; using walker; family assisting now but needs to be in a monitored environment; will complete FL-2

## 2019-01-15 NOTE — Progress Notes (Signed)
There were no vitals taken for this visit.   Subjective:    Patient ID: Kristina Simmons, female    DOB: [birth year], 85 y.o.   MRN: [id number]  HPI: Kristina Simmons is a 85 y.o. female  Chief Complaint  Patient presents with  . Follow-up    hand trembling is worse, urinary incontinence.     HPI Virtual Visit via Telephone Note   I connected with Mickeal Needy on January 15, 2019 at 11:14 am EDT by telephone and verified that I am speaking with the correct person using two identifiers.   Staff discussed the limitations, risks, security, and privacy concerns of performing an evaluation and management service by telephone and the availability of in-person appointments. Staff discussed with the patient that he/she may be responsible for charges related to this service. The patient expressed understanding and agreed to proceed. The patient was asked to check her surroundings, be sure to keep phone off of speaker, let me know immediately if someone comes near and our conversation has to be terminated or paused for privacy concerns.  Additional participants: Kristina Simmons  Patient location: home Provider location: home  Call started: 11:14 am Call terminated: 11:35 am Total length of call: 21 minutes  She fell first thing in the morning on 2023/01/31 Her husband died April 02, 2024last year She has done relatively well until Monday Son visits 1-2 x a week; he speaks with her daily on the phone He couldn't reach her Monday, went to voicemail; he went to the house to [place], didn't seem right, came in to the garage; five steps to the landing, meals on wheels meal sitting on the stairs; safety latch on the inside fixed, he was yelling for her, dialed 911; fire chief got there; they forced the door; she was laying on the floor, laid there 8 hours; wet on herself; they checked her sats; BP was elevated; they kept her until 2 am; he couldn't go in the ER (COVID-19); they released her from the  ER; no flat surface going into the house Son returned to work and family members cared for her over the weekend Mental status has declined; trembling is getting worse; progressing Wetting herself in her sleep; no bowel incontinence She is not able to bathe herself; very unsteady with rolling type walker; she is only up to go to the bathroom; she depends on someone else Son has been in contact with the Education officer, museum; they have it worked out; she is approved for health team advantage; she will be transitioned to rehabilitation center and then stay there in assisted living; they have 7-8 locations that she qualifies for She does need an FL-2 She has memory impairment; not threatening; no wandering behavior Incontinence of bladder, not bowel No bed sores, no skin breakdown Needs food set-up, she is able to feed herself but needs assistance because of the trembling Parkinsons is slowly advancing and seems worse after the fall No supplemental oxygen needs No dietary restrictions Appetite is poor; has been poor prior to the fall; does not cook not safe to cook, does not have the energy; was getting one hot meal a day at lunch, that has been her nutrition basically; otherwise, ate mini wheats or peanut butter No hearing aides; has glasses but only for reading Claiborne Billings is the Education officer, museum   Tramadol for the compression fracture; she is taking those just periodically; using tylenol for the most; tylenol is helping; she asked about a heating pad (cautions  given, auto shut-off, lowest setting, etc.); he will not let her sleep with it at night  IMPRESSION: 1. Broad-based vertex scalp hematoma without underlying skull fracture. 2. No acute intracranial abnormality with stable non contrast CT appearance of the brain since 2017. 3.  No acute traumatic injury identified in the cervical spine. 4. Mild T3 superior endplate compression is probably chronic.   Electronically Signed   By: Genevie Ann M.D.   On:  01/08/2019 22:55  Hypertension; check BPs once a week on FL-2; contact doctor if >150/90 No added salt  Her PHQ-9 score; she denies HI; just feels terrible; so confused; wakes up and doesn't know what day it is, doesn't know what time it is; takes her a while to get into the groove; she denies SI; she will not be left alone at all until she is transitioned to somewhere; family will watch her; she is trying to stand up straight and looks like she will stumble and take back steps; high fall risk  ORDER PT/OT No weakness of voice or trouble communicating; just confusion No dysphagia  Depression screen [facility] 2/9 01/12/2019 11/30/2018 07/19/2018 07/19/2018 10/13/2017  Decreased Interest 3 0 0 0 1  Down, Depressed, Hopeless 3 0 0 0 1  PHQ - 2 Score 6 0 0 0 2  Altered sleeping 3 - 3 - 1  Tired, decreased energy 3 - 3 - 2  Change in appetite 3 - 3 - 1  Feeling bad or failure about yourself  3 - 3 - 0  Trouble concentrating 3 - 3 - 0  Moving slowly or fidgety/restless 3 - 0 - 0  Suicidal thoughts 3 - 0 - 0  PHQ-9 Score 27 - 15 - 6  Difficult doing work/chores Extremely dIfficult - Somewhat difficult - -  MD note: she does not sound depressed today, March 30th; she denies SI/HI  Fall Risk  01/15/2019 01/12/2019 11/30/2018 07/19/2018 10/13/2017  Falls in the past year? 1 1 0 Yes No  Number falls in past yr: 1 1 0 2 or more -  Injury with Fall? 0 1 0 Yes -  Follow up - - Falls prevention discussed - -    Relevant past medical, surgical, family and social history reviewed Past Medical History:  Diagnosis Date  . Arthritis   . Breast cancer ([place]) 2010   DCIS; diagnosed on January 15, 2009. Left   . Breast screening, unspecified   . Bursitis    left hip  . Fall   . Fibromyalgia 2011  . Humerus fracture Mar 01, 2016   left  . Kidney stones   . Osteoporosis 08/23/2018  . Personal history of malignant neoplasm of breast 2010   left breast cancer; status post wide excision with mastoplasty followed  by whole breast radiation and tamoxifen  . Personal history of radiation therapy 2010   BREAST CA  . Sleep apnea 2006  . Special screening for malignant neoplasms, colon   . Tremor 2011  . Undiagnosed cardiac murmurs   . Unspecified essential hypertension    Past Surgical History:  Procedure Laterality Date  . ABDOMINAL HYSTERECTOMY  1974  . APPENDECTOMY  1974  . BREAST BIOPSY Right 07/10/2018   RIGHT Affirm Bx ("X" clip) path pending  . BREAST EXCISIONAL BIOPSY Left 2010   breast cancer  . BREAST SURGERY Left 2010   left breast wide excision mastoplasty  . COLONOSCOPY  2004  . LITHOTRIPSY  2008  . status post radiation therapy Left  breast cancer  . UPPER GI ENDOSCOPY  May 28, 2010   Biopsy showed evidence of mild chronic gastritis. There was no evidence of H. Pylori infection. A normal duodenum was reported.    Family History  Problem Relation Age of Onset  . Thyroid disease Mother   . Heart disease Father   . Breast cancer Neg Hx    Social History   Tobacco Use  . Smoking status: Never Smoker  . Smokeless tobacco: Never Used  Substance Use Topics  . Alcohol use: No  . Drug use: No     Office Visit from 01/15/2019 in [facility]  AUDIT-C Score  0      Interim medical history since last visit reviewed. Allergies and medications reviewed  Review of Systems Per HPI unless specifically indicated above     Objective:    There were no vitals taken for this visit.  Wt Readings from Last 3 Encounters:  01/08/19 170 lb (77.1 kg)  11/30/18 159 lb 14.4 oz (72.5 kg)  08/18/18 155 lb (70.3 kg)    Physical Exam Pulmonary:     Effort: No respiratory distress.  Neurological:     Mental Status: She is alert.     Cranial Nerves: No dysarthria.  Psychiatric:        Speech: Speech is not rapid and pressured, delayed or slurred.     Results for orders placed or performed during the hospital encounter of 01/08/19  CBC  Result Value Ref  Range   WBC 9.2 4.0 - 10.5 K/uL   RBC 4.75 3.87 - 5.11 MIL/uL   Hemoglobin 15.3 (H) 12.0 - 15.0 g/dL   HCT 44.6 36.0 - 46.0 %   MCV 93.9 80.0 - 100.0 fL   MCH 32.2 26.0 - 34.0 pg   MCHC 34.3 30.0 - 36.0 g/dL   RDW 11.8 11.5 - 15.5 %   Platelets 172 150 - 400 K/uL   nRBC 0.0 0.0 - 0.2 %  Comprehensive metabolic panel  Result Value Ref Range   Sodium 142 135 - 145 mmol/L   Potassium 3.7 3.5 - 5.1 mmol/L   Chloride 112 (H) 98 - 111 mmol/L   CO2 21 (L) 22 - 32 mmol/L   Glucose, Bld 133 (H) 70 - 99 mg/dL   BUN 17 8 - 23 mg/dL   Creatinine, Ser 1.02 (H) 0.44 - 1.00 mg/dL   Calcium 9.0 8.9 - 10.3 mg/dL   Total Protein 7.0 6.5 - 8.1 g/dL   Albumin 3.9 3.5 - 5.0 g/dL   AST 41 15 - 41 U/L   ALT 25 0 - 44 U/L   Alkaline Phosphatase 41 38 - 126 U/L   Total Bilirubin 1.2 0.3 - 1.2 mg/dL   GFR calc non Af Amer 51 (L) >60 mL/min   GFR calc Af Amer 59 (L) >60 mL/min   Anion gap 9 5 - 15  Troponin I - ONCE - STAT  Result Value Ref Range   Troponin I <0.03 <0.03 ng/mL  Labs reviewed, drawn after patient had been on the floor for perhaps 8 hours all day, wet herself, was likely very dehydrated    Assessment & Plan:   Problem List Items Addressed This Visit      Cardiovascular and Mediastinum   Benign hypertension (Chronic)    Last BP and pulse in ER were high, but that was during acute visit, after fall, patient in pain, son could not be with her, etc.; son does  not have a way to check vitals at her home; will ask facility to check BP weekly and contact MD if greater than 150/90; she is limiting salt; continue beta-blocker        Musculoskeletal and Integument   Osteoporosis    Per DEXA report notes and f/u, patient refused treatment and referral to endocrinologist; she was going to increase calcium and vit D; fall precautions, physical therapy, walker, placement      Compression fracture of T3 vertebra (HCC)    Likely chronic per CT report; patient using tylenol for pain control with  good relief for the most part; may use tramadol PRN for worse pain        Other   Unstable gait    Patient is at high risk for further falls; refer to PT; using walker; family assisting now but needs to be in a monitored environment; will complete FL-2      Parkinsonian tremor ([place]) - Primary    Getting progressively worse per son; unable to prepare meals and has difficulty even with self-feeding, as hand shakes getting close to mouth; applying for nursing home placement       Other Visit Diagnoses    Urinary incontinence, unspecified type       I did not see urinalysis during ER visit (likely dehydrated, could not go perhaps); will recommend urine dip be collected upon arrival at facility   Hematoma of scalp, subsequent encounter       suffered from fall; reviewed ER note, CT reports; no skull fracture      Follow up plan: No follow-ups on file.  An after-visit summary was printed and given to the patient at [place].  Please see the patient instructions which may contain other information and recommendations beyond what is mentioned above in the assessment and plan.

## 2019-01-15 NOTE — Patient Outreach (Signed)
[facility]) Care Management  01/15/2019  LATRICA CLOWERS 11-Oct-1934 [id number]   CSW received authorization from [facility] for SNF admission and has been working on trying to secure a placement. Most facilities are saying they are not willing to risk taking a patient from home at all with COVID-19 going around, [facility] however is saying that they would be willing to consider her if she has no fever/symptoms for atleast 5 days. [facility] RNCM, Truddie Crumble has agreed to go daily to check her and complete this COVID-19 Pre-Admission Screening Tool they have sent (starting today) so that Friday hopefully she'll still not have any symptoms and we can have the MD sign the FL2 in the meantime [facility], embedded LCSW with that practice is working on that) and CSW will resubmit the [facility] SNF Request Form Thursday afternoon and hopefully we can get her admitted to [place] made the son aware & he is agreeable with this plan.    Raynaldo Opitz, LCSW [facility]  Clinical Social Worker cell #: [phone]

## 2019-01-15 NOTE — Patient Instructions (Signed)
1. Thank You for allowing the CCM (Chronic Care Management) Team to assist you with your healthcare goals!! It was nice meeting you and your son today. 2. Contact the CCM Team if you have any question or concerns related to information discussed today  CCM (Chronic Care Management) Team   Trish Fountain RN, BSN Nurse Care Coordinator  [phone]  Ruben Reason PharmD  Clinical Pharmacist  [phone]   [place], LCSW Clinical Social Worker [phone]  Goals Addressed            This Visit's Progress   . "I really want to get somewhere where people can help me" (pt-stated)       Current Barriers:  Marland Kitchen Knowledge Deficits related to process for SNF admission related to COVID-19 pandemic  . Inability to self care  Nurse Case Manager Clinical Goal(s):  Marland Kitchen Over the next 4 days, patient will meet with RN Care Manager to address meeting requirment for SNF placement . Over the next 7 days, patient will not experience hospital admission. Hospital Admissions in last 6 months = 0 . Over the next 7 days, patient will work with CM clinical social worker to gain admission to SNF   . Provided emotional support and reassurance related to COVID-19 . Reviewed CDC guidelines for COVID-19 infection prevention including hand hygiene, social distancing, and symptom reporting  Interventions:  . Provided education to patient re: reason for daily home visits by [facility] RN CM . Completed assessments needed to meet SNF admission criteria . Collaborated with [place] Worker, [place], and PCP Dr. Enid Derry regarding plan of care for gaining admission to SNF . Discussed plans with patient for ongoing care management follow up and provided patient with direct contact information for care management team  Patient Self Care Activities:  . Currently UNABLE TO independently perform ADLs and IADLs  . Patient/caregiver will follow CDC guidelines for COVID-19 infection  prevention . Patient/caregiver will willing continue to engage with CCM RN CM and [place] to gain admission to SNF  Initial goal documentation     Ms. Huard was given information about Chronic Care Management services today including:  1. CCM service includes personalized support from designated clinical staff supervised by her physician, including individualized plan of care and coordination with other care providers 2. 24/7 contact phone numbers for assistance for urgent and routine care needs. 3. Service will only be billed when office clinical staff spend 20 minutes or more in a month to coordinate care. 4. Only one practitioner may furnish and bill the service in a calendar month. 5. The patient may stop CCM services at any time (effective at the end of the month) by phone call to the office staff. 6. The patient will be responsible for cost sharing (co-pay) of up to 20% of the service fee (after annual deductible is met).  Patient agreed to services and verbal consent obtained.

## 2019-01-16 ENCOUNTER — Other Ambulatory Visit: Payer: Self-pay

## 2019-01-16 ENCOUNTER — Ambulatory Visit: Payer: PPO

## 2019-01-16 VITALS — BP 140/88 | HR 90 | Temp 97.7°F | Resp 17

## 2019-01-16 DIAGNOSIS — I679 Cerebrovascular disease, unspecified: Secondary | ICD-10-CM

## 2019-01-16 DIAGNOSIS — I10 Essential (primary) hypertension: Secondary | ICD-10-CM

## 2019-01-16 DIAGNOSIS — M818 Other osteoporosis without current pathological fracture: Secondary | ICD-10-CM | POA: Diagnosis not present

## 2019-01-16 DIAGNOSIS — G20 Parkinson's disease: Secondary | ICD-10-CM

## 2019-01-16 DIAGNOSIS — G20A1 Parkinson's disease without dyskinesia, without mention of fluctuations: Secondary | ICD-10-CM

## 2019-01-16 DIAGNOSIS — S22030S Wedge compression fracture of third thoracic vertebra, sequela: Secondary | ICD-10-CM

## 2019-01-16 NOTE — Chronic Care Management (AMB) (Signed)
Chronic Care Management   Follow Up Note   01/16/2019 Name: Kristina Simmons MRN: [id number] DOB: April 11, 1934  Referred by: Arnetha Courser, MD Reason for referral : Chronic Care Management (daily covid-19 screening)   Subjective: "I wish my back would quit hurting and I could move around better"   Objective:   Temp Readings from Last 3 Encounters:  01/16/19 97.7 F (36.5 C) (Oral)  01/15/19 98 F (36.7 C)  01/08/19 98.4 F (36.9 C) (Oral)   BP Readings from Last 3 Encounters:  01/16/19 140/88  01/15/19 (!) 148/82  01/09/19 (!) 158/92   Pulse Readings from Last 3 Encounters:  01/16/19 90  01/15/19 96  01/09/19 100    Assessment: Kristina Simmons is a 85 year old female patient of Dr. Enid Derry who is referred to [facility] RN CM for support and assistance with SNF admission. Kristina Simmons has a history of but not limited to HTN, Compression fracture of T3 vertebra, unsteable gait, Visual disturbance, and Cerebrovascular disease. Last PCP visit was 01/15/2019 via tele-health.  CCM RN CM received an incoming call 01/15/2019 from Kristina Simmons, [facility] LCSW for ongoing collaboration to achieve patient admission to a SNF secondary to recent fall and son's inability to care for patient at home. Per LCSW, who has also been collaborating with Kristina Simmons  Kristina Simmons, patient's vitals must be monitored by clinical personal and reported to rule out COVID-19 symptoms prior to admission.  CCM RN CM made initial home visit 01/15/2019 to perform physical assessment and completion of required COVID-19 Pre-Admission Screening Tool required for admission to [facility] in [place], [place]. Second daily assessment completed today.  Patient vitals as follows: HR 90 and regular, BP 140/88 R arm, Supine, Temp 97.7 Oral, SPO2 97%. Screening tool negative for COVID-19 Risk (all questions answered as NO)  Goals Addressed            This Visit's Progress   . "I really want to get somewhere where people can help me" (pt-stated)       Current Barriers:  Marland Kitchen Knowledge Deficits related to process for SNF admission related to COVID-19 pandemic  . Inability to self care  Nurse Case Manager Clinical Goal(s):  Marland Kitchen Over the next 4 days, patient will meet with RN Care Manager to address meeting requirment for SNF placement . Over the next 7 days, patient will not experience hospital admission. Hospital Admissions in last 6 months = 0 . Over the next 7 days, patient will work with CM clinical social worker to gain admission to SNF   . Provided emotional support and reassurance related to COVID-19 . Reviewed CDC guidelines for COVID-19 infection prevention including hand hygiene, social distancing, and symptom reporting  Interventions:  . Completed assessments needed to meet SNF admission criteria . Collaborated with [place] Worker, [place], and PCP Dr. Enid Derry regarding plan of care for gaining admission to SNF . Discussed plans with patient for ongoing care management follow up and provided patient with direct contact information for care management team  Patient Self Care Activities:  . Currently UNABLE TO independently perform ADLs and IADLs  . Patient/caregiver will follow CDC guidelines for COVID-19 infection prevention . Patient/caregiver will willing continue to engage with CCM RN CM and [place] to gain admission to SNF  Please see past updates related to this goal by clicking on the "Past Updates" button in the selected goal  Face to Face appointment with CCM team member scheduled for: tomorrow 01/17/2019     Katja Blue E. Rollene Rotunda, RN, BSN Nurse Care Coordinator [facility] Care Management  [phone]

## 2019-01-17 ENCOUNTER — Ambulatory Visit: Payer: Self-pay | Admitting: *Deleted

## 2019-01-17 ENCOUNTER — Ambulatory Visit (INDEPENDENT_AMBULATORY_CARE_PROVIDER_SITE_OTHER): Payer: PPO | Admitting: *Deleted

## 2019-01-17 ENCOUNTER — Telehealth: Payer: Self-pay | Admitting: Family Medicine

## 2019-01-17 ENCOUNTER — Ambulatory Visit: Payer: Self-pay

## 2019-01-17 ENCOUNTER — Other Ambulatory Visit: Payer: Self-pay | Admitting: Family Medicine

## 2019-01-17 DIAGNOSIS — M818 Other osteoporosis without current pathological fracture: Secondary | ICD-10-CM

## 2019-01-17 DIAGNOSIS — S22030S Wedge compression fracture of third thoracic vertebra, sequela: Secondary | ICD-10-CM

## 2019-01-17 DIAGNOSIS — M47812 Spondylosis without myelopathy or radiculopathy, cervical region: Secondary | ICD-10-CM

## 2019-01-17 DIAGNOSIS — M549 Dorsalgia, unspecified: Secondary | ICD-10-CM

## 2019-01-17 MED ORDER — TRAMADOL HCL 50 MG PO TABS: 50.0000 mg | ORAL_TABLET | Freq: Four times a day (QID) | ORAL | Status: DC | PRN

## 2019-01-17 MED ORDER — CALCIUM CARBONATE 1250 (500 CA) MG PO TABS: 1.0000 | ORAL_TABLET | Freq: Every day | ORAL | Status: AC

## 2019-01-17 NOTE — Telephone Encounter (Signed)
I realized her FL-2 medicine list needed adjusting No milligram amount for calcium (should say 500 mg) Vitamin D should be dosed in international units (I.U.), not units Tramadol is every six hours PRN, not daily There is no route for the vitamin B12 (should be by mouth)  I called [facility] but they don't have her there and don't see her as going to be admitted I called son, Della Scrivener She is going in the morning We'll correct the med list and re-fax in the morning ----------------------------------------------- Chrystal, I have corrected the FL-2 and am faxing that back to you now Please FAX to facility and make sure they use the corrected version with my updates  Truddie Crumble called me back after hours and I explained that I need the corrected FL-2 sent to the facility

## 2019-01-17 NOTE — Chronic Care Management (AMB) (Signed)
  Chronic Care Management    Clinical Social Work Follow Up Note  01/17/2019 Name: Kristina Simmons MRN: [id number] DOB: 13-Jul-1934  Kristina Simmons is a 85 y.o. year old female who is a primary care patient of Lada, Satira Anis, MD. The CCM team was consulted for assistance with Level of Care Concerns.   This Education officer, museum collaborated with patient's [facility] community social worker to facilitate her transition to a SNF from home for rehab. Per [facility] social worker, patient's son felt that she would benefit from SNF care for rehab following a fall on Monday, 01/08/19 where she spent about 8 hours on the floor before her son found her. [name]  believes that patient will likely need long term care following her SNF stay as he is unable to provide 24 hour care for patient. Patient  accepted a bed offer and was transitioned to [facility] today.  Review of patient status, including review of consultants reports, other relevant assessments, and collaboration with appropriate care team members and the patient's provider was performed as part of comprehensive patient evaluation and provision of chronic care management services.       Goals Addressed            This Visit's Progress   . "I really want to get somewhere where people can help me" (pt-stated)       Current Barriers:  Marland Kitchen Knowledge Deficits related to process for SNF admission related to COVID-19 pandemic  . Inability to self care  Social Work Clinical Goal(s):   Marland Kitchen Over the next 7 days this Education officer, museum will work collaboratively with patient's provider and the [place] worker to gain admission to a SNF for rehab.   Interventions:  . Collaborated with CCM RNCM, [place] Education officer, museum , and PCP Dr. Enid Derry regarding plan of care for gaining admission to SNF . Assisted provider with completion of FL2 and faxed to [facility] for review.  Patient Self Care Activities:  . Currently UNABLE TO  independently perform ADLs and IADLs  . Patient/caregiver will willing continue to engage with CCM RN CM and social work to gain admission to SNF  Please see past updates related to this goal by clicking on the "Past Updates" button in the selected goal          Follow Up Plan: Client/client's son will follow up with this social worker for continued social work needs post discharge from the SNF.   Elliot Gurney, [place] Administrator, arts Center/[facility] Care Management [phone]

## 2019-01-17 NOTE — Chronic Care Management (AMB) (Signed)
  Chronic Care Management   Social Work Note  01/17/2019 Name: Kristina Simmons MRN: [id number] DOB: [birth year]  Kristina Simmons is a 85 y.o. year old female who sees Lada, Satira Anis, MD for primary care. The CCM team was consulted for assistance with Level of Care Concerns.   This Education officer, museum informed by patient's PCP that the medications listed on the FL2 needed to be corrected. Dr. Sanda Klein faxed the corrected FL2 to this social worker. FL2 was then faxed to Mardene Celeste in admissions at [facility]. Per patient's EMR, patient to arrive there tomorrow morning.  Goals Addressed   None     Follow Up Plan: This Education officer, museum will follow up with [facility] in the morning to ensure that they have received the corrected FL2.   Elliot Gurney, [place] Administrator, arts Center/[facility] Care Management [phone]

## 2019-01-17 NOTE — Chronic Care Management (AMB) (Signed)
Chronic Care Management   Follow Up Note   01/17/2019 Name: Kristina Simmons MRN: [id number] DOB: May 21, 1934  Referred by: Arnetha Courser, MD Reason for referral : Chronic Care Management (telephone call-SNF placement)   Subjective: per son "I have already talked with Claiborne Billings this morning and understand mom can go to [facility] today if they can get the FL2"   Objective:  Wt Readings from Last 3 Encounters:  01/08/19 170 lb (77.1 kg)  11/30/18 159 lb 14.4 oz (72.5 kg)  08/18/18 155 lb (70.3 kg)   Temp Readings from Last 3 Encounters:  01/16/19 97.7 F (36.5 C) (Oral)  01/15/19 98 F (36.7 C)  01/08/19 98.4 F (36.9 C) (Oral)   BP Readings from Last 3 Encounters:  01/16/19 140/88  01/15/19 (!) 148/82  01/09/19 (!) 158/92   Pulse Readings from Last 3 Encounters:  01/16/19 90  01/15/19 96  01/09/19 100    Assessment: Kristina Simmons is a 85 year old female patient of Dr. Enid Derry who is referred to [facility] RN CM for support and assistance with SNF admission. Kristina Simmons has a history of but not limited to HTN, Compression fracture of T3 vertebra, unsteable gait, Visual disturbance, and Cerebrovascular disease. Last PCP visit was 01/15/2019 via tele-health.  Goals Addressed            This Visit's Progress   . COMPLETED: "I really want to get somewhere where people can help me" (pt-stated)       CCM RN CM received incoming call from [employer], [place] who is collaborating with [place] to assist patient/son with SNF placement. [place] states patient has offer (bed available) at [facility] and [facility]. States son has been provided options and will accept [facility] bed. Clinic SW states home visit for vital check is not needed today. CCM RN CM notified son and assessed for additional needs. No needs identified. Son is prepared to transport patient to SNF when appropriate. RN CM goals met. [place]  will continue to assist patient/son until transfer complete.  Current Barriers:  Marland Kitchen Knowledge Deficits related to process for SNF admission related to COVID-19 pandemic  . Inability to self care  Nurse Case Manager Clinical Goal(s):  Marland Kitchen Over the next 4 days, patient will meet with RN Care Manager to address meeting requirment for SNF placement . Over the next 7 days, patient will not experience hospital admission. Hospital Admissions in last 6 months = 0 . Over the next 7 days, patient will work with CM clinical social worker to gain admission to SNF   . Provided emotional support and reassurance related to COVID-19 . Reviewed CDC guidelines for COVID-19 infection prevention including hand hygiene, social distancing, and symptom reporting  . Interventions:  . Collaborated with [place] Worker, [place], and PCP Dr. Enid Derry regarding plan of care for gaining admission to SNF Collaborated with CCM Team to facilitate [facility] signage  Patient Self Care Activities:  . Currently UNABLE TO independently perform ADLs and IADLs  . Patient/caregiver will follow CDC guidelines for COVID-19 infection prevention . Patient/caregiver will willing continue to engage with CCM RN CM and [place] to gain admission to SNF  Please see past updates related to this goal by clicking on the "Past Updates" button in the selected goal          Ms. Kristina Simmons has met all care  management goals. Review of patient status, including review of consultants reports, relevant laboratory and other test results, and collaboration with appropriate care team members and the patient's provider was performed as part of comprehensive patient evaluation and provision of chronic care management services.  The care management team is available to Kristina Simmons at any time to assist with care management needs should they arise. Kristina Simmons has been given contact information and instructions  to contact the care management team with any questions or should new care management needs arise.     Loralee Kristina Simmons E. Rollene Rotunda, RN, BSN Nurse Care Coordinator [facility] Care Management  [phone]

## 2019-01-17 NOTE — Patient Instructions (Addendum)
Congratulations! You have met all case management goals! You may call the case management team at any time should you have a question or if you have new case management needs. We are happy to help you! We will let your doctor know that you have met your goals.   Goals Addressed            This Visit's Progress   . COMPLETED: "I really want to get somewhere where people can help me" (pt-stated)       CCM RN CM received incoming call from [employer], [place] who is collaborating with [place] to assist patient/son with SNF placement. [place] states patient has offer (bed available) at [facility] and [facility]. States son has been provided options and will accept [facility] bed. Clinic SW states home visit for vital check is not needed today. CCM RN CM notified son and assessed for additional needs. No needs identified. Son is prepared to transport patient to SNF when appropriate. RN CM goals met. [place] will continue to assist patient/son until transfer complete.  Current Barriers:  Marland Kitchen Knowledge Deficits related to process for SNF admission related to COVID-19 pandemic  . Inability to self care  Nurse Case Manager Clinical Goal(s):  Marland Kitchen Over the next 4 days, patient will meet with RN Care Manager to address meeting requirment for SNF placement . Over the next 7 days, patient will not experience hospital admission. Hospital Admissions in last 6 months = 0 . Over the next 7 days, patient will work with CM clinical social worker to gain admission to SNF   . Provided emotional support and reassurance related to COVID-19 . Reviewed CDC guidelines for COVID-19 infection prevention including hand hygiene, social distancing, and symptom reporting  . Interventions:  . Collaborated with [place] Worker, [place], and PCP Dr. Enid Derry regarding plan of care for gaining admission to SNF Collaborated with CCM Team to facilitate [facility]  signage  Patient Self Care Activities:  . Currently UNABLE TO independently perform ADLs and IADLs  . Patient/caregiver will follow CDC guidelines for COVID-19 infection prevention . Patient/caregiver will willing continue to engage with CCM RN CM and [place] to gain admission to SNF  Please see past updates related to this goal by clicking on the "Past Updates" button in the selected goal

## 2019-01-18 ENCOUNTER — Ambulatory Visit: Payer: Self-pay | Admitting: *Deleted

## 2019-01-18 ENCOUNTER — Other Ambulatory Visit: Payer: Self-pay

## 2019-01-18 DIAGNOSIS — M6281 Muscle weakness (generalized): Secondary | ICD-10-CM | POA: Diagnosis not present

## 2019-01-18 DIAGNOSIS — R2681 Unsteadiness on feet: Secondary | ICD-10-CM | POA: Diagnosis not present

## 2019-01-18 DIAGNOSIS — S22030S Wedge compression fracture of third thoracic vertebra, sequela: Secondary | ICD-10-CM | POA: Diagnosis not present

## 2019-01-18 DIAGNOSIS — S22011D Stable burst fracture of first thoracic vertebra, subsequent encounter for fracture with routine healing: Secondary | ICD-10-CM | POA: Diagnosis not present

## 2019-01-18 DIAGNOSIS — M8000XD Age-related osteoporosis with current pathological fracture, unspecified site, subsequent encounter for fracture with routine healing: Secondary | ICD-10-CM | POA: Diagnosis not present

## 2019-01-18 DIAGNOSIS — G4700 Insomnia, unspecified: Secondary | ICD-10-CM | POA: Diagnosis not present

## 2019-01-18 DIAGNOSIS — G20 Parkinson's disease: Secondary | ICD-10-CM | POA: Diagnosis not present

## 2019-01-18 DIAGNOSIS — I10 Essential (primary) hypertension: Secondary | ICD-10-CM | POA: Diagnosis not present

## 2019-01-18 DIAGNOSIS — M810 Age-related osteoporosis without current pathological fracture: Secondary | ICD-10-CM | POA: Diagnosis not present

## 2019-01-18 DIAGNOSIS — R531 Weakness: Secondary | ICD-10-CM | POA: Diagnosis not present

## 2019-01-18 DIAGNOSIS — R296 Repeated falls: Secondary | ICD-10-CM | POA: Diagnosis not present

## 2019-01-18 NOTE — Patient Outreach (Addendum)
[facility] [facility]) Care Management  01/18/2019  Kristina Simmons 05/10/1934 [id number]    TELEPHONE SCREENING Referral date: 01/18/19 Referral source: Utilization management Referral reason: Requesting nursing facility placement Insurance: health team advantage  Telephone call to patient regarding utilization management referral. HIPAA verified with patient.  Patient states she is currently in [facility] care skilled nursing facility . Patient states she will be residing at the [facility] care facility for now. Patient denies any further needs at this time.   Per chart review, patient assisted by [facility] social worker for placement.  PLAN: RNCM will close patient due to patient being assessed and having no further needs.  RNCM will send patients primary MD closure letter  (no closure letter needed)  Quinn Plowman RN,BSN,CCM [facility] Telephonic  [phone]

## 2019-01-18 NOTE — Chronic Care Management (AMB) (Signed)
  Chronic Care Management   Social Work Note  01/18/2019 Name: Kristina Simmons MRN: [id number] DOB: [birth year]  Kristina Simmons is a 85 y.o. year old female who sees Lada, Satira Anis, MD for primary care. The CCM team was consulted for assistance with Level of Care Concerns.   Goals Addressed   None    Phone call to Mardene Celeste in admissions at [facility] to confirm that she received the updated FL2 . She has also spoken with the patient's son who confirmed that patient will admit to the facility today, a specific time was indicated  Follow Up Plan: Client will follow up with this social worker post discharge from the SNF as needed.  Elliot Gurney, [place] Administrator, [facility] Center/[facility] Care Management [phone]

## 2019-01-18 NOTE — Telephone Encounter (Signed)
This was taken care of and patient's needs are met

## 2019-01-22 ENCOUNTER — Other Ambulatory Visit: Payer: Self-pay | Admitting: *Deleted

## 2019-01-22 NOTE — Patient Outreach (Addendum)
[place] [facility]) Care Management  01/22/2019  Kristina Simmons 06-18-1934 [id number]  Kristina Simmons is active with [facility] Management program. She is being followed by [place] LCSW. [facility] embedded practice RNCM and LCSW worked very closely with Kristina Simmons in getting her admitted to SNF from home.  Reviewed [facility] LCSW notes. Appears patient's son Kristina Simmons is looking into possible LTC or ALF pending patient's progress at [facility] and financials.   Sent notification to [place] and facility discharge planner to make aware of above notes.  Will continue to follow along while Kristina Simmons is at [employer]. Will continue to collaborate with facility discharge planner and [facility] UM.   Marthenia Rolling, MSN-Ed, RN,BSN [place] Acute Care Coordinator [phone]

## 2019-01-22 NOTE — Patient Outreach (Signed)
[facility] [facility]) Care Management  01/22/2019  EMRI SAMPLE [birth year] [id number]   Patient was accepted by admissions at [facility] on 01/18/19, patient's son [name] states that he hopes that she will be able to stay long term or be able to transition to ALF, depending on how well she does with rehab. [name] states that he has been talking with a Ladona Ridgel with [facility] about [employer] about getting a loan to pay for her stay until he is able to sell her house so they do not have to apply for Medicaid. CSW will continue to follow patient until a more definite plan is in place. CSW will follow-up with patient's son in 2 weeks but encouraged him to call before then if needed.    Raynaldo Opitz, LCSW [facility]  Clinical Social Worker cell #: [phone]

## 2019-01-25 ENCOUNTER — Other Ambulatory Visit: Payer: Self-pay | Admitting: *Deleted

## 2019-01-25 DIAGNOSIS — G20 Parkinson's disease: Secondary | ICD-10-CM | POA: Diagnosis not present

## 2019-01-25 DIAGNOSIS — M8000XD Age-related osteoporosis with current pathological fracture, unspecified site, subsequent encounter for fracture with routine healing: Secondary | ICD-10-CM | POA: Diagnosis not present

## 2019-01-25 DIAGNOSIS — R531 Weakness: Secondary | ICD-10-CM | POA: Diagnosis not present

## 2019-01-25 DIAGNOSIS — S22011D Stable burst fracture of first thoracic vertebra, subsequent encounter for fracture with routine healing: Secondary | ICD-10-CM | POA: Diagnosis not present

## 2019-01-25 NOTE — Patient Outreach (Signed)
[facility]) Care Management  01/25/2019  ELANDRA POWELL 1933/12/26 [id number]    Telephonic collaboration with [place] and [facility] team during IDT meeting.  Member remains at [facility] SNF receiving therapy. According to discharge planner at facility, member's son as applied for Medicaid. It is too early to tell what the disposition plan will be. Member does live alone and son is open to ALF or LTC pending needs.   Will continue to follow and collaborate with facility and [facility] UM.  Will update [facility] Care Management team as needed.   Marthenia Rolling, MSN-Ed, RN,BSN [place] Acute Care Coordinator [phone]

## 2019-02-01 ENCOUNTER — Other Ambulatory Visit: Payer: Self-pay | Admitting: *Deleted

## 2019-02-01 NOTE — Patient Outreach (Signed)
[name]) Care Management  02/01/2019  Kristina Simmons [birth year] [id number]   CSW received call from patient's son, [name] regarding discharge planning for his mother who is currently at [facility] for rehab. Patient is reportedly doing well and will likely qualify for Assisted Living at discharge rather than long term care at [facility]. Son states that he did speak with an elderlaw attorney, Florentina Jenny who directed him to Glade Lloyd at [facility] to assist with finding placement. CSW had sent patient's son a list of facilities and encouraged him to go ahead & complete the Medicaid application online for ALF. [name] states that he had been talking with Ladona Ridgel with [facility] about [employer] which essentially is a loan for pay for placement until he is able to sell her home, rather than it going to [facility] which would likely greater her options for ALF placement. CSW encouraged patient's son to call with any further questions, but will check back in 3 weeks.    Raynaldo Opitz, LCSW [facility]  Clinical Social Worker cell #: [phone]

## 2019-02-01 NOTE — Patient Outreach (Signed)
[facility]) Care Management  02/01/2019  Kristina Simmons 09/21/1934 [id number]  Member discussed in collaborative telephonic IDT meeting today with facility staff and [facility] UM.   [name] remains at [facility] receiving therapy.   Facility reports discharge plan is for ALF. [name]'s son is working with an agency in finding an ALF for member. Unsure of discharge date at this time.   Will plan to sign off once member discharges to ALF. Notification sent to [place] LCSW to make aware of the above notes. Will update [facility] embedded practice team once member discharges from SNF.  Will continue to follow along until discharge. Will continue to collaborate with [facility] UM and facility staff.   Marthenia Rolling, MSN-Ed, RN,BSN [place] Acute Care Coordinator [phone]

## 2019-02-02 ENCOUNTER — Telehealth: Payer: Self-pay

## 2019-02-02 NOTE — Telephone Encounter (Signed)
I'm sorry to hear that she had a fall; I did not know anything about a new fall She is in a SNF so that provider should be managing her care  I called the son; no new fall She is already there at the SNF, January 18, 2019 This facility is just a rehab and skilled nursing facility She is rehabilitated enough that she is assisted living criteria at this point She will need to get qualified for medicaid because she has not long-term insurance She is going to be classified as assisted living level She has incontinence issues Call was dropped, "all circuits are busy" I told him before we were disconnected that this is best managed between [place], [place], and the social worker and attending at her current level of care I'm going to pass this message along to LCSW and ask her to contact son

## 2019-02-02 NOTE — Telephone Encounter (Signed)
Copied from [place] [id number]. Topic: General - Inquiry >> Feb 02, 2019 11:29 AM Richardo Priest, NT wrote: Reason for CRM: [name] calling and wanting to speak with Dr.Lada in concerns with his mother and what the next step would be for her care, due to the fall. [name] would like to discuss the possibility of going to a nursing facility. Requesting a call back at [phone] okay to leave a VM.

## 2019-02-05 ENCOUNTER — Ambulatory Visit: Payer: Self-pay | Admitting: *Deleted

## 2019-02-08 ENCOUNTER — Other Ambulatory Visit: Payer: Self-pay | Admitting: *Deleted

## 2019-02-08 DIAGNOSIS — G20 Parkinson's disease: Secondary | ICD-10-CM | POA: Diagnosis not present

## 2019-02-08 DIAGNOSIS — M6281 Muscle weakness (generalized): Secondary | ICD-10-CM | POA: Diagnosis not present

## 2019-02-08 DIAGNOSIS — G4700 Insomnia, unspecified: Secondary | ICD-10-CM | POA: Diagnosis not present

## 2019-02-08 DIAGNOSIS — I10 Essential (primary) hypertension: Secondary | ICD-10-CM | POA: Diagnosis not present

## 2019-02-08 NOTE — Patient Outreach (Signed)
[facility]) Care Management  02/08/2019  ALINE WESCHE 1934/04/04 [id number]    Telephonic IDT team meeting with [facility] UM and facility staff. Member remains at [facility]. Per facility discharge planner, member will discharge to [facility] on tomorrow 02/09/19.  Will alert [facility] Care Management team once discharge is confirmed.     Marthenia Rolling, MSN-Ed, RN,BSN [place] Acute Care Coordinator [phone]

## 2019-02-09 ENCOUNTER — Other Ambulatory Visit: Payer: Self-pay | Admitting: *Deleted

## 2019-02-09 ENCOUNTER — Telehealth: Payer: Self-pay | Admitting: *Deleted

## 2019-02-09 DIAGNOSIS — G20 Parkinson's disease: Secondary | ICD-10-CM | POA: Diagnosis not present

## 2019-02-09 DIAGNOSIS — M6281 Muscle weakness (generalized): Secondary | ICD-10-CM | POA: Diagnosis not present

## 2019-02-09 DIAGNOSIS — I10 Essential (primary) hypertension: Secondary | ICD-10-CM | POA: Diagnosis not present

## 2019-02-09 DIAGNOSIS — R296 Repeated falls: Secondary | ICD-10-CM | POA: Diagnosis not present

## 2019-02-09 NOTE — Patient Outreach (Signed)
[facility]) Care Management  02/09/2019  Kristina Simmons Mar 11, 1934 [id number]   Per Patient Pearletha For[place], as previously planned, Mrs. Hughson discharged on today 02/09/19.  Facility reported Mrs. Schupp's disposition plan was to discharge to [facility] AL[facility].  Writer will make [place] Management team aware.  Plan to sign off.  Marthenia Rolling, MSN-Ed, RN,BSN [place] Acute Care Coordinator [phone]

## 2019-02-09 NOTE — Patient Outreach (Signed)
[facility]) Care Management  02/09/2019  RAELEY GILMORE [birth year] [id number]   CSW was made aware by [facility] RNCM, Orrin Brigham that patient discharged from [facility] to [facility] today, 02/09/2019. CSW closing case as patient has long term placement now.    Raynaldo Opitz, LCSW [facility]  Clinical Social Worker cell #: [phone]

## 2019-02-13 DIAGNOSIS — R296 Repeated falls: Secondary | ICD-10-CM | POA: Diagnosis not present

## 2019-02-13 DIAGNOSIS — M8008XD Age-related osteoporosis with current pathological fracture, vertebra(e), subsequent encounter for fracture with routine healing: Secondary | ICD-10-CM | POA: Diagnosis not present

## 2019-02-13 DIAGNOSIS — G20 Parkinson's disease: Secondary | ICD-10-CM | POA: Diagnosis not present

## 2019-02-13 DIAGNOSIS — I10 Essential (primary) hypertension: Secondary | ICD-10-CM | POA: Diagnosis not present

## 2019-02-13 DIAGNOSIS — Z853 Personal history of malignant neoplasm of breast: Secondary | ICD-10-CM | POA: Diagnosis not present

## 2019-02-13 DIAGNOSIS — Z9181 History of falling: Secondary | ICD-10-CM | POA: Diagnosis not present

## 2019-02-14 ENCOUNTER — Telehealth: Payer: Self-pay

## 2019-02-14 NOTE — Telephone Encounter (Signed)
Approved. Please provide verbal order.  

## 2019-02-14 NOTE — Telephone Encounter (Signed)
Copied from [place] [id number]. Topic: Quick Communication - Home Health Verbal Orders >> Feb 13, 2019  3:39 PM Virl Axe D wrote: Caller/Agency: Amy / [place] Number: [phone] / No VM Requesting OT/PT/Skilled Nursing/Social Work/Speech Therapy: PT Frequency: 2 week 4 / 1 week 2

## 2019-02-19 ENCOUNTER — Telehealth: Payer: Self-pay | Admitting: Family Medicine

## 2019-02-19 DIAGNOSIS — R296 Repeated falls: Secondary | ICD-10-CM | POA: Diagnosis not present

## 2019-02-19 DIAGNOSIS — Z853 Personal history of malignant neoplasm of breast: Secondary | ICD-10-CM | POA: Diagnosis not present

## 2019-02-19 DIAGNOSIS — I10 Essential (primary) hypertension: Secondary | ICD-10-CM | POA: Diagnosis not present

## 2019-02-19 DIAGNOSIS — M8008XD Age-related osteoporosis with current pathological fracture, vertebra(e), subsequent encounter for fracture with routine healing: Secondary | ICD-10-CM | POA: Diagnosis not present

## 2019-02-19 DIAGNOSIS — Z9181 History of falling: Secondary | ICD-10-CM | POA: Diagnosis not present

## 2019-02-19 DIAGNOSIS — G20 Parkinson's disease: Secondary | ICD-10-CM | POA: Diagnosis not present

## 2019-02-19 NOTE — Telephone Encounter (Signed)
notified

## 2019-02-19 NOTE — Telephone Encounter (Signed)
Copied from [place] [id number]. Topic: Quick Communication - Home Health Verbal Orders >> Feb 19, 2019  2:05 PM Percell Belt A wrote: Caller/Agency: Lorenza Chick with [place] Number: [phone] Requesting OT/PT/Skilled Nursing/Social Work/Speech Therapy: Need verbals for 1 more social worker visit for community resources.

## 2019-02-19 NOTE — Telephone Encounter (Signed)
Please approve verbal orders

## 2019-02-20 ENCOUNTER — Telehealth: Payer: Self-pay | Admitting: Family Medicine

## 2019-02-20 NOTE — Telephone Encounter (Signed)
Verbal orders given  

## 2019-02-20 NOTE — Telephone Encounter (Signed)
Copied from [place] [phone]. Topic: Quick Communication - Home Health Verbal Orders >> Feb 20, 2019  1:09 PM Nils Flack wrote: Caller/Agency: Mathis Bud Number: [phone] Requesting OT/PT/Skilled Nursing/Social Work/Speech Therapy: OT  Frequency: 1 week 1 2 week4 1 week 2

## 2019-02-20 NOTE — Telephone Encounter (Signed)
Accepted. Please provide verbal order.

## 2019-02-20 NOTE — Telephone Encounter (Signed)
Verbals given  

## 2019-02-22 ENCOUNTER — Ambulatory Visit: Payer: PPO | Admitting: Nurse Practitioner

## 2019-02-22 ENCOUNTER — Ambulatory Visit: Payer: Self-pay | Admitting: *Deleted

## 2019-02-23 ENCOUNTER — Telehealth: Payer: Self-pay | Admitting: Family Medicine

## 2019-02-23 NOTE — Telephone Encounter (Signed)
Copied from [place] [id number]. Topic: Quick Communication - Home Health Verbal Orders >> Feb 23, 2019  1:28 PM Gustavus Messing wrote: Caller/Agency: Shelly Bombard Number: [phone] Lorenza Chick Requesting OT/PT/Skilled Nursing/Social Work/Speech Therapy: Social Work Frequency: 1 time next week

## 2019-02-23 NOTE — Telephone Encounter (Signed)
Approved, please provide verbal order.  

## 2019-02-23 NOTE — Telephone Encounter (Signed)
Verbal order has been left on the voicemail granting permission to proceed as necessary with orders.

## 2019-02-26 ENCOUNTER — Ambulatory Visit: Payer: Self-pay | Admitting: *Deleted

## 2019-02-26 ENCOUNTER — Other Ambulatory Visit: Payer: Self-pay

## 2019-02-26 ENCOUNTER — Ambulatory Visit (INDEPENDENT_AMBULATORY_CARE_PROVIDER_SITE_OTHER): Payer: PPO | Admitting: Nurse Practitioner

## 2019-02-26 ENCOUNTER — Encounter: Payer: Self-pay | Admitting: Nurse Practitioner

## 2019-02-26 DIAGNOSIS — R413 Other amnesia: Secondary | ICD-10-CM | POA: Diagnosis not present

## 2019-02-26 DIAGNOSIS — G20 Parkinson's disease: Secondary | ICD-10-CM

## 2019-02-26 DIAGNOSIS — S22030S Wedge compression fracture of third thoracic vertebra, sequela: Secondary | ICD-10-CM

## 2019-02-26 DIAGNOSIS — F331 Major depressive disorder, recurrent, moderate: Secondary | ICD-10-CM | POA: Diagnosis not present

## 2019-02-26 DIAGNOSIS — R2681 Unsteadiness on feet: Secondary | ICD-10-CM

## 2019-02-26 DIAGNOSIS — R5383 Other fatigue: Secondary | ICD-10-CM

## 2019-02-26 DIAGNOSIS — S42201D Unspecified fracture of upper end of right humerus, subsequent encounter for fracture with routine healing: Secondary | ICD-10-CM

## 2019-02-26 DIAGNOSIS — R5382 Chronic fatigue, unspecified: Secondary | ICD-10-CM | POA: Diagnosis not present

## 2019-02-26 MED ORDER — ESCITALOPRAM OXALATE 5 MG PO TABS: 5.0000 mg | ORAL_TABLET | Freq: Every day | ORAL | 0 refills | Status: DC

## 2019-02-26 NOTE — Progress Notes (Signed)
Virtual Visit via Video Note  I connected with Kristina Simmons on 02/26/19 at  9:40 AM EDT by a video enabled telemedicine application and verified that I am speaking with the correct person using two identifiers.   Staff discussed the limitations of evaluation and management by telemedicine and the availability of in person appointments. The patient expressed understanding and agreed to proceed.  Patient location: home  My location: work office Other people present: Son, Dorreen Valiente  HPI  She got discharged from [facility], son has had to stay with her every night. The plan at [facility] was for her to do rehab and transition to nursing home, however she only qualified for assisted living which will not be covered by her insurance. Son did complete medicare form that nursing home is sending on her behalf. She is presently having home physical therapy 2 days a week. States she is unsteady on her feet and has some confusion at night time.  This morning she remembered the call and got ready for it. It has been hard for her since her husband has passed away.   Patient states she is forgetting current events, having difficulty remembering day and time, forgetting what she is doing when she is walking into a certain room. Puts things down and cant find them. States usually she is good at remembering thing and this is a change in her baseline over the past couple of months.  6CIT Screen 02/26/2019 11/30/2018  What Year? 0 points 0 points  What month? 0 points 0 points  What time? 0 points 0 points  Count back from 20 0 points 0 points  Months in reverse 0 points 0 points  Repeat phrase 0 points 0 points  Total Score 0 0     PHQ2/9: Depression screen [facility] 2/9 02/26/2019 01/12/2019 11/30/2018 07/19/2018 07/19/2018  Decreased Interest 1 3 0 0 0  Down, Depressed, Hopeless 1 3 0 0 0  PHQ - 2 Score 2 6 0 0 0  Altered sleeping 3 3 - 3 -  Tired, decreased energy 3 3 - 3 -  Change  in appetite 3 3 - 3 -  Feeling bad or failure about yourself  1 3 - 3 -  Trouble concentrating 3 3 - 3 -  Moving slowly or fidgety/restless 0 3 - 0 -  Suicidal thoughts 0 3 - 0 -  PHQ-9 Score 15 27 - 15 -  Difficult doing work/chores Somewhat difficult Extremely dIfficult - Somewhat difficult -     PHQ reviewed. Positive  Patient Active Problem List   Diagnosis Date Noted  . Compression fracture of T3 vertebra ([place]) 01/15/2019  . Osteoporosis 08/23/2018  . History of ductal carcinoma in situ (DCIS) of breast 08/18/2018  . Atypical lobular hyperplasia [facility]) of right breast 07/31/2018  . Parkinsonian tremor ([place]) 07/19/2018  . Fatigue 10/27/2017  . Visual disturbance 10/27/2017  . Vitamin B12 deficiency 10/13/2017  . Unstable gait 09/01/2017  . Medication monitoring encounter 09/01/2017  . Small vessel disease, cerebrovascular 09/01/2017  . Cervical spine degeneration 09/01/2017  . Encounter for completion of form with patient 09/01/2017  . History of left breast cancer 09/01/2017  . Neoplasm of uncertain behavior of skin 08/12/2016  . Heartburn 07/27/2016  . Vitamin D deficiency 07/27/2016  . Benign hypertension 07/22/2016  . Humerus fracture 03/01/2016    Past Medical History:  Diagnosis Date  . Arthritis   . Breast cancer ([place]) 2010   DCIS; diagnosed on January 15, 2009.  Left   . Breast screening, unspecified   . Bursitis    left hip  . Fall   . Fibromyalgia 2011  . Humerus fracture Mar 01, 2016   left  . Kidney stones   . Osteoporosis 08/23/2018  . Personal history of malignant neoplasm of breast 2010   left breast cancer; status post wide excision with mastoplasty followed by whole breast radiation and tamoxifen  . Personal history of radiation therapy 2010   BREAST CA  . Sleep apnea 2006  . Special screening for malignant neoplasms, colon   . Tremor 2011  . Undiagnosed cardiac murmurs   . Unspecified essential hypertension     Past Surgical History:   Procedure Laterality Date  . ABDOMINAL HYSTERECTOMY  1974  . APPENDECTOMY  1974  . BREAST BIOPSY Right 07/10/2018   RIGHT Affirm Bx ("X" clip) path pending  . BREAST EXCISIONAL BIOPSY Left 2010   breast cancer  . BREAST SURGERY Left 2010   left breast wide excision mastoplasty  . COLONOSCOPY  2004  . LITHOTRIPSY  2008  . status post radiation therapy Left    breast cancer  . UPPER GI ENDOSCOPY  May 28, 2010   Biopsy showed evidence of mild chronic gastritis. There was no evidence of H. Pylori infection. A normal duodenum was reported.     Social History   Tobacco Use  . Smoking status: Never Smoker  . Smokeless tobacco: Never Used  Substance Use Topics  . Alcohol use: No     Current Outpatient Medications:  .  acetaminophen (TYLENOL) 500 MG tablet, Take 1 tablet (500 mg total) by mouth every 6 (six) hours as needed., Disp: 30 tablet, Rfl: 0 .  calcium carbonate (OS-CAL - DOSED IN MG OF ELEMENTAL CALCIUM) 1250 (500 Ca) MG tablet, Take 1 tablet (500 mg of elemental calcium total) by mouth daily., Disp: , Rfl:  .  Cholecalciferol (VITAMIN D-1000 MAX ST) 1000 units tablet, Take 1,000 Units by mouth daily., Disp: , Rfl:  .  metoprolol succinate (TOPROL-XL) 25 MG 24 hr tablet, Take 1 tablet (25 mg total) by mouth daily., Disp: 90 tablet, Rfl: 3 .  traMADol (ULTRAM) 50 MG tablet, Take 1 tablet (50 mg total) by mouth every 6 (six) hours as needed for moderate pain or severe pain., Disp: , Rfl:  .  vitamin B-12 (CYANOCOBALAMIN) 1000 MCG tablet, Take 1,000 mcg by mouth daily., Disp: , Rfl:   No Known Allergies  ROS   No other specific complaints in a complete review of systems (except as listed in HPI above).  Objective  There were no vitals filed for this visit.   There is no height or weight on file to calculate BMI.  Nursing Note and Vital Signs reviewed.  Physical Exam   Constitutional: Patient appears well-developed and well-nourished. No distress.  HENT: Head:  Normocephalic and atraumatic. Pulmonary/Chest: Effort normal  Neurological: alert and oriented, speech normal but responses are delayed.  Skin: No rash noted. No erythema.  Psychiatric: Patient has a normal mood and affect. behavior is normal. Judgment and thought content normal.    Assessment & Plan  1. Chronic fatigue Will add on home health orders, discussed nutrition, reached out to [place] work to assist with placement.  2. Compression fracture of T3 vertebra, sequela Pain controlled,   3. Parkinsonian tremor ([place]) - Ambulatory referral to Neurology  4. Unstable gait Continue with physical therpy   5. Memory impairment - Ambulatory referral to Neurology  6.  Moderate episode of recurrent major depressive disorder (HCC) - escitalopram (LEXAPRO) 5 MG tablet; Take 1 tablet (5 mg total) by mouth daily.  Dispense: 90 tablet; Refill: 0     Follow Up Instructions:  2 week follow up   I discussed the assessment and treatment plan with the patient. The patient was provided an opportunity to ask questions and all were answered. The patient agreed with the plan and demonstrated an understanding of the instructions.   The patient was advised to call back or seek an in-person evaluation if the symptoms worsen or if the condition fails to improve as anticipated.  I provided 27 minutes of non-face-to-face time during this encounter.   Fredderick Severance, NP

## 2019-02-26 NOTE — Chronic Care Management (AMB) (Signed)
     Chronic Care Management          Clinical Social Work General Note  02/26/2019 Name: Kristina Simmons MRN: [id number] DOB: 12-21-1933  Kristina Simmons is a 85 y.o. year old female who is a primary care patient of Lada, Satira Anis, MD. The CCM was consulted to assist the patient with Level of Care Concerns   Review of patient status, including review of consultants reports, relevant laboratory and other test results, and collaboration with appropriate care team members and the patient's provider was performed as part of comprehensive patient evaluation and provision of chronic care management services.    Goals Addressed            This Visit's Progress   . ":I feel that my mother needs a higher level of care (pt-stated)       This social worker spoke to patient's son today to discuss patient's level of care concerns. Per patient's son, patient discharged from skilled care and was not transferred to the Assisted Living as planned due to the fact that she was not financially eligible. [name] reports that the Medicaid application was submitted but there has been no follow up. Per patient's son, he now feels that patient needs a higher level of care. He went on to describe patient as having very little energy, a tremor in her right hand and being unable to take care of her ADL's and IADL's. He currently visits daily but he feels patient needs more care and supervision. Patient currently receives [facility] thorugh [place], but this service will be coming to an end soon   Current Barriers:  . Financial constraints . Level of care concerns . ADL IADL limitations  Clinical Social Work Clinical Goal(s):  Marland Kitchen Over the next 30 days, social work will work with patient's son to address concerns related to level of care needs  Interventions: . Provided patient;s son with information about long term care options for patient . Discussed plans with patient's son for ongoing care  management follow up and provided patient with direct contact information for care management team . Advised patient's son  to consider a higher level of care based on patient's current care needs . Collaborated with [facility]  (community agency) re: patient's Medicaid status, possible in home personal care and  long term care options  Patient Self Care Activities:  . Attends all scheduled provider appointments . Calls provider office for new concerns or questions  Initial goal documentation         Follow Up Plan: SW will follow up with patient's son by phone over the next 2 weeks to discuss in home care options, Mediciad status and long term care needs       Elliot Gurney, [place] Worker  [facility] Care Management [phone]

## 2019-02-26 NOTE — Patient Instructions (Signed)
Thank you allowing the Chronic Care Management Team to be a part of your care! It was a pleasure speaking with you today!  1. Please continue to consider arranging in home care until long term care can be arranged 2. Please contact this social workers with any questions or concerns       CCM (Chronic Care Management) Team   Trish Fountain RN, BSN Nurse Care Coordinator  [phone]  Ruben Reason PharmD  Clinical Pharmacist  [phone]   Elliot Gurney, LCSW Clinical Social Worker [phone]  Goals Addressed            This Visit's Progress   . ":I feel that my mother needs a higher level of care (pt-stated)       Current Barriers:  . Financial constraints . Level of care concerns . ADL IADL limitations  Clinical Social Work Clinical Goal(s):  Marland Kitchen Over the next 30 days, social work will work with patient's son to address concerns related to level of care needs  Interventions: . Provided patient;s son with information about long term care options for patient . Discussed plans with patient's son for ongoing care management follow up and provided patient with direct contact information for care management team . Advised patient's son  to consider a higher level of care based on patient's current care needs . Collaborated with [facility]  (community agency) re: patient's Medicaid status, possible in home personal care and  long term care options  Patient Self Care Activities:  . Attends all scheduled provider appointments . Calls provider office for new concerns or questions  Initial goal documentation         The patient verbalized understanding of instructions provided today and declined a print copy of patient instruction materials.   The CM team will reach out to  patient's again over the next 14 days.

## 2019-02-26 NOTE — Chronic Care Management (AMB) (Signed)
  Chronic Care Management    Clinical Social Work Follow Up Note  02/26/2019 Name: Kristina Simmons MRN: [id number] DOB: July 14, 1934  Kristina Simmons is a 85 y.o. year old female who is a primary care patient of Lada, Satira Anis, MD. The CCM team was consulted for assistance with Level of Care Concerns.   Review of patient status, including review of consultants reports, other relevant assessments, and collaboration with appropriate care team members and the patient's provider was performed as part of comprehensive patient evaluation and provision of chronic care management services.     Goals Addressed            This Visit's Progress   . ":I feel that my mother needs a higher level of care (pt-stated)       Current Barriers:  . Financial constraints . Level of care concerns . ADL IADL limitations  Clinical Social Work Clinical Goal(s):  Marland Kitchen Over the next 30 days, social work will work with patient's son to address concerns related to level of care needs  Interventions: . Collaborated with [facility]  (community agency) re: patient's Medicaid status, possible in home personal care and  long term care options  Patient Self Care Activities:  . Attends all scheduled provider appointments . Calls provider office for new concerns or questions  Please see past updates related to this goal by clicking on the "Past Updates" button in the selected goal          Follow Up Plan: SW will follow up with patient's son and [facility] social worker  by phone over the next 7 days   Elliot Gurney, [place] Worker  [facility] Care Management [phone]

## 2019-02-27 ENCOUNTER — Ambulatory Visit: Payer: Self-pay | Admitting: *Deleted

## 2019-02-27 DIAGNOSIS — R5382 Chronic fatigue, unspecified: Secondary | ICD-10-CM

## 2019-02-27 DIAGNOSIS — R413 Other amnesia: Secondary | ICD-10-CM

## 2019-02-27 NOTE — Chronic Care Management (AMB) (Signed)
  Chronic Care Management   Social Work Note  5/12/2020Name: CIARAH PEACE MRN: [id number] DOB: September 02, 1934  Dalina MEILAH DELROSARIO is a 85 y.o. year old female who sees Lada, Satira Anis, MD for primary care. The CCM team was consulted for assistance with Level of Care Concerns.  Phone call to the Department of Social Services to follow up on the status of patient's long term care medicaid application. Per Ms. McBroom, patient's Medicaid application process was started on 02/09/2019 and the case was transferred to [facility].  Sherita Paylor contacted who confirmed that she is the assigned worker and despite the fact that patient resides in [place], she will continue to assist patient's son with completing the application process since it was started in [place]. Per Idalia Needle, the application is on the 26 day of the 45 day process and has agreed to contact patient's son regarding the additional paperwork needed to complete the process.   Goals Addressed   None     Follow Up Plan: SW will follow up with patient by phone over the next 2 weeks regariding the long term medicaid aplication process and likely placement in a long term care facility  [place], [place] Worker  [facility] Care Management [phone]

## 2019-02-27 NOTE — Patient Instructions (Signed)
Thank you allowing the Chronic Care Management Team to be a part of your care! It was a pleasure speaking with you today!  1. Please continue to work with Medicaid to complete the long term care application process.  CCM (Chronic Care Management) Team   Trish Fountain RN, BSN Nurse Care Coordinator  [phone]  Ruben Reason PharmD  Clinical Pharmacist  [phone]   Elliot Gurney, LCSW Clinical Social Worker [phone]  Goals Addressed            This Visit's Progress   . ":I feel that my mother needs a higher level of care (pt-stated)       Current Barriers:  . Financial constraints . Level of care concerns . ADL IADL limitations  Clinical Social Work Clinical Goal(s):  Marland Kitchen Over the next 30 days, social work will work with patient's son to address concerns related to level of care needs  Interventions: . This Education officer, museum discussed with patient's son the collaboration phone calls made to the discharge planner at the [facility] rehab center and the the Department of Social Services regarding the status of patient's long term care plan and Medicaid application. . [name] confirmed receiving a phone call from the Department of Social Services-Sherita Paylor who discussed the required documents to confirm Medicaid eligibility which will  be sent in a follow up letter . [name] confirmed plan to work to complete the Long term care Medicaid application and agrees with arrangement for patient to transfer to a nursing home in [facility]  Patient Self Care Activities:  . Attends all scheduled provider appointments . Calls provider office for new concerns or questions  Please see past updates related to this goal by clicking on the "Past Updates" button in the selected goal          The patient verbalized understanding of instructions provided today and declined a print copy of patient instruction materials.   The CM team will reach out to  the patient's son again over the next 2 weeks.

## 2019-02-27 NOTE — Chronic Care Management (AMB) (Signed)
  Chronic Care Management   Social Work Note  02/27/2019 Name: [name] MRN: [id number] DOB: 03-31-1934  Kristina Simmons is a 85 y.o. year old female who sees Lada, Satira Anis, MD for primary care. The CCM team was consulted for assistance with Level of Care Concerns.   Phone call today to the discharge planner at [facility] who confirmed that the plan was for patient to discharge to [facility]. It is her understanding that as patient's son worked with the Child psychotherapist there, it was determined that patient finances did not work out and patient's son had to bring patient home with [facility] services. She further confirmed that a medicaid application was started during her stay.  Goals Addressed   None     Follow Up Plan: SW will follow up with patient's son  by phone over the next 2 weeks regarding the status of patient's medicaid application and level of care concerns   Elliot Gurney, [place] Worker  [facility] Care Management [phone]

## 2019-02-27 NOTE — Chronic Care Management (AMB) (Signed)
  Chronic Care Management    Clinical Social Work Follow Up Note  02/27/2019 Name: Kristina Simmons MRN: [id number] DOB: 15-Feb-1934  Kristina Simmons is a 85 y.o. year old female who is a primary care patient of Lada, Satira Anis, MD. The CCM team was consulted for assistance with Level of Care Concerns.   Review of patient status, including review of consultants reports, other relevant assessments, and collaboration with appropriate care team members and the patient's provider was performed as part of comprehensive patient evaluation and provision of chronic care management services.     Goals Addressed            This Visit's Progress   . ":I feel that my mother needs a higher level of care (pt-stated)       Phone call today to patient's son to discus the status of patient's long term care Medicaid application and continued plan to arrange for facility care. Per patient's son, he received a call from the Department of Social services and is working on the required documents needed to complete the process. This Education officer, museum confirmed that the [place] will be requested from patient's docto and will be faxed out for bed availabilities in [facility].  Current Barriers:  . Financial constraints . Level of care concerns . ADL IADL limitations  Clinical Social Work Clinical Goal(s):  Marland Kitchen Over the next 30 days, social work will work with patient's son to address concerns related to level of care needs  Interventions: . This Education officer, museum discussed with patient's son the collaboration phone calls made to the discharge planner at the [facility] rehab center and the the Department of Social Services regarding the status of patient's long term care plan and Medicaid application. . [name] confirmed receiving a phone call from the Department of Social Services-Sherita Paylor who discussed the required documents to confirm Medicaid eligibility which will  be sent in a follow up  letter . [name] confirmed plan to work to complete the Long term care Medicaid application and agrees with arrangement for patient to transfer to a nursing home in [facility]  Patient Self Care Activities:  . Attends all scheduled provider appointments . Calls provider office for new concerns or questions  Please see past updates related to this goal by clicking on the "Past Updates" button in the selected goal          Follow Up Plan: Client's son  will complete the long term care Medicaid applicaton process. This Education officer, museum to request the FL2 from patient's provider to begin bed search   Elliot Gurney, [place] Worker  [facility] Care Management [phone]

## 2019-03-05 ENCOUNTER — Ambulatory Visit: Payer: Self-pay | Admitting: *Deleted

## 2019-03-05 ENCOUNTER — Telehealth: Payer: Self-pay | Admitting: Family Medicine

## 2019-03-05 DIAGNOSIS — R413 Other amnesia: Secondary | ICD-10-CM

## 2019-03-05 DIAGNOSIS — R5382 Chronic fatigue, unspecified: Secondary | ICD-10-CM

## 2019-03-05 NOTE — Telephone Encounter (Signed)
Copied from [place] [id number]. Topic: Quick Communication - Home Health Verbal Orders >> Mar 05, 2019  2:46 PM [place] N[place], [place] wrote: Caller/Agency: Marni Griffon Number: [phone]  Requesting OT/PT/Skilled Nursing/Social Work/Speech Therapy: Physical therapy Frequency: Missed therapy visit on Friday 03/02/2019 Requesting to move the visit/extend for the following. 1x1 1x1 (week of June 1st)

## 2019-03-05 NOTE — Telephone Encounter (Signed)
Please approve this schedule change/extension.

## 2019-03-05 NOTE — Chronic Care Management (AMB) (Signed)
  Chronic Care Management   Social Work Note  03/05/2019 Name: Kristina Simmons MRN: [id number] DOB: 1934/09/15  Kristina Simmons is a 85 y.o. year old female who sees Lada, Satira Anis, MD for primary care. The CCM team was consulted for assistance with Level of Care Concerns.   Blank Fl2 faxed to patient's providers office to be completed to begin bed search for long term care for patient.  Goals Addressed   None     Follow Up Plan: SW will follow up with patient's son by phone over the next 2 weeks in regards to facility care options.  Elliot Gurney, [place] Administrator, [facility] [phone]

## 2019-03-06 ENCOUNTER — Telehealth: Payer: Self-pay

## 2019-03-06 NOTE — Telephone Encounter (Signed)
Verbal given 

## 2019-03-08 ENCOUNTER — Ambulatory Visit: Payer: Self-pay | Admitting: *Deleted

## 2019-03-08 DIAGNOSIS — R296 Repeated falls: Secondary | ICD-10-CM

## 2019-03-08 DIAGNOSIS — R413 Other amnesia: Secondary | ICD-10-CM

## 2019-03-08 NOTE — Chronic Care Management (AMB) (Signed)
  Chronic Care Management   Social Work Note  03/08/2019 Name: Kristina Simmons MRN: [id number] DOB: 1934/05/12  Kristina Simmons is a 85 y.o. year old female who sees Lada, Satira Anis, MD for primary care. The CCM team was consulted for assistance with Level of Care Concerns.   Fl2 faxed to patient's provider's office to be completed to begin bed search for long term care for patient. This social worker was not aware that the  first [name] faxed did not go through.   Goals Addressed   None     Follow Up Plan: SW will follow up with patient's son by phone over the next 2 weeks  [name], [place] Worker  [facility] Care Management [phone]

## 2019-03-09 ENCOUNTER — Ambulatory Visit (INDEPENDENT_AMBULATORY_CARE_PROVIDER_SITE_OTHER): Payer: PPO | Admitting: Nurse Practitioner

## 2019-03-09 ENCOUNTER — Other Ambulatory Visit: Payer: Self-pay

## 2019-03-09 ENCOUNTER — Encounter: Payer: Self-pay | Admitting: Nurse Practitioner

## 2019-03-09 DIAGNOSIS — G20 Parkinson's disease: Secondary | ICD-10-CM

## 2019-03-09 DIAGNOSIS — I679 Cerebrovascular disease, unspecified: Secondary | ICD-10-CM | POA: Diagnosis not present

## 2019-03-09 DIAGNOSIS — R2681 Unsteadiness on feet: Secondary | ICD-10-CM | POA: Diagnosis not present

## 2019-03-09 DIAGNOSIS — M8000XS Age-related osteoporosis with current pathological fracture, unspecified site, sequela: Secondary | ICD-10-CM | POA: Diagnosis not present

## 2019-03-09 DIAGNOSIS — F321 Major depressive disorder, single episode, moderate: Secondary | ICD-10-CM

## 2019-03-09 DIAGNOSIS — R5383 Other fatigue: Secondary | ICD-10-CM

## 2019-03-09 DIAGNOSIS — I10 Essential (primary) hypertension: Secondary | ICD-10-CM

## 2019-03-09 DIAGNOSIS — R413 Other amnesia: Secondary | ICD-10-CM | POA: Insufficient documentation

## 2019-03-09 MED ORDER — ESCITALOPRAM OXALATE 10 MG PO TABS: 10.0000 mg | ORAL_TABLET | Freq: Every day | ORAL | 0 refills | Status: AC

## 2019-03-09 NOTE — Progress Notes (Signed)
Virtual Visit via Video Note  I connected with Kristina Simmons on 03/09/19 at 10:20 AM EDT by a video enabled telemedicine application and verified that I am speaking with the correct person using two identifiers.   Staff discussed the limitations of evaluation and management by telemedicine and the availability of in person appointments. The patient expressed understanding and agreed to proceed.  Patient location: home  My location: home office Other people present: Maille Halliwell  HPI  Patient had virtual visit with neurologist today due to worsening tremors and progressively declining memory impairment. Was started on primidone 50mg  BID for tremors and scheduled for memory testing in one month.   Patient is receiving in home PT to assist with slow shuffling gait coming twice a week. Has occupational therapist to start. Would benefit from home health aide/sitter as she is alone during the day time and becomes very drowsy and at times intermittently confused per son.   Fatigue States has progressively worsened. Neurology recommended sleep study, patient declined.   Hypertension Takes metoprolol 25mg  daily  Depression Started on lexapro 5mg  daily, no side effects. States feels pretty good.  Osteoporosis Takes calcium and vitamin D supplementation, compression fracture of T3 vertebrae   PHQ2/9: Depression screen [facility] 2/9 03/09/2019 02/26/2019 01/12/2019 11/30/2018 07/19/2018  Decreased Interest 1 1 3  0 0  Down, Depressed, Hopeless 1 1 3  0 0  PHQ - 2 Score 2 2 6  0 0  Altered sleeping 3 3 3  - 3  Tired, decreased energy 3 3 3  - 3  Change in appetite 3 3 3  - 3  Feeling bad or failure about yourself  1 1 3  - 3  Trouble concentrating 3 3 3  - 3  Moving slowly or fidgety/restless 1 0 3 - 0  Suicidal thoughts 0 0 3 - 0  PHQ-9 Score 16 15 27  - 15  Difficult doing work/chores Somewhat difficult Somewhat difficult Extremely dIfficult - Somewhat difficult    PHQ reviewed. Positive    Patient Active Problem List   Diagnosis Date Noted  . Compression fracture of T3 vertebra ([place]) 01/15/2019  . Osteoporosis 08/23/2018  . History of ductal carcinoma in situ (DCIS) of breast 08/18/2018  . Atypical lobular hyperplasia [facility]) of right breast 07/31/2018  . Parkinsonian tremor ([place]) 07/19/2018  . Fatigue 10/27/2017  . Visual disturbance 10/27/2017  . Vitamin B12 deficiency 10/13/2017  . Unstable gait 09/01/2017  . Medication monitoring encounter 09/01/2017  . Small vessel disease, cerebrovascular 09/01/2017  . Cervical spine degeneration 09/01/2017  . Encounter for completion of form with patient 09/01/2017  . History of left breast cancer 09/01/2017  . Neoplasm of uncertain behavior of skin 08/12/2016  . Heartburn 07/27/2016  . Vitamin D deficiency 07/27/2016  . Benign hypertension 07/22/2016  . Humerus fracture 03/01/2016    Past Medical History:  Diagnosis Date  . Arthritis   . Breast cancer ([place]) 2010   DCIS; diagnosed on January 15, 2009. Left   . Breast screening, unspecified   . Bursitis    left hip  . Fall   . Fibromyalgia 2011  . Humerus fracture Mar 01, 2016   left  . Kidney stones   . Osteoporosis 08/23/2018  . Personal history of malignant neoplasm of breast 2010   left breast cancer; status post wide excision with mastoplasty followed by whole breast radiation and tamoxifen  . Personal history of radiation therapy 2010   BREAST CA  . Sleep apnea 2006  . Special screening for malignant neoplasms, colon   .  Tremor 2011  . Undiagnosed cardiac murmurs   . Unspecified essential hypertension     Past Surgical History:  Procedure Laterality Date  . ABDOMINAL HYSTERECTOMY  1974  . APPENDECTOMY  1974  . BREAST BIOPSY Right 07/10/2018   RIGHT Affirm Bx ("X" clip) path pending  . BREAST EXCISIONAL BIOPSY Left 2010   breast cancer  . BREAST SURGERY Left 2010   left breast wide excision mastoplasty  . COLONOSCOPY  2004  . LITHOTRIPSY  2008  .  status post radiation therapy Left    breast cancer  . UPPER GI ENDOSCOPY  May 28, 2010   Biopsy showed evidence of mild chronic gastritis. There was no evidence of H. Pylori infection. A normal duodenum was reported.     Social History   Tobacco Use  . Smoking status: Never Smoker  . Smokeless tobacco: Never Used  Substance Use Topics  . Alcohol use: No     Current Outpatient Medications:  .  acetaminophen (TYLENOL) 500 MG tablet, Take 1 tablet (500 mg total) by mouth every 6 (six) hours as needed., Disp: 30 tablet, Rfl: 0 .  calcium carbonate (OS-CAL - DOSED IN MG OF ELEMENTAL CALCIUM) 1250 (500 Ca) MG tablet, Take 1 tablet (500 mg of elemental calcium total) by mouth daily., Disp: , Rfl:  .  Cholecalciferol (VITAMIN D-1000 MAX ST) 1000 units tablet, Take 1,000 Units by mouth daily., Disp: , Rfl:  .  escitalopram (LEXAPRO) 5 MG tablet, Take 1 tablet (5 mg total) by mouth daily., Disp: 90 tablet, Rfl: 0 .  metoprolol succinate (TOPROL-XL) 25 MG 24 hr tablet, Take 1 tablet (25 mg total) by mouth daily., Disp: 90 tablet, Rfl: 3 .  traMADol (ULTRAM) 50 MG tablet, Take 1 tablet (50 mg total) by mouth every 6 (six) hours as needed for moderate pain or severe pain., Disp: , Rfl:  .  vitamin B-12 (CYANOCOBALAMIN) 1000 MCG tablet, Take 1,000 mcg by mouth daily., Disp: , Rfl:   No Known Allergies  ROS   No other specific complaints in a complete review of systems (except as listed in HPI above).  Objective  There were no vitals filed for this visit.   There is no height or weight on file to calculate BMI.  Nursing Note and Vital Signs reviewed.  Physical Exam  Constitutional: Patient appears well-developed and well-nourished. No distress.  HENT: Head: Normocephalic and atraumatic. Pulmonary/Chest: Effort normal  Musculoskeletal: Normal range of motion,  Neurological: alert and oriented, speech normal.  Skin: No rash noted. No erythema.  Psychiatric: Patient has a normal  mood and affect. behavior is normal. Judgment and thought content normal.    Assessment & Plan  1. Small vessel disease, cerebrovascular - Ambulatory referral to Home Health  2. Unstable gait In home PT and follow up with neurology  - Ambulatory referral to [place]  3. Parkinsonian tremor ([place]) Start primidone, follow up with neurology - Ambulatory referral to [place]  4. Other fatigue Declined sleep study, hydration and nutrition.  - Ambulatory referral to [place]  5. Current moderate episode of major depressive disorder without prior episode (HCC) Increase to 10mg  nightly.  - escitalopram (LEXAPRO) 10 MG tablet; Take 1 tablet (10 mg total) by mouth daily.  Dispense: 1 tablet; Refill: 0 - Ambulatory referral to Home Health  6. Osteoporosis with current pathological fracture, unspecified osteoporosis type, sequela In home PT, calcium and vitamin D supplements  - Ambulatory referral to [place]  7. Benign hypertension  Continue meds - Ambulatory referral to Home Health    Follow Up Instructions:   2 months or sooner if needed.  I discussed the assessment and treatment plan with the patient. The patient was provided an opportunity to ask questions and all were answered. The patient agreed with the plan and demonstrated an understanding of the instructions.   The patient was advised to call back or seek an in-person evaluation if the symptoms worsen or if the condition fails to improve as anticipated.  I provided 22 minutes of non-face-to-face time during this encounter.   Fredderick Severance, NP

## 2019-03-13 ENCOUNTER — Telehealth: Payer: Self-pay

## 2019-03-13 ENCOUNTER — Ambulatory Visit: Payer: Self-pay | Admitting: *Deleted

## 2019-03-13 DIAGNOSIS — R2681 Unsteadiness on feet: Secondary | ICD-10-CM

## 2019-03-13 DIAGNOSIS — G20 Parkinson's disease: Secondary | ICD-10-CM

## 2019-03-13 NOTE — Chronic Care Management (AMB) (Signed)
  Chronic Care Management   Social Work Note  03/13/2019 Name: AYSE MCCARTIN MRN: [id number] DOB: 02-27-1934  Tyson REMA LIEVANOS is a 85 y.o. year old female who sees Lada, Satira Anis, MD for primary care. The CCM team was consulted for assistance with Level of Care Concerns.   Confirmed today that patient's Fl2 has been received and in progress of being completed to be sent out for bed offers for long term care.  Goals Addressed   None     Follow Up Plan: SW will follow up with patient by phone over the next 7 days  [place], [place]/[facility] Care Management [phone]

## 2019-03-14 ENCOUNTER — Telehealth: Payer: Self-pay | Admitting: Family Medicine

## 2019-03-14 DIAGNOSIS — G4700 Insomnia, unspecified: Secondary | ICD-10-CM | POA: Diagnosis not present

## 2019-03-14 DIAGNOSIS — Z85828 Personal history of other malignant neoplasm of skin: Secondary | ICD-10-CM | POA: Diagnosis not present

## 2019-03-14 DIAGNOSIS — G20 Parkinson's disease: Secondary | ICD-10-CM | POA: Diagnosis not present

## 2019-03-14 DIAGNOSIS — Z9181 History of falling: Secondary | ICD-10-CM | POA: Diagnosis not present

## 2019-03-14 DIAGNOSIS — R32 Unspecified urinary incontinence: Secondary | ICD-10-CM | POA: Diagnosis not present

## 2019-03-14 DIAGNOSIS — I10 Essential (primary) hypertension: Secondary | ICD-10-CM | POA: Diagnosis not present

## 2019-03-14 DIAGNOSIS — M1990 Unspecified osteoarthritis, unspecified site: Secondary | ICD-10-CM | POA: Diagnosis not present

## 2019-03-14 DIAGNOSIS — Z853 Personal history of malignant neoplasm of breast: Secondary | ICD-10-CM | POA: Diagnosis not present

## 2019-03-14 DIAGNOSIS — M8008XS Age-related osteoporosis with current pathological fracture, vertebra(e), sequela: Secondary | ICD-10-CM | POA: Diagnosis not present

## 2019-03-14 DIAGNOSIS — M797 Fibromyalgia: Secondary | ICD-10-CM | POA: Diagnosis not present

## 2019-03-14 DIAGNOSIS — F321 Major depressive disorder, single episode, moderate: Secondary | ICD-10-CM | POA: Diagnosis not present

## 2019-03-14 DIAGNOSIS — E538 Deficiency of other specified B group vitamins: Secondary | ICD-10-CM | POA: Diagnosis not present

## 2019-03-14 NOTE — Telephone Encounter (Signed)
Approved for PT, OT and Social work evaluation.  Have her stop lexapro for one week and see if this improves drowsiness, if not we can restart lexapro 10mg  daily. Have family call neurology to see if primidone could be contributing and alternate dosing option if it is.

## 2019-03-14 NOTE — Telephone Encounter (Signed)
Copied from [place] [id number]. Topic: Quick Communication - Home Health Verbal Orders >> Mar 14, 2019  3:04 PM Vernona Rieger wrote: Caller/Agency: [name] , RN [facility]  Callback Number: [phone] Requesting OT/PT/Skilled Nursing/Social Work/Speech Therapy: Nursing, also she would like a Occupational Therapy, Physical Therapy and Social Work evaluation.  Frequency: 1 week 4 for Nursing  She said that she is not taking her medications as ordered.  escitalopram (LEXAPRO) 10 MG tablet ( only been taking it once a day ) and she is only taking her primidone (MYSOLINE) 50 MG tablet once a day.  She said she is very sleepy and will not stay awake while she is there. Please advise.

## 2019-03-15 ENCOUNTER — Telehealth: Payer: Self-pay | Admitting: Family Medicine

## 2019-03-15 NOTE — Telephone Encounter (Signed)
Copied from [place] [phone]. Topic: General - Other >> Mar 15, 2019  2:51 PM Keene Breath wrote: Reason for CRM: Amy with [employer] called to inform the doctor that they are discharging the patient because the insurance has denied further payment.  If there are any questions, please call at [phone]

## 2019-03-16 ENCOUNTER — Ambulatory Visit: Payer: Self-pay | Admitting: *Deleted

## 2019-03-16 ENCOUNTER — Telehealth: Payer: Self-pay | Admitting: Emergency Medicine

## 2019-03-16 DIAGNOSIS — R2681 Unsteadiness on feet: Secondary | ICD-10-CM

## 2019-03-16 DIAGNOSIS — G20 Parkinson's disease: Secondary | ICD-10-CM

## 2019-03-16 NOTE — Telephone Encounter (Signed)
Please call back and find out what this is in regards to.  We will complete FL2 form.

## 2019-03-16 NOTE — Telephone Encounter (Signed)
Patients son Remo Lipps would like to speak to you

## 2019-03-16 NOTE — Telephone Encounter (Signed)
[name] called back and verbally read out loud elizabeth message.  [name] verbalized understanding, and states thank you.  Also documented elizabeth poulose as givng orders.

## 2019-03-16 NOTE — Telephone Encounter (Signed)
Shawna with [facility] called to ask why pt has another referral to [facility]?  [facility] has not denied payment.  They may have denied visits, but not payment. The [facility] health referral is still active, and approved until June 26. Pt cannot have 2 home health referrals. [place] has been working with [facility] and saw the referral to [facility].  She is requesting a call back as soon as possible.   [facility] Call back number [phone]

## 2019-03-16 NOTE — Telephone Encounter (Signed)
Copied from [place] [phone]. Topic: General - Call Back - No Documentation >> Mar 16, 2019 10:57 AM Erick Blinks wrote: Pt's son requesting call back from [facility] when possible Best Contact: [phone]

## 2019-03-16 NOTE — Chronic Care Management (AMB) (Addendum)
  Chronic Care Management    Clinical Social Work Follow Up Note  03/16/2019 Name: Kristina Simmons MRN: [id number] DOB: Jul 16, 1934  Kristina Simmons is a 85 y.o. year old female who is a primary care patient of Lada, Satira Anis, MD. The CCM team was consulted for assistance with Level of Care Concerns.   Review of patient status, including review of consultants reports, other relevant assessments, and collaboration with appropriate care team members and the patient's provider was performed as part of comprehensive patient evaluation and provision of chronic care management services.     Goals Addressed            This Visit's Progress   . ":I feel that my mother needs a higher level of care (pt-stated)       Phone call to patient's son today to discuss status of placement and long term medicaid application. [name] discussed concerns regarding patient's safety due to recent fall when he was not there. [name] is requesting assistance with placement as soon as possible. He is currently working with the [place] regarding the long term care medicaid application process. Per patient's son, patient cannot afford to hire a in home aid at this time.  Current Barriers:  . Financial constraints . Level of care concerns . ADL IADL limitations  Clinical Social Work Clinical Goal(s):  Marland Kitchen Over the next 30 days, social work will work with patient's son to address concerns related to level of care needs  Interventions: . This Education officer, museum reviewd placement status and son's concerns regarding the time frame of patient's placement due to recent fall. . [name] confirmed receiving a packet of information from the Department of social services to complete  the long term care Medicaid application process. The information was completed and sent back.    . [name] reminded that the [facility] is needed from the providers office before the bed search can begin.  [name] reassured that a call will be made to the provider's office to follow up on the [place]. Nash Dimmer with patient's providers office who agree to work on [employer] today          . Encouraged patient's son to consider hiring a in home aid until placement can occur.  Patient Self Care Activities:  . Attends all scheduled provider appointments . Calls provider office for new concerns or questions  Please see past updates related to this goal by clicking on the "Past Updates" button in the selected goal          Follow Up Plan: SW will follow up with patient's by phone over the next 7 days   [name], [place] Worker  [place] [facility] Care Management [phone]

## 2019-03-16 NOTE — Telephone Encounter (Signed)
This looks like Kristina Simmons with [facility] needs to be put into contact with Amy with [facility].  Please call each and provide the other's number. Thanks!

## 2019-03-16 NOTE — Patient Instructions (Signed)
Thank you allowing the Chronic Care Management Team to be a part of your care! It was a pleasure speaking with you today!  1. Please continue to work with the [place] on the [place] application. 2. Please feel free to call this social worker with any questions or concerns related to the placement process.      CCM (Chronic Care Management) Team   Trish Fountain RN, BSN Nurse Care Coordinator  [phone]  Ruben Reason PharmD  Clinical Pharmacist  [phone]   Elliot Gurney, LCSW Clinical Social Worker [phone]  Goals Addressed            This Visit's Progress   . ":I feel that my mother needs a higher level of care (pt-stated)       Current Barriers:  . Financial constraints . Level of care concerns . ADL IADL limitations  Clinical Social Work Clinical Goal(s):  Marland Kitchen Over the next 30 days, social work will work with patient's son to address concerns related to level of care needs  Interventions: . This Education officer, museum reviewd placement status and son's concerns regarding the time frame of patient's placement due to recent fall. . [name] confirmed receiving a packet of information from the Department of social services to complete  the long term care Medicaid application process. The information was completed and sent back.    . [name] reminded that the [facility] is needed from the providers office before the bed search can begin. [name] reassured that a call will be made to the provider's office to follow up on the [place]. . Encouraged patient's son to consider hiring a in home aid until placement can occur.  Patient Self Care Activities:  . Attends all scheduled provider appointments . Calls provider office for new concerns or questions  Please see past updates related to this goal by clicking on the "Past Updates" button in the selected goal          The patient verbalized understanding of instructions provided  today and declined a print copy of patient instruction materials.   The care management team will reach out to the patient again over the next 14 days.

## 2019-03-16 NOTE — Telephone Encounter (Signed)
Called shawana gave phone # to amy at [facility] to contact regarding this info

## 2019-03-19 NOTE — Telephone Encounter (Signed)
Kristina Simmons working on forms and we will have Kristina Simmons call the son

## 2019-03-20 ENCOUNTER — Ambulatory Visit: Payer: Self-pay | Admitting: *Deleted

## 2019-03-20 DIAGNOSIS — R2681 Unsteadiness on feet: Secondary | ICD-10-CM

## 2019-03-20 DIAGNOSIS — G20 Parkinson's disease: Secondary | ICD-10-CM

## 2019-03-20 NOTE — Patient Instructions (Signed)
Thank you allowing the Chronic Care Management Team to be a part of your care! It was a pleasure speaking with you today!  1. Please continue to call this social worker with any questions or concerns regarding level of care concerns.     CCM (Chronic Care Management) Team   Trish Fountain RN, BSN Nurse Care Coordinator  [phone]  Ruben Reason PharmD  Clinical Pharmacist  [phone]   Elliot Gurney, LCSW Clinical Social Worker [phone]  Goals Addressed            This Visit's Progress   . ":I feel that my mother needs a higher level of care (pt-stated)       Current Barriers:  . Financial constraints . Level of care concerns . ADL IADL limitations  Clinical Social Work Clinical Goal(s):  Marland Kitchen Over the next 30 days, social work will work with patient's son to address concerns related to level of care needs  Interventions: . This Education officer, museum reviewed placement status and son's concerns regarding the time frame of patient's placement due to recent fall. . [name] confirmed receiving a packet of information from the Department of social services to complete  the long term care Medicaid application process. The information was completed and sent back. . [name] discussed need for patient to be in a facility before the Medicaid will be fully approved, according to the Department of Social Services    . [name] reminded that the [facility] is needed from the providers office before the bed search can begin. [name] reassured that a call will be made to the provider's office to follow up on the [place] regarding status of FL2 . Collaborated with patient's providers office who agreed to work on  [facility] today   Patient Self Care Activities:  . Attends all scheduled provider appointments . Calls provider office for new concerns or questions  Please see past updates related to this goal by clicking on the "Past Updates" button in the selected goal           The patient verbalized understanding of instructions provided today and declined a print copy of patient instruction materials.   The care management team will reach out to the patient again over the next 14 days.

## 2019-03-20 NOTE — Chronic Care Management (AMB) (Signed)
  Chronic Care Management    Clinical Social Work Follow Up Note  03/20/2019 Name: Kristina Simmons MRN: [id number] DOB: June 19, 1934  Kristina Simmons is a 85 y.o. year old female who is a primary care patient of Lada, Satira Anis, MD. The CCM team was consulted for assistance with Level of Care Concerns.   Review of patient status, including review of consultants reports, other relevant assessments, and collaboration with appropriate care team members and the patient's provider was performed as part of comprehensive patient evaluation and provision of chronic care management services.     Goals Addressed            This Visit's Progress   . ":I feel that my mother needs a higher level of care (pt-stated)       Phone call from patient's son today to discuss status of the paperwork needed to place his mother in a facility. [name] discussed feeling a sense of urgency for the placement process to occur due to patient's recent fall. This Education officer, museum to continue to work with the providers office to get the paper work needed to start the search for facility care for patient.   Current Barriers:  . Financial constraints . Level of care concerns . ADL IADL limitations  Clinical Social Work Clinical Goal(s):  Marland Kitchen Over the next 30 days, social work will work with patient's son to address concerns related to level of care needs  Interventions: . This Education officer, museum reviewed placement status and son's concerns regarding the time frame of patient's placement due to recent fall. . [name] confirmed receiving a packet of information from the Department of social services to complete  the long term care Medicaid application process. The information was completed and sent back. . [name] discussed need for patient to be in a facility before the Medicaid will be fully approved, according to the Department of Social Services    . [name] reminded that the [facility] is needed from the  providers office before the bed search can begin. [name] reassured that a call will be made to the provider's office to follow up on the [place] regarding status of FL2 . Collaborated with patient's providers office who agreed to work on  [facility] today   Patient Self Care Activities:  . Attends all scheduled provider appointments . Calls provider office for new concerns or questions  Please see past updates related to this goal by clicking on the "Past Updates" button in the selected goal          Follow Up Plan: SW will follow up with patient by phone over the next 2 weeks   [place], [place] Worker  [place] [facility] Care Management [phone]

## 2019-03-21 ENCOUNTER — Ambulatory Visit: Payer: Self-pay | Admitting: *Deleted

## 2019-03-21 ENCOUNTER — Ambulatory Visit: Payer: Self-pay

## 2019-03-21 DIAGNOSIS — G20 Parkinson's disease: Secondary | ICD-10-CM

## 2019-03-21 DIAGNOSIS — R2681 Unsteadiness on feet: Secondary | ICD-10-CM

## 2019-03-21 DIAGNOSIS — R413 Other amnesia: Secondary | ICD-10-CM

## 2019-03-21 DIAGNOSIS — R296 Repeated falls: Secondary | ICD-10-CM

## 2019-03-21 DIAGNOSIS — G20C Parkinsonism, unspecified: Secondary | ICD-10-CM

## 2019-03-21 NOTE — Chronic Care Management (AMB) (Signed)
  Chronic Care Management   Note  03/21/2019 Name: Kristina Simmons MRN: [id number] DOB: Jan 25, 1934     Care Coordination: In collaboration with PCP and CCM Clinic LCSW, it is determined patient needs home visit prior to [facility] completion for long term care placement. RN CM spoke with patient son, [name] who agreed for CCM RN CM to complete home visit this pm. Will follow up with patient during visit.   Annelies Coyt E. Rollene Rotunda, RN, BSN Nurse Care Coordinator [facility] Care Management  [phone]

## 2019-03-21 NOTE — Patient Instructions (Signed)
Thank you allowing the Chronic Care Management Team to be a part of your care! It was a pleasure speaking with you today!  1. Please feel free to call this CCM social worker or Nurse care Coordinator with any questions or concerns regarding patient's placement in long term care.      CCM (Chronic Care Management) Team   Trish Fountain RN, BSN Nurse Care Coordinator  [phone]  Ruben Reason PharmD  Clinical Pharmacist  [phone]   Elliot Gurney, LCSW Clinical Social Worker [phone]  Goals Addressed            This Visit's Progress   . ":I feel that my mother needs a higher level of care (pt-stated)       Current Barriers:  . Financial constraints . Level of care concerns . ADL IADL limitations  Clinical Social Work Clinical Goal(s):  Marland Kitchen Over the next 30 days, social work will work with patient's son to address concerns related to level of care needs  Interventions: . Informed patient's son of collaboration with patient's doctor and RNCM Trish Fountain who agreed to visit patient today to complete face to face assessment for the FL2. Marland Kitchen Confirmed with son that after receiving the signed FL2 by patient's doctor, bed search will begin. . Facility preference discussed with patient's son   Patient Self Care Activities:  . Attends all scheduled provider appointments . Calls provider office for new concerns or questions  Please see past updates related to this goal by clicking on the "Past Updates" button in the selected goal          The patient verbalized understanding of instructions provided today and declined a print copy of patient instruction materials.   The care management team will reach out to the patient again over the next 7 days.

## 2019-03-21 NOTE — Progress Notes (Signed)
This encounter was created in error - please disregard.

## 2019-03-21 NOTE — Chronic Care Management (AMB) (Signed)
Chronic Care Management   Follow Up Note   03/21/2019 Name: Kristina Simmons MRN: [id number] DOB: 1934-05-20  Referred by: Arnetha Courser, MD Reason for referral : Chronic Care Management (home visit)    Objective: BP 98/62, HR 99, SaO2 97 RA, T 97.6 Oral  Assessment: Kristina Simmons is a 85 y.o. year old female who is a primary care patient of Lada, Satira Anis, MD. The CCM team was consulted for assistance with Level of Care Concerns. Patient has a history of but not limited to HTN, Compression fracture of T3 Vertebra, Unstable gait, parkinsonian tremor, Visual disturbance, and memory impairment.Today, RN CM completed a face to face in home assessment to obtain vitals and assist assessment needed to complete FL2.  Upon arrival to patients home, she was observed ambulating in the kitchen without her walker. Slow unsteady gait noted. Patient utilizing kitchen counters to stabilize herself. She appears "lost" and moving without purpose. She states "I just got up and I am a little disoriented" Patient has delayed response to RN CM questions.   There is medication laying on the counter. Unsure if patient laid medication out or if medications were placed there by her son before he left early this morning for work. RN CM spoke with son via telephone who states he allows mother to take her own medication out of the bottle except primidone and escitalopram. He administers these medications although the bottles are not out of patient's reach. The time is 1600 and patient is just now taking her Toprol xl. 24 hour tab. Unsure what time she took it yesterday nor what time she will take it tomorrow. She has not eaten today. There is food in the refrigerator but son says she will not eat unless he is home. Son works daily and patient left at home alone until 8 or 9 pm. Son is extremely concerned about mothers safety at home alone and would like placement ASAP. She recently sustained another fall since  discharge from SNF. CCM RN CM agrees in the best interest of the patient, she needs placement immediately.   Physical assessment WNL. Patient does have mild periorbital edema. She is able to answer basic questions appropriately. She is alert and oriented x4 although there is delayed response to questions. She appears very sleepy.  Prior to completion of in home appointment, RN CM provided patient Boost from refrigerator and sat with her to insure she consumed entire bottle. Per son request, RN CM assisted patient back to her bed with walker and phone at her bedside. Son was notified via telephone that RN CM was leaving.  Review of patient status, including review of consultants reports, relevant laboratory and other test results, and collaboration with appropriate care team members and the patient's provider was performed as part of comprehensive patient evaluation and provision of chronic care management services.    Goals Addressed            This Visit's Progress   . ":I feel that my mother needs a higher level of care (pt-stated)       Current Barriers:  . Financial constraints . Level of care concerns . ADL IADL limitations  Clinical Social Work Clinical Goal(s):  Marland Kitchen Over the next 30 days, social work will work with patient's son to address concerns related to level of care needs  Interventions: . Informed patient's son of collaboration with patient's doctor and RNCM Trish Fountain who agreed to visit patient today to complete face to face  assessment for the FL2. Marland Kitchen Confirmed with son that after receiving the signed FL2 by patient's doctor, bed search will begin. . Facility preference discussed with patient's son . RN CM completed home visit to assist with assessment for [facility] completion   Patient Self Care Activities:  . Attends all scheduled provider appointments . Calls provider office for new concerns or questions  Please see past updates related to this goal by clicking on the  "Past Updates" button in the selected goal         Plan: CCM RN CM will collaborate with Clinic LCSW and PCP and provide assistance as needed for longterm care placement. Will fax FL2 to PCP for completion and signature.  Telephone follow up appointment with care management team member scheduled for: 03/22/2019 with LCSW [place] E. Rollene Rotunda, RN, BSN Nurse Care Coordinator [facility] Care Management  [phone]

## 2019-03-21 NOTE — Chronic Care Management (AMB) (Signed)
   Chronic Care Management    Clinical Social Work Follow Up Note   Name: Kristina Simmons MRN: [id number] DOB: 03-04-1934  Kristina Simmons COTY STUDENT is a 85 y.o. year old female who is a primary care patient of Lada, Satira Anis, MD. The CCM team was consulted for assistance with Level of Care Concerns.   Review of patient status, including review of consultants reports, other relevant assessments, and collaboration with appropriate care team members and the patient's provider was performed as part of comprehensive patient evaluation and provision of chronic care management services.     Goals Addressed            This Visit's Progress   . ":I feel that my mother needs a higher level of care (pt-stated)       Current Barriers:  . Financial constraints . Level of care concerns . ADL IADL limitations  Clinical Social Work Clinical Goal(s):  Marland Kitchen Over the next 30 days, social work will work with patient's son to address concerns related to level of care needs  Interventions: . Informed patient's son of collaboration with patient's doctor and RNCM Trish Fountain who agreed to visit patient today to complete face to face assessment for the FL2. Marland Kitchen Confirmed with son that after receiving the signed FL2 by patient's doctor, bed search will begin. . Facility preference discussed with patient's son   Patient Self Care Activities:  . Attends all scheduled provider appointments . Calls provider office for new concerns or questions  Please see past updates related to this goal by clicking on the "Past Updates" button in the selected goal          Follow Up Plan: SW will follow up with patient's by phone over the next 14 days regarding placement   [place], [place] Worker  [facility] Care Management [phone]

## 2019-03-22 ENCOUNTER — Telehealth: Payer: Self-pay | Admitting: *Deleted

## 2019-03-22 ENCOUNTER — Ambulatory Visit: Payer: Self-pay | Admitting: *Deleted

## 2019-03-22 DIAGNOSIS — G20 Parkinson's disease: Secondary | ICD-10-CM

## 2019-03-22 DIAGNOSIS — R296 Repeated falls: Secondary | ICD-10-CM

## 2019-03-22 NOTE — Chronic Care Management (AMB) (Addendum)
  Chronic Care Management   Social Work Note  03/22/2019 Name: Kristina Simmons MRN: [id number] DOB: 14-Feb-1934  Kristina Simmons is a 85 y.o. year old female who sees Lada, Satira Anis, MD for primary care. The CCM team was consulted for assistance with Level of Care Concerns.   This Education officer, museum contacted the following nursing home to discuss bed availabilities on patient's behalf: [facility]-private pay only [facility] beds available [facility] beds available [place] beds, will send FL2 for review [facility]-Bed available [place] emailed for review [facility]-Bed available Fl2 emailed for review [facility]-Not taking patients from home [facility]-No long term beds available  Goals Addressed   None     Follow Up Plan: SW will follow up with [place], [place] and [place] by phone to follow up on potential bed offer.  Elliot Gurney, [place] Administrator, [facility] Care Management [phone]

## 2019-03-23 ENCOUNTER — Telehealth: Payer: Self-pay | Admitting: *Deleted

## 2019-03-23 ENCOUNTER — Ambulatory Visit: Payer: Self-pay | Admitting: *Deleted

## 2019-03-23 DIAGNOSIS — G20 Parkinson's disease: Secondary | ICD-10-CM

## 2019-03-23 DIAGNOSIS — R296 Repeated falls: Secondary | ICD-10-CM

## 2019-03-23 NOTE — Chronic Care Management (AMB) (Signed)
   Chronic Care Management   Unsuccessful Call Note 03/23/2019 Name: EITHEL RYALL MRN: [id number] DOB: 1934-08-08  Patient  is a 85 year old female who sees Fredderick Severance, NP  for primary care.  This CCM social worker consulted the patient to assist with the transition to a higher level of care.    This social worker was unable to reach patient's son today via telephone today to discuss status of placement for patient and next steps.  I have left HIPAA compliant voicemail asking patient to return my call. (unsuccessful outreach #1).   Plan: Will follow-up within 7 business days via telephone.     Elliot Gurney, [place] Administrator, [facility] Center[facility] Care Management [phone]

## 2019-03-23 NOTE — Chronic Care Management (AMB) (Signed)
  Chronic Care Management   Social Work Note  03/23/2019 Name: Kristina Simmons MRN: [id number] DOB: [birth year]  [name] is a 85 y.o. year old female who sees Lada, Satira Anis, MD for primary care. The CCM team was consulted for assistance with Level of Care Concerns.   Phone call to the admissions department at [facility]. This Education officer, museum spoke to [place], who confirmed receiving the FL2, however needs additional progress notes to make a formal decision. The additional information faxed to Kirsten as requested (Demographics, last progress note from the doctor and ED note) Goals Addressed   None     Follow Up Plan: SW will follow up with patient's son by phone over the next 14 days regarding status of placement  [name], [place] Worker  [facility] Care Management [phone]

## 2019-03-31 ENCOUNTER — Other Ambulatory Visit: Payer: Self-pay

## 2019-03-31 ENCOUNTER — Emergency Department
Admission: EM | Admit: 2019-03-31 | Discharge: 2019-04-01 | Disposition: A | Discharge status: Home / Self Care | Payer: PPO | Attending: Emergency Medicine | Admitting: Emergency Medicine

## 2019-03-31 ENCOUNTER — Encounter: Payer: Self-pay | Admitting: Emergency Medicine

## 2019-03-31 DIAGNOSIS — I10 Essential (primary) hypertension: Secondary | ICD-10-CM | POA: Diagnosis not present

## 2019-03-31 DIAGNOSIS — R61 Generalized hyperhidrosis: Secondary | ICD-10-CM | POA: Diagnosis not present

## 2019-03-31 DIAGNOSIS — R55 Syncope and collapse: Secondary | ICD-10-CM | POA: Diagnosis not present

## 2019-03-31 DIAGNOSIS — G20 Parkinson's disease: Secondary | ICD-10-CM | POA: Insufficient documentation

## 2019-03-31 DIAGNOSIS — Z853 Personal history of malignant neoplasm of breast: Secondary | ICD-10-CM | POA: Diagnosis not present

## 2019-03-31 DIAGNOSIS — R110 Nausea: Secondary | ICD-10-CM | POA: Diagnosis not present

## 2019-03-31 DIAGNOSIS — Z79899 Other long term (current) drug therapy: Secondary | ICD-10-CM | POA: Diagnosis not present

## 2019-03-31 DIAGNOSIS — R42 Dizziness and giddiness: Secondary | ICD-10-CM | POA: Diagnosis not present

## 2019-03-31 DIAGNOSIS — Z923 Personal history of irradiation: Secondary | ICD-10-CM | POA: Diagnosis not present

## 2019-03-31 DIAGNOSIS — R609 Edema, unspecified: Secondary | ICD-10-CM | POA: Diagnosis not present

## 2019-03-31 NOTE — ED Triage Notes (Signed)
Patient brought in from home by ems. Patient became dizzy and diaphoretic. Patient with complaint of headache, nausea and mid back pain. Denies urinary symptoms

## 2019-04-01 LAB — CBC
HCT: 42.1 % (ref 36.0–46.0)
Hemoglobin: 13.9 g/dL (ref 12.0–15.0)
MCH: 31.5 pg (ref 26.0–34.0)
MCHC: 33 g/dL (ref 30.0–36.0)
MCV: 95.5 fL (ref 80.0–100.0)
Platelets: 207 10*3/uL (ref 150–400)
RBC: 4.41 MIL/uL (ref 3.87–5.11)
RDW: 12.4 % (ref 11.5–15.5)
WBC: 6.4 10*3/uL (ref 4.0–10.5)
nRBC: 0 % (ref 0.0–0.2)

## 2019-04-01 LAB — URINALYSIS, COMPLETE (UACMP) WITH MICROSCOPIC
Bacteria, UA: NONE SEEN
Bilirubin Urine: NEGATIVE
Glucose, UA: NEGATIVE mg/dL
Hgb urine dipstick: NEGATIVE
Ketones, ur: NEGATIVE mg/dL
Leukocytes,Ua: NEGATIVE
Nitrite: NEGATIVE
Protein, ur: NEGATIVE mg/dL
Specific Gravity, Urine: 1.015 (ref 1.005–1.030)
pH: 6 (ref 5.0–8.0)

## 2019-04-01 LAB — BASIC METABOLIC PANEL
Anion gap: 9 (ref 5–15)
BUN: 16 mg/dL (ref 8–23)
CO2: 23 mmol/L (ref 22–32)
Calcium: 9.6 mg/dL (ref 8.9–10.3)
Chloride: 107 mmol/L (ref 98–111)
Creatinine, Ser: 1.14 mg/dL — ABNORMAL HIGH (ref 0.44–1.00)
GFR calc Af Amer: 52 mL/min — ABNORMAL LOW (ref >60)
GFR calc non Af Amer: 45 mL/min — ABNORMAL LOW (ref >60)
Glucose, Bld: 127 mg/dL — ABNORMAL HIGH (ref 70–99)
Potassium: 3.7 mmol/L (ref 3.5–5.1)
Sodium: 139 mmol/L (ref 135–145)

## 2019-04-01 LAB — TROPONIN I: Troponin I: <0.03 ng/mL (ref <0.03)

## 2019-04-01 MED ORDER — SODIUM CHLORIDE 0.9 % IV BOLUS
500.0000 mL | Freq: Once | INTRAVENOUS | Status: AC
  Administered 2019-04-01: 500 mL via INTRAVENOUS

## 2019-04-01 MED ORDER — ONDANSETRON HCL 4 MG/2ML IJ SOLN
4.0000 mg | Freq: Once | INTRAMUSCULAR | Status: AC
  Administered 2019-04-01: 4 mg via INTRAVENOUS
  Filled 2019-04-01: qty 2

## 2019-04-01 NOTE — ED Notes (Signed)
Patient states that he dizziness has improved at this time.

## 2019-04-01 NOTE — Discharge Instructions (Addendum)
Return to the ER for new, persistent, or worsening dizziness or lightheadedness, feeling like you are going to pass out, difficulty breathing, chest pain, severe headache, or any other new or worsening symptoms that concern you.  Follow-up with your regular doctor within the next 1 to 2 weeks.

## 2019-04-01 NOTE — ED Notes (Signed)
Patient resting quietly with eyes closed in no acute distress.  

## 2019-04-01 NOTE — ED Notes (Signed)
ED Provider at bedside. 

## 2019-04-01 NOTE — ED Provider Notes (Signed)
[facility] Emergency Department Provider Note ____________________________________________   First MD Initiated Contact with Patient 03/31/19 2351     (approximate)  I have reviewed the triage vital signs and the nursing notes.   HISTORY  Chief Complaint Dizziness    HPI Kristina Simmons is a 85 y.o. female with PMH as noted below who presents with near syncope, acute onset while the patient was standing and washing dishes, and characterized by lightheadedness and feeling like she was about to pass out.  The patient states that she had a prodrome of feeling nauseated, hot, and sweaty.  She states that she did not actually lose consciousness.  She states that the symptoms have now resolved.  She feels somewhat nauseous but denies other acute symptoms.  She had no chest pain or shortness of breath and has no significant headache.  She states that she was feeling well earlier this evening and during the day.  Past Medical History:  Diagnosis Date  . Arthritis   . Breast cancer ([place]) 2010   DCIS; diagnosed on January 15, 2009. Left   . Breast screening, unspecified   . Bursitis    left hip  . Fall   . Fibromyalgia 2011  . Humerus fracture Mar 01, 2016   left  . Kidney stones   . Osteoporosis 08/23/2018  . Personal history of malignant neoplasm of breast 2010   left breast cancer; status post wide excision with mastoplasty followed by whole breast radiation and tamoxifen  . Personal history of radiation therapy 2010   BREAST CA  . Sleep apnea 2006  . Special screening for malignant neoplasms, colon   . Tremor 2011  . Undiagnosed cardiac murmurs   . Unspecified essential hypertension     Patient Active Problem List   Diagnosis Date Noted  . Loss of memory 03/09/2019  . Compression fracture of T3 vertebra ([place]) 01/15/2019  . Osteoporosis 08/23/2018  . History of ductal carcinoma in situ (DCIS) of breast 08/18/2018  . Atypical lobular hyperplasia  [facility]) of right breast 07/31/2018  . Parkinsonian tremor ([place]) 07/19/2018  . Fatigue 10/27/2017  . Visual disturbance 10/27/2017  . Vitamin B12 deficiency 10/13/2017  . Unstable gait 09/01/2017  . Medication monitoring encounter 09/01/2017  . Small vessel disease, cerebrovascular 09/01/2017  . Cervical spine degeneration 09/01/2017  . Encounter for completion of form with patient 09/01/2017  . History of left breast cancer 09/01/2017  . Neoplasm of uncertain behavior of skin 08/12/2016  . Heartburn 07/27/2016  . Vitamin D deficiency 07/27/2016  . Benign hypertension 07/22/2016  . Humerus fracture 03/01/2016    Past Surgical History:  Procedure Laterality Date  . ABDOMINAL HYSTERECTOMY  1974  . APPENDECTOMY  1974  . BREAST BIOPSY Right 07/10/2018   RIGHT Affirm Bx ("X" clip) path pending  . BREAST EXCISIONAL BIOPSY Left 2010   breast cancer  . BREAST SURGERY Left 2010   left breast wide excision mastoplasty  . COLONOSCOPY  2004  . LITHOTRIPSY  2008  . status post radiation therapy Left    breast cancer  . UPPER GI ENDOSCOPY  May 28, 2010   Biopsy showed evidence of mild chronic gastritis. There was no evidence of H. Pylori infection. A normal duodenum was reported.     Prior to Admission medications   Medication Sig Start Date End Date Taking? Authorizing Provider  acetaminophen (TYLENOL) 500 MG tablet Take 1 tablet (500 mg total) by mouth every 6 (six) hours as needed. 01/12/19  Poulose, Bethel Born, NP  calcium carbonate (OS-CAL - DOSED IN MG OF ELEMENTAL CALCIUM) 1250 (500 Ca) MG tablet Take 1 tablet (500 mg of elemental calcium total) by mouth daily. 01/17/19   Arnetha Courser, MD  Cholecalciferol (VITAMIN D-1000 MAX ST) 1000 units tablet Take 1,000 Units by mouth daily.    [provider]  escitalopram (LEXAPRO) 10 MG tablet Take 1 tablet (10 mg total) by mouth daily. 03/09/19   Poulose, Bethel Born, NP  metoprolol succinate (TOPROL-XL) 25 MG 24 hr tablet Take 1  tablet (25 mg total) by mouth daily. 07/19/18   Arnetha Courser, MD  primidone (MYSOLINE) 50 MG tablet Take 50 mg by mouth 2 (two) times a day. 03/09/19   Jannifer Franklin, NP  traMADol (ULTRAM) 50 MG tablet Take 1 tablet (50 mg total) by mouth every 6 (six) hours as needed for moderate pain or severe pain. 01/17/19   Arnetha Courser, MD  vitamin B-12 (CYANOCOBALAMIN) 1000 MCG tablet Take 1,000 mcg by mouth daily.    [provider]    Allergies Patient has no known allergies.  Family History  Problem Relation Age of Onset  . Thyroid disease Mother   . Heart disease Father   . Breast cancer Neg Hx     Social History Social History   Tobacco Use  . Smoking status: Never Smoker  . Smokeless tobacco: Never Used  Substance Use Topics  . Alcohol use: No  . Drug use: No    Review of Systems  Constitutional: No fever. Eyes: No visual changes. ENT: No neck pain. Cardiovascular: Denies chest pain. Respiratory: Denies shortness of breath. Gastrointestinal: Positive for nausea. Genitourinary: Negative for dysuria.  Musculoskeletal: Negative for back pain. Skin: Negative for rash. Neurological: Negative for headaches, focal weakness or numbness.   ____________________________________________   PHYSICAL EXAM:  VITAL SIGNS: ED Triage Vitals  Enc Vitals Group     BP 03/31/19 2350 (!) 153/74     Pulse Rate 03/31/19 2350 60     Resp 03/31/19 2350 18     Temp 03/31/19 2350 98.2 F (36.8 C)     Temp Source 03/31/19 2350 Oral     SpO2 03/31/19 2350 99 %     Weight 03/31/19 2354 170 lb (77.1 kg)     Height 03/31/19 2354 5\' 5"  (1.651 m)     Head Circumference --      Peak Flow --      Pain Score 03/31/19 2353 5     Pain Loc --      Pain Edu? --      Excl. in [place]? --     Constitutional: Alert and oriented.  Slightly frail appearing but in no acute distress. Eyes: Conjunctivae are normal.  EOMI.  PERRLA. Head: Atraumatic. Nose: No congestion/rhinnorhea. Mouth/Throat:  Mucous membranes are moist.   Neck: Normal range of motion.  Cardiovascular: Normal rate, regular rhythm. Grossly normal heart sounds.  Good peripheral circulation. Respiratory: Normal respiratory effort.  No retractions. Lungs CTAB. Gastrointestinal: Soft and nontender. No distention.  Genitourinary: No flank tenderness. Musculoskeletal: No lower extremity edema.  Extremities warm and well perfused.  Neurologic:  Normal speech and language.  No facial droop.  Motor and sensory intact in all extremities.  Normal coordination.   Skin:  Skin is warm and dry. No rash noted. Psychiatric: Mood and affect are normal. Speech and behavior are normal.  ____________________________________________   LABS (all labs ordered are listed, but only abnormal results  are displayed)  Labs Reviewed  BASIC METABOLIC PANEL - Abnormal; Notable for the following components:      Result Value   Glucose, Bld 127 (*)    Creatinine, Ser 1.14 (*)    GFR calc non Af Amer 45 (*)    GFR calc Af Amer 52 (*)    All other components within normal limits  URINALYSIS, COMPLETE (UACMP) WITH MICROSCOPIC - Abnormal; Notable for the following components:   Color, Urine YELLOW (*)    APPearance CLEAR (*)    All other components within normal limits  CBC  TROPONIN I  CBG MONITORING, ED   ____________________________________________  EKG  ED ECG REPORT I, Arta Silence, the attending physician, personally viewed and interpreted this ECG.  Date: 04/01/2019 EKG Time: 2342 Rate: 60 Rhythm: normal sinus rhythm QRS Axis: normal Intervals: normal ST/T Wave abnormalities: normal Narrative Interpretation: no evidence of acute ischemia  ____________________________________________  RADIOLOGY    ____________________________________________   PROCEDURES  Procedure(s) performed: No  Procedures  Critical Care performed: No ____________________________________________   INITIAL IMPRESSION / ASSESSMENT  AND PLAN / ED COURSE  Pertinent labs & imaging results that were available during my care of the patient were reviewed by me and considered in my medical decision making (see chart for details).  85 year old female with PMH as noted above presents with near syncope while she was standing and washing dishes.  The patient had a prodrome of nausea, diaphoresis, and lightheadedness.  She did not lose consciousness.  She was feeling well earlier.  On exam, she is relatively comfortable appearing.  Her vital signs are normal except for mild hypertension.  Neurologic exam is nonfocal.  The remainder of the exam is unremarkable.  EKG shows no concerning acute findings.  Overall I suspect vasovagal near syncope, mild dehydration, or other benign etiology.  Given the patient's age and comorbidities we will obtain lab work-up including cardiac enzymes and I will check a urinalysis.  We will give a fluid bolus as well.  There is no indication for brain imaging given the lack of any focal neurologic findings, headache, or syncope.  I anticipate if the work-up is negative and the patient is feeling well that she may be able to go home.   ----------------------------------------- 3:42 AM on 04/01/2019 -----------------------------------------  The vital signs have remained stable throughout the ED stay.  The patient states she feels comfortable.  The lab work-up is unremarkable.  At this time, there is no indication for further ED work-up or observation.  The patient is stable for discharge home.  I called her son on the phone and discussed this with him and he agrees with discharge.  The patient very much would like to go home.  Return precautions given, and she demonstrates understanding. ____________________________________________   FINAL CLINICAL IMPRESSION(S) / ED DIAGNOSES  Final diagnoses:  Near syncope      NEW MEDICATIONS STARTED DURING THIS VISIT:  New Prescriptions   No medications on file      Note:  This document was prepared using Dragon voice recognition software and may include unintentional dictation errors.    Arta Silence, MD 04/01/19 [phone]

## 2019-04-02 ENCOUNTER — Ambulatory Visit: Payer: Self-pay | Admitting: *Deleted

## 2019-04-02 DIAGNOSIS — G20 Parkinson's disease: Secondary | ICD-10-CM

## 2019-04-02 DIAGNOSIS — R296 Repeated falls: Secondary | ICD-10-CM

## 2019-04-03 ENCOUNTER — Ambulatory Visit: Payer: Self-pay | Admitting: *Deleted

## 2019-04-03 NOTE — Chronic Care Management (AMB) (Signed)
  Chronic Care Management   Social Work Note  04/03/2019 Name: Kristina Simmons MRN: [id number] DOB: 17-Feb-1934  Kristina Simmons is a 85 y.o. year old female who sees Lada, Satira Anis, MD for primary care. The CCM team was consulted for assistance with Level of Care Concerns.   Secure email received from admissions coordinator Agustina Caroli stating that patient will be able to move in to [facility] on Thursday, 04/05/2019.  Goals Addressed   None     Follow Up Plan: SW will follow up with patient by phone over the next 7 days to confirm transition to [facility] Care [facility] [phone]

## 2019-04-03 NOTE — Patient Instructions (Signed)
Thank you allowing the Chronic Care Management Team to be a part of your care! It was a pleasure speaking with you today!   1. Please follow up with [facility] Department of Social Services regarding the status of patient's Long Term Medicaid application 2. Please follow up with the admissions department at [facility] regarding the status of patient's placement.   CCM (Chronic Care Management) Team   Trish Fountain RN, BSN Nurse Care Coordinator  [phone]  Ruben Reason PharmD  Clinical Pharmacist  [phone]   Elliot Gurney, LCSW Clinical Social Worker [phone]  Goals Addressed            This Visit's Progress   . ":I feel that my mother needs a higher level of care (pt-stated)       Current Barriers:  . Financial constraints . Level of care concerns . ADL IADL limitations  Clinical Social Work Clinical Goal(s):  Marland Kitchen Over the next 30 days, social work will work with patient's son to address concerns related to level of care needs  Interventions: . Confirmed with patient's son that he has visited [facility] and has a verbal commitment of a bed offer. Per [facility], patient will have to be in isolation for the first 14 days due to COVID precautions. Once a isolation room becomes available, patient can move in . Confirmed with patient's son that the long term care medicaid remains pending. He will need to submit more information to complete application before the long term medicaid is approved. . Placed call to the admissions office at [facility]. Left a message with Kirsten regarding status of patient's placement. . Placed a call to the Department of Social Services, left a message with Toula Moos  [phone] regarding the status of patient's long term care application.    Patient Self Care Activities:  . Attends all scheduled provider appointments . Calls provider office for new concerns or questions  Please see past updates  related to this goal by clicking on the "Past Updates" button in the selected goal          The patient verbalized understanding of instructions provided today and declined a print copy of patient instruction materials.   The care management team will reach out to the patient again over the next 7 days days.

## 2019-04-03 NOTE — Chronic Care Management (AMB) (Signed)
  Chronic Care Management    Clinical Social Work Follow Up Note  04/03/2019 Name: Kristina Simmons MRN: [id number] DOB: Feb 27, 1934  Kristina Simmons is a 85 y.o. year old female who is a primary care patient of Lada, Satira Anis, MD. The CCM team was consulted for assistance with Level of Care Concerns.   Review of patient status, including review of consultants reports, other relevant assessments, and collaboration with appropriate care team members and the patient's provider was performed as part of comprehensive patient evaluation and provision of chronic care management services.     Goals Addressed            This Visit's Progress   . ":I feel that my mother needs a higher level of care (pt-stated)       Current Barriers:  . Financial constraints . Level of care concerns . ADL IADL limitations  Clinical Social Work Clinical Goal(s):  Marland Kitchen Over the next 30 days, social work will work with patient's son to address concerns related to level of care needs  Interventions: . Confirmed with patient's son that he has visited [place] and has a verbal commitment of a bed offer. Per [facility], patient will have to be in isolation for the first 14 days due to COVID precautions. Once a isolation room becomes available, patient can move in . Confirmed with patient's son that the long term care medicaid remains pending. He will need to submit more information to complete application before the long term medicaid is approved. . Placed call to the admissions office at [facility]. Left a message with Kirsten regarding status of patient's placement. . Placed a call to the Department of Social Services, left a message with Toula Moos  [phone] regarding the status of patient's long term care application.    Patient Self Care Activities:  . Attends all scheduled provider appointments . Calls provider office for new concerns or questions  Please see past updates related to this  goal by clicking on the "Past Updates" button in the selected goal          Follow Up Plan: SW will follow up with patient by phone over the next 7 days   [place], [place] Worker  [facility] Care Management [phone]

## 2019-04-04 ENCOUNTER — Ambulatory Visit: Payer: Self-pay | Admitting: *Deleted

## 2019-04-04 ENCOUNTER — Encounter: Payer: Self-pay | Admitting: Nurse Practitioner

## 2019-04-04 ENCOUNTER — Other Ambulatory Visit: Payer: Self-pay

## 2019-04-04 ENCOUNTER — Ambulatory Visit (INDEPENDENT_AMBULATORY_CARE_PROVIDER_SITE_OTHER): Payer: PPO | Admitting: Nurse Practitioner

## 2019-04-04 DIAGNOSIS — R296 Repeated falls: Secondary | ICD-10-CM

## 2019-04-04 DIAGNOSIS — R55 Syncope and collapse: Secondary | ICD-10-CM | POA: Diagnosis not present

## 2019-04-04 DIAGNOSIS — E860 Dehydration: Secondary | ICD-10-CM

## 2019-04-04 DIAGNOSIS — G20 Parkinson's disease: Secondary | ICD-10-CM

## 2019-04-04 NOTE — Progress Notes (Signed)
Virtual Visit via Telephone Note  I connected with Kristina Simmons on 04/04/19 at  2:20 PM EDT by a telephone and verified that I am speaking with the correct person using two identifiers.   Staff discussed the limitations of evaluation and management by telephone and the availability of in person appointments. The patient expressed understanding and agreed to proceed.  Patient location: home My location: work  office Other people present:  none HPI  Patient was seen at [facility] ER on 03/31/2019 for near syncope while she was washing dishes. She had some lightheadedness and nausea and diaphoresis, symptoms resolved upon ER arrival. Blood counts, electrolytes were in normal limits, kidney function decreased some, and troponin normal. Urine clear for infection. Patient states she has been feeling good since then no episodes of lightheadedness or dizziness since then. Patient states she is supposed to be going to [facility] tomorrow. She has been drinking more water and urine is clear-pale color.  She denies chest pain, shortness of breath, palpitations, nausea, tinnitus.   PHQ2/9: Depression screen [facility] 2/9 04/04/2019 03/09/2019 02/26/2019 01/12/2019 11/30/2018  Decreased Interest 1 1 1 3  0  Down, Depressed, Hopeless 1 1 1 3  0  PHQ - 2 Score 2 2 2 6  0  Altered sleeping 3 3 3 3  -  Tired, decreased energy 3 3 3 3  -  Change in appetite 3 3 3 3  -  Feeling bad or failure about yourself  1 1 1 3  -  Trouble concentrating 0 3 3 3  -  Moving slowly or fidgety/restless 0 1 0 3 -  Suicidal thoughts 0 0 0 3 -  PHQ-9 Score 12 16 15 27  -  Difficult doing work/chores Not difficult at all Somewhat difficult Somewhat difficult Extremely dIfficult -    PHQ reviewed. Positive  Patient Active Problem List   Diagnosis Date Noted  . Loss of memory 03/09/2019  . Compression fracture of T3 vertebra ([place]) 01/15/2019  . Osteoporosis 08/23/2018  . History of ductal carcinoma in situ (DCIS) of breast 08/18/2018  .  Atypical lobular hyperplasia [facility]) of right breast 07/31/2018  . Parkinsonian tremor ([place]) 07/19/2018  . Fatigue 10/27/2017  . Visual disturbance 10/27/2017  . Vitamin B12 deficiency 10/13/2017  . Unstable gait 09/01/2017  . Medication monitoring encounter 09/01/2017  . Small vessel disease, cerebrovascular 09/01/2017  . Cervical spine degeneration 09/01/2017  . Encounter for completion of form with patient 09/01/2017  . History of left breast cancer 09/01/2017  . Neoplasm of uncertain behavior of skin 08/12/2016  . Heartburn 07/27/2016  . Vitamin D deficiency 07/27/2016  . Benign hypertension 07/22/2016  . Humerus fracture 03/01/2016    Past Medical History:  Diagnosis Date  . Arthritis   . Breast cancer ([place]) 2010   DCIS; diagnosed on January 15, 2009. Left   . Breast screening, unspecified   . Bursitis    left hip  . Fall   . Fibromyalgia 2011  . Humerus fracture Mar 01, 2016   left  . Kidney stones   . Osteoporosis 08/23/2018  . Personal history of malignant neoplasm of breast 2010   left breast cancer; status post wide excision with mastoplasty followed by whole breast radiation and tamoxifen  . Personal history of radiation therapy 2010   BREAST CA  . Sleep apnea 2006  . Special screening for malignant neoplasms, colon   . Tremor 2011  . Undiagnosed cardiac murmurs   . Unspecified essential hypertension     Past Surgical History:  Procedure  Laterality Date  . ABDOMINAL HYSTERECTOMY  1974  . APPENDECTOMY  1974  . BREAST BIOPSY Right 07/10/2018   RIGHT Affirm Bx ("X" clip) path pending  . BREAST EXCISIONAL BIOPSY Left 2010   breast cancer  . BREAST SURGERY Left 2010   left breast wide excision mastoplasty  . COLONOSCOPY  2004  . LITHOTRIPSY  2008  . status post radiation therapy Left    breast cancer  . UPPER GI ENDOSCOPY  May 28, 2010   Biopsy showed evidence of mild chronic gastritis. There was no evidence of H. Pylori infection. A normal duodenum was  reported.     Social History   Tobacco Use  . Smoking status: Never Smoker  . Smokeless tobacco: Never Used  Substance Use Topics  . Alcohol use: No     Current Outpatient Medications:  .  acetaminophen (TYLENOL) 500 MG tablet, Take 1 tablet (500 mg total) by mouth every 6 (six) hours as needed., Disp: 30 tablet, Rfl: 0 .  calcium carbonate (OS-CAL - DOSED IN MG OF ELEMENTAL CALCIUM) 1250 (500 Ca) MG tablet, Take 1 tablet (500 mg of elemental calcium total) by mouth daily., Disp: , Rfl:  .  Cholecalciferol (VITAMIN D-1000 MAX ST) 1000 units tablet, Take 1,000 Units by mouth daily., Disp: , Rfl:  .  escitalopram (LEXAPRO) 10 MG tablet, Take 1 tablet (10 mg total) by mouth daily., Disp: 1 tablet, Rfl: 0 .  metoprolol succinate (TOPROL-XL) 25 MG 24 hr tablet, Take 1 tablet (25 mg total) by mouth daily., Disp: 90 tablet, Rfl: 3 .  vitamin B-12 (CYANOCOBALAMIN) 1000 MCG tablet, Take 1,000 mcg by mouth daily., Disp: , Rfl:  .  primidone (MYSOLINE) 50 MG tablet, Take 50 mg by mouth 2 (two) times a day., Disp: , Rfl:  .  traMADol (ULTRAM) 50 MG tablet, Take 1 tablet (50 mg total) by mouth every 6 (six) hours as needed for moderate pain or severe pain. (Patient not taking: Reported on 04/04/2019), Disp: , Rfl:   No Known Allergies  ROS   No other specific complaints in a complete review of systems (except as listed in HPI above).  Objective  There were no vitals filed for this visit.  There is no height or weight on file to calculate BMI.  Nursing Note and Vital Signs reviewed.  Physical Exam Alert, able to speak full sentences without issue.   Assessment & Plan 1. Near syncope Symptoms resolved  2. Dehydration Per patients report has been hydrating well, urine clear-pale yellow color.   Patient was accepted to [facility] and should be transitioning soon. She is looking forward to the transition. She denies recent falls this week- but has been having more frequent  falls overall. Her depression is improving.     Follow Up Instructions:    I discussed the assessment and treatment plan with the patient. The patient was provided an opportunity to ask questions and all were answered. The patient agreed with the plan and demonstrated an understanding of the instructions.   The patient was advised to call back or seek an in-person evaluation if the symptoms worsen or if the condition fails to improve as anticipated.  I provided 23 minutes of non-face-to-face time during this encounter.   Fredderick Severance, NP

## 2019-04-04 NOTE — Chronic Care Management (AMB) (Signed)
  Chronic Care Management   Social Work Note  04/04/2019 Name: KATONYA BLECHER MRN: [id number] DOB: 31-Oct-1933  Harlee GUYLA BLESS is a 85 y.o. year old female who sees Lada, Satira Anis, MD for primary care. The CCM team was consulted for assistance with Level of Care Concerns.   Goals    . ":I feel that my mother needs a higher level of care (pt-stated)     Current Barriers:  . Financial constraints . Level of care concerns . ADL IADL limitations  Clinical Social Work Clinical Goal(s):  Marland Kitchen Over the next 30 days, social work will work with patient's son to address concerns related to level of care needs  Interventions: . Confirmed with patient's son that he has visited [place] and has a verbal commitment of a bed offer. However was concerned that the long term Medicaid was not yet been in place to cover the expenses of the placement. Per patient's son, there were multiple questions regarding the paperwork that needed to be submitted to the Department of Social Services and had called Medicaid worker Doctor, hospital multiple times for clarification but had not received a return call. . Placed a call to the Department of Social Services, left a message with Toula Moos  [phone] regarding the status of patient's long term care application. . [name] reports having reservations regarding the placement of patient due to the requirement that he must sign over patient's social security check to [facility] and not being able to take on the financial responsibilities of patient's household bills.  . [name] confirmed that he has already paid patient's mortgage for the month and is concerned about  . Collaboration phone call with RNCM Trish Fountain to the business office at [facility] confirming that patient's social security check will have to be signed over to the facility for payment before she moves in. Marland Kitchen RNCM Trish Fountain confirmed with the Business Office at [facility] the amount needed to secure patient's placement for tomorrow . RNCM Trish Fountain reached out to patient's son to emphasize safety risk involved in declining placement and processed his concerns regarding patient's financial responsibility . RNCM Trish Fountain was able to confirm with patient's son that he will accept bed offer for patient. He will meet with the Business Office in the morning 04/05/19 to sign the admitting paperwork and will move patient in later on in the afternoon.   Patient Self Care Activities:  . Attends all scheduled provider appointments . Calls provider office for new concerns or questions  Please see past updates related to this goal by clicking on the "Past Updates" button in the selected goal         Follow Up Plan: Client's son will sign admitting paperwork for [facility] the morning of 04/05/19 and will transition patient in the afternoon  [place], [place] Worker  [facility] Care Management [phone]

## 2019-04-04 NOTE — Patient Instructions (Signed)
Thank you allowing the Chronic Care Management Team to be a part of your care! It was a pleasure speaking with you today!  1. [name] to complete admissions paperwork for [facility] the morning of 04/05/19 and will transition patient in the afternoon. 2. [name] to continue to work with the [facility] to complete the long term medicaid process 3. [name] to call this social worker or RNCM Trish Fountain with any questions or concerns regarding placement process  CCM (Chronic Care Management) Team   Trish Fountain RN, BSN Nurse Care Coordinator  [phone]  Ruben Reason PharmD  Clinical Pharmacist  [phone]   Elliot Gurney, LCSW Clinical Social Worker [phone]  Goals Addressed            This Visit's Progress   . ":I feel that my mother needs a higher level of care (pt-stated)       Current Barriers:  . Financial constraints . Level of care concerns . ADL IADL limitations  Clinical Social Work Clinical Goal(s):  Marland Kitchen Over the next 30 days, social work will work with patient's son to address concerns related to level of care needs  Interventions: . Confirmed with patient's son that he has visited [place] and has a verbal commitment of a bed offer. However was concerned that the long term Medicaid was not yet been in place to cover the expenses of the placement. Per patient's son, there were multiple questions regarding the paperwork that needed to be submitted to the Department of Social Services and had called Medicaid worker Doctor, hospital multiple times for clarification but had not received a return call. . Placed a call to the Department of Social Services, left a message with Toula Moos  [phone] regarding the status of patient's long term care application. . [name] reports having reservations regarding the placement of patient due to the requirement that he must sign over patient's social security check to  [facility] and not being able to take on the financial responsibilities of patient's household bills.  . [name] confirmed that he has already paid patient's mortgage for the month and is concerned about  . Collaboration phone call with RNCM Trish Fountain to the business office at [facility] confirming that patient's social security check will have to be signed over to the facility for payment before she moves in. Marland Kitchen RNCM Trish Fountain confirmed with the Business Office at [facility] the amount needed to secure patient's placement for tomorrow . RNCM Trish Fountain reached out to patient's son to emphasize safety risk involved in declining placement and processed his concerns regarding patient's financial responsibility . RNCM Trish Fountain was able to confirm with patient's son that he will accept bed offer for patient. He will meet with the Business Office in the morning 04/05/19 to sign the admitting paperwork and will move patient in later on in the afternoon.   Patient Self Care Activities:  . Attends all scheduled provider appointments . Calls provider office for new concerns or questions  Please see past updates related to this goal by clicking on the "Past Updates" button in the selected goal          The patient verbalized understanding of instructions provided today and declined a print copy of patient instruction materials.   The care management team will reach out to the patient again over the next 7 days regarding status of placement to [facility] days.

## 2019-04-05 DIAGNOSIS — R5382 Chronic fatigue, unspecified: Secondary | ICD-10-CM | POA: Diagnosis not present

## 2019-04-05 DIAGNOSIS — R269 Unspecified abnormalities of gait and mobility: Secondary | ICD-10-CM | POA: Diagnosis not present

## 2019-04-05 DIAGNOSIS — G20 Parkinson's disease: Secondary | ICD-10-CM | POA: Diagnosis not present

## 2019-04-05 DIAGNOSIS — I679 Cerebrovascular disease, unspecified: Secondary | ICD-10-CM | POA: Diagnosis not present

## 2019-04-06 DIAGNOSIS — R278 Other lack of coordination: Secondary | ICD-10-CM | POA: Diagnosis not present

## 2019-04-06 DIAGNOSIS — R2681 Unsteadiness on feet: Secondary | ICD-10-CM | POA: Diagnosis not present

## 2019-04-06 DIAGNOSIS — M6281 Muscle weakness (generalized): Secondary | ICD-10-CM | POA: Diagnosis not present

## 2019-04-06 DIAGNOSIS — G20 Parkinson's disease: Secondary | ICD-10-CM | POA: Diagnosis not present

## 2019-04-06 DIAGNOSIS — R41841 Cognitive communication deficit: Secondary | ICD-10-CM | POA: Diagnosis not present

## 2019-04-06 DIAGNOSIS — R2689 Other abnormalities of gait and mobility: Secondary | ICD-10-CM | POA: Diagnosis not present

## 2019-04-06 DIAGNOSIS — R1311 Dysphagia, oral phase: Secondary | ICD-10-CM | POA: Diagnosis not present

## 2019-04-06 DIAGNOSIS — R251 Tremor, unspecified: Secondary | ICD-10-CM | POA: Diagnosis not present

## 2019-04-10 ENCOUNTER — Ambulatory Visit: Payer: Self-pay | Admitting: *Deleted

## 2019-04-10 DIAGNOSIS — G20C Parkinsonism, unspecified: Secondary | ICD-10-CM

## 2019-04-10 DIAGNOSIS — R4189 Other symptoms and signs involving cognitive functions and awareness: Secondary | ICD-10-CM | POA: Diagnosis not present

## 2019-04-10 DIAGNOSIS — R296 Repeated falls: Secondary | ICD-10-CM

## 2019-04-10 DIAGNOSIS — G20 Parkinson's disease: Secondary | ICD-10-CM

## 2019-04-10 DIAGNOSIS — K5901 Slow transit constipation: Secondary | ICD-10-CM | POA: Diagnosis not present

## 2019-04-10 NOTE — Chronic Care Management (AMB) (Signed)
  Chronic Care Management   Social Work Note  04/10/2019 Name: Kristina Simmons MRN: [id number] DOB: 05/29/1934  Kristina Simmons is a 85 y.o. year old female who sees Lada, Satira Anis, MD for primary care. The CCM team was consulted for assistance with Level of Care Concerns.   Return call from the Department of Social Services, spoke with Ms. Clayton Lefort who confirmed that patient's long term medicaid application was denied because she was not in a facility. She further stated that patient's son would have to re-apply by calling [phone] and requesting to apply for long term care medicaid.  This social worker contacted patient's son [name], explaining the above information. [name] agrees to call the Department of Social Services to re-apply for Long Term Medicaid for patient now that that she is in a facility.   Goals Addressed            This Visit's Progress   . ":I feel that my mother needs a higher level of care (pt-stated)       Current Barriers:  . Financial constraints . Level of care concerns . ADL IADL limitations  Clinical Social Work Clinical Goal(s):  Marland Kitchen Over the next 30 days, social work will work with patient's son to address concerns related to level of care needs  Interventions: . Returned call received from Ms. Rivers from the [place] stating that patient's [place] Medicaid application was denied due to the fact that she was not in a facility yet.  . Ms. Rivers confirmed that now that patient is in a facility, patient's son would need to re-apply for [place] Medicaid . This Education officer, museum confirmed the above with patient's son who agreed to call the Department of Social Services to re-apply   . Patient Self Care Activities:  . Attends all scheduled provider appointments . Calls provider office for new concerns or questions  Please see past updates related to this goal by clicking on the "Past Updates" button in the  selected goal         Follow Up Plan: SW will follow up with patient's son by phone over the next 14 days  [place], [place] Worker  [facility] Care Management [phone]

## 2019-04-10 NOTE — Patient Instructions (Addendum)
Thank you allowing the Chronic Care Management Team to be a part of your care! It was a pleasure speaking with you today!  1. Please contact the Department of Social Services to re-apply for Long term care Medicaid for patient now that she is a resident of [facility] by calling [phone] 2. Please call this social worker with any assistance needs regarding the long term care medicaid application process   CCM (Chronic Care Management) Team   Trish Fountain RN, BSN Nurse Care Coordinator  [phone]  Ruben Reason PharmD  Clinical Pharmacist  [phone]   [place], [place] Social Worker [phone]  Goals Addressed            This Visit's Progress   . ":I feel that my mother needs a higher level of care (pt-stated)       Current Barriers:  . Financial constraints . Level of care concerns . ADL IADL limitations  Clinical Social Work Clinical Goal(s):  Marland Kitchen Over the next 30 days, social work will work with patient's son to address concerns related to level of care needs  Interventions: . Returned call received from Ms. Rivers from the [place] stating that patient's [place] Medicaid application was denied due to the fact that she was not in a facility yet.  . Ms. Rivers confirmed that now that patient is in a facility, patient's son would need to re-apply for [place] Medicaid . This Education officer, museum confirmed the above with patient's son who agreed to call the Department of Social Services to re-apply   . Patient Self Care Activities:  . Attends all scheduled provider appointments . Calls provider office for new concerns or questions  Please see past updates related to this goal by clicking on the "Past Updates" button in the selected goal          The patient verbalized understanding of instructions provided today and declined a print copy of patient instruction materials.   The care management team will reach  out to the patient's again over the next 7  days.

## 2019-04-10 NOTE — Chronic Care Management (AMB) (Signed)
  Chronic Care Management    Clinical Social Work Follow Up Note  04/10/2019 Name: Kristina Simmons MRN: [id number] DOB: 06-17-1934  Kristina Simmons is a 85 y.o. year old female who is a primary care patient of Lada, Satira Anis, MD. The CCM team was consulted for assistance with Level of Care Concerns.   Review of patient status, including review of consultants reports, other relevant assessments, and collaboration with appropriate care team members and the patient's provider was performed as part of comprehensive patient evaluation and provision of chronic care management services.     Goals Addressed            This Visit's Progress   . ":I feel that my mother needs a higher level of care (pt-stated)       Current Barriers:  . Financial constraints . Level of care concerns . ADL IADL limitations  Clinical Social Work Clinical Goal(s):  Marland Kitchen Over the next 30 days, social work will work with patient's son to address concerns related to level of care needs  Interventions: . Confirmed with patient's son that patient has been transitioned to [facility] however remains concerned that the long term Medicaid was not yet been in place to cover the expenses of the placement. Per patient's son, there were multiple questions regarding the paperwork that needed to be submitted to the Department of Social Services and had called Medicaid worker Doctor, hospital multiple times for clarification but had not received a return call. . Placed another call to the Department of Social Services, left a message with Toula Moos [phone] regarding the status of patient's long term care application. . Message also left for [facility] supervisor requesting a return call as well regarding the status of patient's long term medicaid    . Patient Self Care Activities:  . Attends all scheduled provider appointments . Calls provider office for new concerns or questions  Please see past updates  related to this goal by clicking on the "Past Updates" button in the selected goal          Follow Up Plan: SW will follow up with patient by phone over the next 7 days   [place], [place] Worker  [facility] Care Management [phone]

## 2019-04-11 ENCOUNTER — Ambulatory Visit: Payer: Self-pay | Admitting: *Deleted

## 2019-04-11 DIAGNOSIS — G20 Parkinson's disease: Secondary | ICD-10-CM

## 2019-04-11 DIAGNOSIS — R296 Repeated falls: Secondary | ICD-10-CM

## 2019-04-12 ENCOUNTER — Ambulatory Visit: Payer: Self-pay | Admitting: *Deleted

## 2019-04-12 DIAGNOSIS — G20 Parkinson's disease: Secondary | ICD-10-CM

## 2019-04-12 DIAGNOSIS — R296 Repeated falls: Secondary | ICD-10-CM

## 2019-04-12 NOTE — Chronic Care Management (AMB) (Signed)
  Chronic Care Management    Clinical Social Work Follow Up Note  04/12/2019 Name: TESHIA MAHONE MRN: [id number] DOB: 08-02-1934  Jaimya KEAGHAN BOWENS is a 85 y.o. year old female who is a primary care patient of Lada, Satira Anis, MD. The CCM team was consulted for assistance with Level of Care Concerns.   Review of patient status, including review of consultants reports, other relevant assessments, and collaboration with appropriate care team members and the patient's provider was performed as part of comprehensive patient evaluation and provision of chronic care management services.     Goals Addressed            This Visit's Progress   . ":I feel that my mother needs a higher level of care (pt-stated)       Current Barriers:  . Financial constraints . Level of care concerns . ADL IADL limitations  Clinical Social Work Clinical Goal(s):  Marland Kitchen Over the next 30 days, social work will work with patient's son to address concerns related to level of care needs-complete  Interventions: . Confirmed today that patient's son has applied for long term medicaid on patient's behalf in her county of residence-[place] . Confirmed today that patient's son has informed [place] during a phone interview today that he has re-applied for long term medicaid, has left is contact information  with the case worker and the application is now pending . Confirmed with patient's son that patient has no additional community resource needs due to patient's current transition to long term care    . Patient Self Care Activities:  . Attends all scheduled provider appointments . Calls provider office for new concerns or questions  Please see past updates related to this goal by clicking on the "Past Updates" button in the selected goal          Follow Up Plan: No further social work involvement needed as patient now resides in long term care   Elliot Gurney, [facility] Care Management [phone]

## 2019-04-12 NOTE — Chronic Care Management (AMB) (Signed)
  Chronic Care Management   Social Work Note  04/12/2019 Name: Kristina Simmons MRN: [id number] DOB: 05-07-1934  Kristina Simmons is a 85 y.o. year old female who sees Lada, Satira Anis, MD for primary care. The CCM team was consulted for assistance with Level of Care Concerns.   Phone call from patient's son confirming that he will have to apply for long term care medicaid through the [place] due to patient's address. Re-assurance provided to patient's son regarding the long term medicaid application process.  Goals Addressed   None     Follow Up Plan: SW will follow up with patient by phone over the next 2 weeks regarding the long term care process  [place], [place] Worker  [facility] Care Management [phone]

## 2019-04-12 NOTE — Patient Instructions (Signed)
Thank you allowing the Chronic Care Management Team to be a part of your care! It was a pleasure speaking with you today!  1. Please call this social worker with any community resource needs or questions in the futre  CCM (Chronic Care Management) Team   Trish Fountain RN, BSN Nurse Care Coordinator  [phone]  Ruben Reason PharmD  Clinical Pharmacist  [phone]   Elliot Gurney, LCSW Clinical Social Worker [phone]  Goals Addressed            This Visit's Progress   . ":I feel that my mother needs a higher level of care (pt-stated)       Current Barriers:  . Financial constraints . Level of care concerns . ADL IADL limitations  Clinical Social Work Clinical Goal(s):  Marland Kitchen Over the next 30 days, social work will work with patient's son to address concerns related to level of care needs-complete  Interventions: . Confirmed today that patient's son has applied for long term medicaid on patient's behalf in her county of residence-[place] . Confirmed today that patient's son has informed [place] during a phone interview today that he has re-applied for long term medicaid, has left is contact information  with the case worker and the application is now pending . Confirmed with patient's son that patient has no additional community resource needs due to patient's current transition to long term care    . Patient Self Care Activities:  . Attends all scheduled provider appointments . Calls provider office for new concerns or questions  Please see past updates related to this goal by clicking on the "Past Updates" button in the selected goal          The patient verbalized understanding of instructions provided today and declined a print copy of patient instruction materials.   No further follow up required: Please contact this social worker if there are any community resource needs in the future

## 2019-04-16 ENCOUNTER — Telehealth: Payer: Self-pay | Admitting: *Deleted

## 2019-04-18 DIAGNOSIS — M6281 Muscle weakness (generalized): Secondary | ICD-10-CM | POA: Diagnosis not present

## 2019-04-18 DIAGNOSIS — R2689 Other abnormalities of gait and mobility: Secondary | ICD-10-CM | POA: Diagnosis not present

## 2019-04-18 DIAGNOSIS — R296 Repeated falls: Secondary | ICD-10-CM | POA: Diagnosis not present

## 2019-04-18 DIAGNOSIS — R278 Other lack of coordination: Secondary | ICD-10-CM | POA: Diagnosis not present

## 2019-04-18 DIAGNOSIS — R2681 Unsteadiness on feet: Secondary | ICD-10-CM | POA: Diagnosis not present

## 2019-04-18 DIAGNOSIS — R41841 Cognitive communication deficit: Secondary | ICD-10-CM | POA: Diagnosis not present

## 2019-04-18 DIAGNOSIS — R1311 Dysphagia, oral phase: Secondary | ICD-10-CM | POA: Diagnosis not present

## 2019-04-18 DIAGNOSIS — R251 Tremor, unspecified: Secondary | ICD-10-CM | POA: Diagnosis not present

## 2019-05-02 DIAGNOSIS — I679 Cerebrovascular disease, unspecified: Secondary | ICD-10-CM | POA: Diagnosis not present

## 2019-05-02 DIAGNOSIS — G20 Parkinson's disease: Secondary | ICD-10-CM | POA: Diagnosis not present

## 2019-05-02 DIAGNOSIS — R269 Unspecified abnormalities of gait and mobility: Secondary | ICD-10-CM | POA: Diagnosis not present

## 2019-05-02 DIAGNOSIS — K219 Gastro-esophageal reflux disease without esophagitis: Secondary | ICD-10-CM | POA: Diagnosis not present

## 2019-05-14 DIAGNOSIS — H1089 Other conjunctivitis: Secondary | ICD-10-CM | POA: Diagnosis not present

## 2019-05-22 DIAGNOSIS — R2681 Unsteadiness on feet: Secondary | ICD-10-CM | POA: Diagnosis not present

## 2019-05-22 DIAGNOSIS — R2689 Other abnormalities of gait and mobility: Secondary | ICD-10-CM | POA: Diagnosis not present

## 2019-05-22 DIAGNOSIS — R1311 Dysphagia, oral phase: Secondary | ICD-10-CM | POA: Diagnosis not present

## 2019-05-22 DIAGNOSIS — R41841 Cognitive communication deficit: Secondary | ICD-10-CM | POA: Diagnosis not present

## 2019-05-22 DIAGNOSIS — M6281 Muscle weakness (generalized): Secondary | ICD-10-CM | POA: Diagnosis not present

## 2019-05-22 DIAGNOSIS — R251 Tremor, unspecified: Secondary | ICD-10-CM | POA: Diagnosis not present

## 2019-05-22 DIAGNOSIS — R296 Repeated falls: Secondary | ICD-10-CM | POA: Diagnosis not present

## 2019-05-22 DIAGNOSIS — R278 Other lack of coordination: Secondary | ICD-10-CM | POA: Diagnosis not present

## 2019-05-24 DIAGNOSIS — M810 Age-related osteoporosis without current pathological fracture: Secondary | ICD-10-CM | POA: Diagnosis not present

## 2019-05-24 DIAGNOSIS — G20 Parkinson's disease: Secondary | ICD-10-CM | POA: Diagnosis not present

## 2019-05-24 DIAGNOSIS — I6789 Other cerebrovascular disease: Secondary | ICD-10-CM | POA: Diagnosis not present

## 2019-05-24 DIAGNOSIS — K219 Gastro-esophageal reflux disease without esophagitis: Secondary | ICD-10-CM | POA: Diagnosis not present

## 2019-05-24 DIAGNOSIS — F3489 Other specified persistent mood disorders: Secondary | ICD-10-CM | POA: Diagnosis not present

## 2019-05-24 DIAGNOSIS — R5383 Other fatigue: Secondary | ICD-10-CM | POA: Diagnosis not present

## 2019-05-24 DIAGNOSIS — R2689 Other abnormalities of gait and mobility: Secondary | ICD-10-CM | POA: Diagnosis not present

## 2019-05-24 DIAGNOSIS — I10 Essential (primary) hypertension: Secondary | ICD-10-CM | POA: Diagnosis not present

## 2019-05-24 DIAGNOSIS — E538 Deficiency of other specified B group vitamins: Secondary | ICD-10-CM | POA: Diagnosis not present

## 2019-05-24 DIAGNOSIS — G478 Other sleep disorders: Secondary | ICD-10-CM | POA: Diagnosis not present

## 2019-05-25 ENCOUNTER — Other Ambulatory Visit: Payer: Self-pay | Admitting: Nurse Practitioner

## 2019-05-25 DIAGNOSIS — F331 Major depressive disorder, recurrent, moderate: Secondary | ICD-10-CM

## 2019-05-29 DIAGNOSIS — R5382 Chronic fatigue, unspecified: Secondary | ICD-10-CM | POA: Diagnosis not present

## 2019-05-29 DIAGNOSIS — G4739 Other sleep apnea: Secondary | ICD-10-CM | POA: Diagnosis not present

## 2019-05-29 DIAGNOSIS — K219 Gastro-esophageal reflux disease without esophagitis: Secondary | ICD-10-CM | POA: Diagnosis not present

## 2019-05-29 DIAGNOSIS — M810 Age-related osteoporosis without current pathological fracture: Secondary | ICD-10-CM | POA: Diagnosis not present

## 2019-05-29 DIAGNOSIS — E538 Deficiency of other specified B group vitamins: Secondary | ICD-10-CM | POA: Diagnosis not present

## 2019-05-29 DIAGNOSIS — R2689 Other abnormalities of gait and mobility: Secondary | ICD-10-CM | POA: Diagnosis not present

## 2019-05-29 DIAGNOSIS — I6789 Other cerebrovascular disease: Secondary | ICD-10-CM | POA: Diagnosis not present

## 2019-05-29 DIAGNOSIS — F3489 Other specified persistent mood disorders: Secondary | ICD-10-CM | POA: Diagnosis not present

## 2019-05-29 DIAGNOSIS — I10 Essential (primary) hypertension: Secondary | ICD-10-CM | POA: Diagnosis not present

## 2019-06-19 DIAGNOSIS — R2689 Other abnormalities of gait and mobility: Secondary | ICD-10-CM | POA: Diagnosis not present

## 2019-06-19 DIAGNOSIS — R41841 Cognitive communication deficit: Secondary | ICD-10-CM | POA: Diagnosis not present

## 2019-06-19 DIAGNOSIS — M6281 Muscle weakness (generalized): Secondary | ICD-10-CM | POA: Diagnosis not present

## 2019-06-19 DIAGNOSIS — R251 Tremor, unspecified: Secondary | ICD-10-CM | POA: Diagnosis not present

## 2019-06-19 DIAGNOSIS — R278 Other lack of coordination: Secondary | ICD-10-CM | POA: Diagnosis not present

## 2019-06-19 DIAGNOSIS — R2681 Unsteadiness on feet: Secondary | ICD-10-CM | POA: Diagnosis not present

## 2019-06-19 DIAGNOSIS — R296 Repeated falls: Secondary | ICD-10-CM | POA: Diagnosis not present

## 2019-06-19 DIAGNOSIS — R1311 Dysphagia, oral phase: Secondary | ICD-10-CM | POA: Diagnosis not present

## 2019-06-26 DIAGNOSIS — I10 Essential (primary) hypertension: Secondary | ICD-10-CM | POA: Diagnosis not present

## 2019-06-26 DIAGNOSIS — I6789 Other cerebrovascular disease: Secondary | ICD-10-CM | POA: Diagnosis not present

## 2019-06-26 DIAGNOSIS — R4189 Other symptoms and signs involving cognitive functions and awareness: Secondary | ICD-10-CM | POA: Diagnosis not present

## 2019-06-26 DIAGNOSIS — G20 Parkinson's disease: Secondary | ICD-10-CM | POA: Diagnosis not present

## 2019-06-26 DIAGNOSIS — F3489 Other specified persistent mood disorders: Secondary | ICD-10-CM | POA: Diagnosis not present

## 2019-06-28 DIAGNOSIS — G20 Parkinson's disease: Secondary | ICD-10-CM | POA: Diagnosis not present

## 2019-06-28 DIAGNOSIS — I10 Essential (primary) hypertension: Secondary | ICD-10-CM | POA: Diagnosis not present

## 2019-06-28 DIAGNOSIS — F3489 Other specified persistent mood disorders: Secondary | ICD-10-CM | POA: Diagnosis not present

## 2019-06-28 DIAGNOSIS — R4189 Other symptoms and signs involving cognitive functions and awareness: Secondary | ICD-10-CM | POA: Diagnosis not present

## 2019-06-28 DIAGNOSIS — E538 Deficiency of other specified B group vitamins: Secondary | ICD-10-CM | POA: Diagnosis not present

## 2019-06-28 DIAGNOSIS — K219 Gastro-esophageal reflux disease without esophagitis: Secondary | ICD-10-CM | POA: Diagnosis not present

## 2019-07-05 DIAGNOSIS — H1089 Other conjunctivitis: Secondary | ICD-10-CM | POA: Diagnosis not present

## 2019-07-05 DIAGNOSIS — I10 Essential (primary) hypertension: Secondary | ICD-10-CM | POA: Diagnosis not present

## 2019-07-20 DIAGNOSIS — R2689 Other abnormalities of gait and mobility: Secondary | ICD-10-CM | POA: Diagnosis not present

## 2019-07-20 DIAGNOSIS — R251 Tremor, unspecified: Secondary | ICD-10-CM | POA: Diagnosis not present

## 2019-07-20 DIAGNOSIS — Z9181 History of falling: Secondary | ICD-10-CM | POA: Diagnosis not present

## 2019-07-20 DIAGNOSIS — M6281 Muscle weakness (generalized): Secondary | ICD-10-CM | POA: Diagnosis not present

## 2019-07-20 DIAGNOSIS — R1311 Dysphagia, oral phase: Secondary | ICD-10-CM | POA: Diagnosis not present

## 2019-07-20 DIAGNOSIS — R41841 Cognitive communication deficit: Secondary | ICD-10-CM | POA: Diagnosis not present

## 2019-07-20 DIAGNOSIS — R296 Repeated falls: Secondary | ICD-10-CM | POA: Diagnosis not present

## 2019-07-20 DIAGNOSIS — R278 Other lack of coordination: Secondary | ICD-10-CM | POA: Diagnosis not present

## 2019-07-20 DIAGNOSIS — R2681 Unsteadiness on feet: Secondary | ICD-10-CM | POA: Diagnosis not present

## 2019-07-24 DIAGNOSIS — U071 COVID-19: Secondary | ICD-10-CM | POA: Diagnosis not present

## 2019-07-31 DIAGNOSIS — U071 COVID-19: Secondary | ICD-10-CM | POA: Diagnosis not present

## 2019-08-07 DIAGNOSIS — M5489 Other dorsalgia: Secondary | ICD-10-CM | POA: Diagnosis not present

## 2019-08-07 DIAGNOSIS — M545 Low back pain: Secondary | ICD-10-CM | POA: Diagnosis not present

## 2019-08-07 DIAGNOSIS — R4189 Other symptoms and signs involving cognitive functions and awareness: Secondary | ICD-10-CM | POA: Diagnosis not present

## 2019-08-07 DIAGNOSIS — W1789XA Other fall from one level to another, initial encounter: Secondary | ICD-10-CM | POA: Diagnosis not present

## 2019-08-08 DIAGNOSIS — U071 COVID-19: Secondary | ICD-10-CM | POA: Diagnosis not present

## 2019-08-14 DIAGNOSIS — U071 COVID-19: Secondary | ICD-10-CM | POA: Diagnosis not present

## 2019-08-21 DIAGNOSIS — U071 COVID-19: Secondary | ICD-10-CM | POA: Diagnosis not present

## 2019-08-21 DIAGNOSIS — R2689 Other abnormalities of gait and mobility: Secondary | ICD-10-CM | POA: Diagnosis not present

## 2019-08-21 DIAGNOSIS — R296 Repeated falls: Secondary | ICD-10-CM | POA: Diagnosis not present

## 2019-08-21 DIAGNOSIS — M6281 Muscle weakness (generalized): Secondary | ICD-10-CM | POA: Diagnosis not present

## 2019-08-21 DIAGNOSIS — R41841 Cognitive communication deficit: Secondary | ICD-10-CM | POA: Diagnosis not present

## 2019-08-21 DIAGNOSIS — R251 Tremor, unspecified: Secondary | ICD-10-CM | POA: Diagnosis not present

## 2019-08-21 DIAGNOSIS — R278 Other lack of coordination: Secondary | ICD-10-CM | POA: Diagnosis not present

## 2019-08-21 DIAGNOSIS — R2681 Unsteadiness on feet: Secondary | ICD-10-CM | POA: Diagnosis not present

## 2019-08-21 DIAGNOSIS — Z9181 History of falling: Secondary | ICD-10-CM | POA: Diagnosis not present

## 2019-08-21 DIAGNOSIS — R1311 Dysphagia, oral phase: Secondary | ICD-10-CM | POA: Diagnosis not present

## 2019-08-28 DIAGNOSIS — M810 Age-related osteoporosis without current pathological fracture: Secondary | ICD-10-CM | POA: Diagnosis not present

## 2019-08-28 DIAGNOSIS — R2689 Other abnormalities of gait and mobility: Secondary | ICD-10-CM | POA: Diagnosis not present

## 2019-08-28 DIAGNOSIS — I10 Essential (primary) hypertension: Secondary | ICD-10-CM | POA: Diagnosis not present

## 2019-08-28 DIAGNOSIS — F0150 Vascular dementia without behavioral disturbance: Secondary | ICD-10-CM | POA: Diagnosis not present

## 2019-08-28 DIAGNOSIS — U071 COVID-19: Secondary | ICD-10-CM | POA: Diagnosis not present

## 2019-08-28 DIAGNOSIS — G20 Parkinson's disease: Secondary | ICD-10-CM | POA: Diagnosis not present

## 2019-08-28 DIAGNOSIS — F3489 Other specified persistent mood disorders: Secondary | ICD-10-CM | POA: Diagnosis not present

## 2019-09-03 DIAGNOSIS — U071 COVID-19: Secondary | ICD-10-CM | POA: Diagnosis not present

## 2019-09-10 DIAGNOSIS — U071 COVID-19: Secondary | ICD-10-CM | POA: Diagnosis not present

## 2019-09-18 DIAGNOSIS — U071 COVID-19: Secondary | ICD-10-CM | POA: Diagnosis not present

## 2019-09-20 DIAGNOSIS — R2681 Unsteadiness on feet: Secondary | ICD-10-CM | POA: Diagnosis not present

## 2019-09-20 DIAGNOSIS — G20 Parkinson's disease: Secondary | ICD-10-CM | POA: Diagnosis not present

## 2019-09-20 DIAGNOSIS — R278 Other lack of coordination: Secondary | ICD-10-CM | POA: Diagnosis not present

## 2019-09-20 DIAGNOSIS — R1311 Dysphagia, oral phase: Secondary | ICD-10-CM | POA: Diagnosis not present

## 2019-09-20 DIAGNOSIS — R2689 Other abnormalities of gait and mobility: Secondary | ICD-10-CM | POA: Diagnosis not present

## 2019-09-20 DIAGNOSIS — R41841 Cognitive communication deficit: Secondary | ICD-10-CM | POA: Diagnosis not present

## 2019-09-20 DIAGNOSIS — R251 Tremor, unspecified: Secondary | ICD-10-CM | POA: Diagnosis not present

## 2019-09-20 DIAGNOSIS — M6281 Muscle weakness (generalized): Secondary | ICD-10-CM | POA: Diagnosis not present

## 2019-09-20 DIAGNOSIS — Z9181 History of falling: Secondary | ICD-10-CM | POA: Diagnosis not present

## 2019-09-25 DIAGNOSIS — U071 COVID-19: Secondary | ICD-10-CM | POA: Diagnosis not present

## 2019-10-03 DIAGNOSIS — U071 COVID-19: Secondary | ICD-10-CM | POA: Diagnosis not present

## 2019-10-09 DIAGNOSIS — U071 COVID-19: Secondary | ICD-10-CM | POA: Diagnosis not present

## 2019-10-17 DIAGNOSIS — U071 COVID-19: Secondary | ICD-10-CM | POA: Diagnosis not present

## 2019-10-19 DIAGNOSIS — R1311 Dysphagia, oral phase: Secondary | ICD-10-CM | POA: Diagnosis not present

## 2019-10-19 DIAGNOSIS — R41841 Cognitive communication deficit: Secondary | ICD-10-CM | POA: Diagnosis not present

## 2019-10-19 DIAGNOSIS — Z9181 History of falling: Secondary | ICD-10-CM | POA: Diagnosis not present

## 2019-10-19 DIAGNOSIS — G20 Parkinson's disease: Secondary | ICD-10-CM | POA: Diagnosis not present

## 2019-10-19 DIAGNOSIS — R2689 Other abnormalities of gait and mobility: Secondary | ICD-10-CM | POA: Diagnosis not present

## 2019-10-19 DIAGNOSIS — R278 Other lack of coordination: Secondary | ICD-10-CM | POA: Diagnosis not present

## 2019-10-19 DIAGNOSIS — R2681 Unsteadiness on feet: Secondary | ICD-10-CM | POA: Diagnosis not present

## 2019-10-19 DIAGNOSIS — M6281 Muscle weakness (generalized): Secondary | ICD-10-CM | POA: Diagnosis not present

## 2019-10-19 DIAGNOSIS — R251 Tremor, unspecified: Secondary | ICD-10-CM | POA: Diagnosis not present

## 2019-10-24 DIAGNOSIS — U071 COVID-19: Secondary | ICD-10-CM | POA: Diagnosis not present

## 2019-10-26 DIAGNOSIS — I10 Essential (primary) hypertension: Secondary | ICD-10-CM | POA: Diagnosis not present

## 2019-10-26 DIAGNOSIS — K5901 Slow transit constipation: Secondary | ICD-10-CM | POA: Diagnosis not present

## 2019-10-26 DIAGNOSIS — F0150 Vascular dementia without behavioral disturbance: Secondary | ICD-10-CM | POA: Diagnosis not present

## 2019-10-26 DIAGNOSIS — G4739 Other sleep apnea: Secondary | ICD-10-CM | POA: Diagnosis not present

## 2019-10-26 DIAGNOSIS — K219 Gastro-esophageal reflux disease without esophagitis: Secondary | ICD-10-CM | POA: Diagnosis not present

## 2019-10-26 DIAGNOSIS — I6789 Other cerebrovascular disease: Secondary | ICD-10-CM | POA: Diagnosis not present

## 2019-10-31 DIAGNOSIS — U071 COVID-19: Secondary | ICD-10-CM | POA: Diagnosis not present

## 2019-11-08 DIAGNOSIS — U071 COVID-19: Secondary | ICD-10-CM | POA: Diagnosis not present

## 2019-11-20 DIAGNOSIS — R2681 Unsteadiness on feet: Secondary | ICD-10-CM | POA: Diagnosis not present

## 2019-11-20 DIAGNOSIS — M6281 Muscle weakness (generalized): Secondary | ICD-10-CM | POA: Diagnosis not present

## 2019-11-20 DIAGNOSIS — Z9181 History of falling: Secondary | ICD-10-CM | POA: Diagnosis not present

## 2019-11-20 DIAGNOSIS — R251 Tremor, unspecified: Secondary | ICD-10-CM | POA: Diagnosis not present

## 2019-11-20 DIAGNOSIS — R1311 Dysphagia, oral phase: Secondary | ICD-10-CM | POA: Diagnosis not present

## 2019-11-20 DIAGNOSIS — R2689 Other abnormalities of gait and mobility: Secondary | ICD-10-CM | POA: Diagnosis not present

## 2019-11-20 DIAGNOSIS — R41841 Cognitive communication deficit: Secondary | ICD-10-CM | POA: Diagnosis not present

## 2019-11-20 DIAGNOSIS — R278 Other lack of coordination: Secondary | ICD-10-CM | POA: Diagnosis not present

## 2019-11-20 DIAGNOSIS — G20 Parkinson's disease: Secondary | ICD-10-CM | POA: Diagnosis not present

## 2019-11-30 DIAGNOSIS — U071 COVID-19: Secondary | ICD-10-CM | POA: Diagnosis not present

## 2019-12-06 DIAGNOSIS — U071 COVID-19: Secondary | ICD-10-CM | POA: Diagnosis not present

## 2019-12-10 DIAGNOSIS — M5489 Other dorsalgia: Secondary | ICD-10-CM | POA: Diagnosis not present

## 2019-12-10 DIAGNOSIS — M2550 Pain in unspecified joint: Secondary | ICD-10-CM | POA: Diagnosis not present

## 2019-12-13 DIAGNOSIS — U071 COVID-19: Secondary | ICD-10-CM | POA: Diagnosis not present

## 2019-12-17 DIAGNOSIS — M6281 Muscle weakness (generalized): Secondary | ICD-10-CM | POA: Diagnosis not present

## 2019-12-17 DIAGNOSIS — R2681 Unsteadiness on feet: Secondary | ICD-10-CM | POA: Diagnosis not present

## 2019-12-17 DIAGNOSIS — R278 Other lack of coordination: Secondary | ICD-10-CM | POA: Diagnosis not present

## 2019-12-17 DIAGNOSIS — R41841 Cognitive communication deficit: Secondary | ICD-10-CM | POA: Diagnosis not present

## 2019-12-17 DIAGNOSIS — R251 Tremor, unspecified: Secondary | ICD-10-CM | POA: Diagnosis not present

## 2019-12-17 DIAGNOSIS — G20 Parkinson's disease: Secondary | ICD-10-CM | POA: Diagnosis not present

## 2019-12-17 DIAGNOSIS — R2689 Other abnormalities of gait and mobility: Secondary | ICD-10-CM | POA: Diagnosis not present

## 2019-12-17 DIAGNOSIS — Z9181 History of falling: Secondary | ICD-10-CM | POA: Diagnosis not present

## 2019-12-17 DIAGNOSIS — R1311 Dysphagia, oral phase: Secondary | ICD-10-CM | POA: Diagnosis not present

## 2019-12-20 DIAGNOSIS — U071 COVID-19: Secondary | ICD-10-CM | POA: Diagnosis not present

## 2019-12-25 DIAGNOSIS — G20 Parkinson's disease: Secondary | ICD-10-CM | POA: Diagnosis not present

## 2019-12-25 DIAGNOSIS — R2689 Other abnormalities of gait and mobility: Secondary | ICD-10-CM | POA: Diagnosis not present

## 2019-12-25 DIAGNOSIS — I10 Essential (primary) hypertension: Secondary | ICD-10-CM | POA: Diagnosis not present

## 2019-12-25 DIAGNOSIS — M810 Age-related osteoporosis without current pathological fracture: Secondary | ICD-10-CM | POA: Diagnosis not present

## 2019-12-25 DIAGNOSIS — F39 Unspecified mood [affective] disorder: Secondary | ICD-10-CM | POA: Diagnosis not present

## 2019-12-25 DIAGNOSIS — I6789 Other cerebrovascular disease: Secondary | ICD-10-CM | POA: Diagnosis not present

## 2019-12-25 DIAGNOSIS — R4189 Other symptoms and signs involving cognitive functions and awareness: Secondary | ICD-10-CM | POA: Diagnosis not present

## 2019-12-25 DIAGNOSIS — H1013 Acute atopic conjunctivitis, bilateral: Secondary | ICD-10-CM | POA: Diagnosis not present

## 2019-12-25 DIAGNOSIS — K5901 Slow transit constipation: Secondary | ICD-10-CM | POA: Diagnosis not present

## 2019-12-28 DIAGNOSIS — U071 COVID-19: Secondary | ICD-10-CM | POA: Diagnosis not present

## 2020-01-03 DIAGNOSIS — H109 Unspecified conjunctivitis: Secondary | ICD-10-CM | POA: Diagnosis not present

## 2020-01-04 DIAGNOSIS — U071 COVID-19: Secondary | ICD-10-CM | POA: Diagnosis not present

## 2020-01-11 DIAGNOSIS — H538 Other visual disturbances: Secondary | ICD-10-CM | POA: Diagnosis not present

## 2020-01-11 DIAGNOSIS — H11151 Pinguecula, right eye: Secondary | ICD-10-CM | POA: Diagnosis not present

## 2020-01-11 DIAGNOSIS — H1089 Other conjunctivitis: Secondary | ICD-10-CM | POA: Diagnosis not present

## 2020-01-15 DIAGNOSIS — H524 Presbyopia: Secondary | ICD-10-CM | POA: Diagnosis not present

## 2020-01-15 DIAGNOSIS — H43813 Vitreous degeneration, bilateral: Secondary | ICD-10-CM | POA: Diagnosis not present

## 2020-01-15 DIAGNOSIS — H35363 Drusen (degenerative) of macula, bilateral: Secondary | ICD-10-CM | POA: Diagnosis not present

## 2020-01-15 DIAGNOSIS — H35033 Hypertensive retinopathy, bilateral: Secondary | ICD-10-CM | POA: Diagnosis not present

## 2020-01-16 DIAGNOSIS — I10 Essential (primary) hypertension: Secondary | ICD-10-CM | POA: Diagnosis not present

## 2020-01-16 DIAGNOSIS — H268 Other specified cataract: Secondary | ICD-10-CM | POA: Diagnosis not present

## 2020-01-16 DIAGNOSIS — H11151 Pinguecula, right eye: Secondary | ICD-10-CM | POA: Diagnosis not present

## 2020-01-17 DIAGNOSIS — G20 Parkinson's disease: Secondary | ICD-10-CM | POA: Diagnosis not present

## 2020-01-17 DIAGNOSIS — R251 Tremor, unspecified: Secondary | ICD-10-CM | POA: Diagnosis not present

## 2020-01-17 DIAGNOSIS — R1311 Dysphagia, oral phase: Secondary | ICD-10-CM | POA: Diagnosis not present

## 2020-01-17 DIAGNOSIS — R1312 Dysphagia, oropharyngeal phase: Secondary | ICD-10-CM | POA: Diagnosis not present

## 2020-01-17 DIAGNOSIS — R2681 Unsteadiness on feet: Secondary | ICD-10-CM | POA: Diagnosis not present

## 2020-01-17 DIAGNOSIS — R2689 Other abnormalities of gait and mobility: Secondary | ICD-10-CM | POA: Diagnosis not present

## 2020-01-17 DIAGNOSIS — M6281 Muscle weakness (generalized): Secondary | ICD-10-CM | POA: Diagnosis not present

## 2020-01-17 DIAGNOSIS — R41841 Cognitive communication deficit: Secondary | ICD-10-CM | POA: Diagnosis not present

## 2020-01-17 DIAGNOSIS — Z9181 History of falling: Secondary | ICD-10-CM | POA: Diagnosis not present

## 2020-01-17 DIAGNOSIS — R278 Other lack of coordination: Secondary | ICD-10-CM | POA: Diagnosis not present

## 2020-01-21 DIAGNOSIS — L239 Allergic contact dermatitis, unspecified cause: Secondary | ICD-10-CM | POA: Diagnosis not present

## 2020-01-21 DIAGNOSIS — M1388 Other specified arthritis, other site: Secondary | ICD-10-CM | POA: Diagnosis not present

## 2020-01-24 DIAGNOSIS — L258 Unspecified contact dermatitis due to other agents: Secondary | ICD-10-CM | POA: Diagnosis not present

## 2020-02-04 DIAGNOSIS — U071 COVID-19: Secondary | ICD-10-CM | POA: Diagnosis not present

## 2020-02-05 DIAGNOSIS — H268 Other specified cataract: Secondary | ICD-10-CM | POA: Diagnosis not present

## 2020-02-05 DIAGNOSIS — L258 Unspecified contact dermatitis due to other agents: Secondary | ICD-10-CM | POA: Diagnosis not present

## 2020-02-11 DIAGNOSIS — U071 COVID-19: Secondary | ICD-10-CM | POA: Diagnosis not present

## 2020-02-18 DIAGNOSIS — M25551 Pain in right hip: Secondary | ICD-10-CM | POA: Diagnosis not present

## 2020-02-18 DIAGNOSIS — M5489 Other dorsalgia: Secondary | ICD-10-CM | POA: Diagnosis not present

## 2020-02-18 DIAGNOSIS — M797 Fibromyalgia: Secondary | ICD-10-CM | POA: Diagnosis not present

## 2020-02-18 DIAGNOSIS — M25511 Pain in right shoulder: Secondary | ICD-10-CM | POA: Diagnosis not present

## 2020-02-18 DIAGNOSIS — M25552 Pain in left hip: Secondary | ICD-10-CM | POA: Diagnosis not present

## 2020-02-26 DIAGNOSIS — G20 Parkinson's disease: Secondary | ICD-10-CM | POA: Diagnosis not present

## 2020-02-26 DIAGNOSIS — E538 Deficiency of other specified B group vitamins: Secondary | ICD-10-CM | POA: Diagnosis not present

## 2020-02-26 DIAGNOSIS — I6789 Other cerebrovascular disease: Secondary | ICD-10-CM | POA: Diagnosis not present

## 2020-02-26 DIAGNOSIS — K219 Gastro-esophageal reflux disease without esophagitis: Secondary | ICD-10-CM | POA: Diagnosis not present

## 2020-02-26 DIAGNOSIS — F0150 Vascular dementia without behavioral disturbance: Secondary | ICD-10-CM | POA: Diagnosis not present

## 2020-02-26 DIAGNOSIS — I10 Essential (primary) hypertension: Secondary | ICD-10-CM | POA: Diagnosis not present

## 2020-02-28 DIAGNOSIS — E538 Deficiency of other specified B group vitamins: Secondary | ICD-10-CM | POA: Diagnosis not present

## 2020-02-28 DIAGNOSIS — E568 Deficiency of other vitamins: Secondary | ICD-10-CM | POA: Diagnosis not present

## 2020-02-28 DIAGNOSIS — I6789 Other cerebrovascular disease: Secondary | ICD-10-CM | POA: Diagnosis not present

## 2020-02-28 DIAGNOSIS — M1388 Other specified arthritis, other site: Secondary | ICD-10-CM | POA: Diagnosis not present

## 2020-02-28 DIAGNOSIS — M810 Age-related osteoporosis without current pathological fracture: Secondary | ICD-10-CM | POA: Diagnosis not present

## 2020-02-28 DIAGNOSIS — F0150 Vascular dementia without behavioral disturbance: Secondary | ICD-10-CM | POA: Diagnosis not present

## 2020-03-30 DIAGNOSIS — R41841 Cognitive communication deficit: Secondary | ICD-10-CM | POA: Diagnosis not present

## 2020-03-30 DIAGNOSIS — R251 Tremor, unspecified: Secondary | ICD-10-CM | POA: Diagnosis not present

## 2020-03-30 DIAGNOSIS — M6281 Muscle weakness (generalized): Secondary | ICD-10-CM | POA: Diagnosis not present

## 2020-03-30 DIAGNOSIS — R1312 Dysphagia, oropharyngeal phase: Secondary | ICD-10-CM | POA: Diagnosis not present

## 2020-03-30 DIAGNOSIS — R2681 Unsteadiness on feet: Secondary | ICD-10-CM | POA: Diagnosis not present

## 2020-03-30 DIAGNOSIS — Z9181 History of falling: Secondary | ICD-10-CM | POA: Diagnosis not present

## 2020-03-30 DIAGNOSIS — R2689 Other abnormalities of gait and mobility: Secondary | ICD-10-CM | POA: Diagnosis not present

## 2020-03-30 DIAGNOSIS — G20 Parkinson's disease: Secondary | ICD-10-CM | POA: Diagnosis not present

## 2020-03-30 DIAGNOSIS — R1311 Dysphagia, oral phase: Secondary | ICD-10-CM | POA: Diagnosis not present

## 2020-03-30 DIAGNOSIS — R278 Other lack of coordination: Secondary | ICD-10-CM | POA: Diagnosis not present

## 2020-03-31 DIAGNOSIS — F329 Major depressive disorder, single episode, unspecified: Secondary | ICD-10-CM | POA: Diagnosis not present

## 2020-03-31 DIAGNOSIS — G4700 Insomnia, unspecified: Secondary | ICD-10-CM | POA: Diagnosis not present

## 2020-04-03 DIAGNOSIS — L2089 Other atopic dermatitis: Secondary | ICD-10-CM | POA: Diagnosis not present

## 2020-04-15 DIAGNOSIS — F329 Major depressive disorder, single episode, unspecified: Secondary | ICD-10-CM | POA: Diagnosis not present

## 2020-04-15 DIAGNOSIS — G4700 Insomnia, unspecified: Secondary | ICD-10-CM | POA: Diagnosis not present

## 2020-04-17 DIAGNOSIS — R251 Tremor, unspecified: Secondary | ICD-10-CM | POA: Diagnosis not present

## 2020-04-17 DIAGNOSIS — R41841 Cognitive communication deficit: Secondary | ICD-10-CM | POA: Diagnosis not present

## 2020-04-17 DIAGNOSIS — R1312 Dysphagia, oropharyngeal phase: Secondary | ICD-10-CM | POA: Diagnosis not present

## 2020-04-17 DIAGNOSIS — R1311 Dysphagia, oral phase: Secondary | ICD-10-CM | POA: Diagnosis not present

## 2020-04-17 DIAGNOSIS — Z9181 History of falling: Secondary | ICD-10-CM | POA: Diagnosis not present

## 2020-04-17 DIAGNOSIS — M6281 Muscle weakness (generalized): Secondary | ICD-10-CM | POA: Diagnosis not present

## 2020-04-17 DIAGNOSIS — R278 Other lack of coordination: Secondary | ICD-10-CM | POA: Diagnosis not present

## 2020-04-17 DIAGNOSIS — R2681 Unsteadiness on feet: Secondary | ICD-10-CM | POA: Diagnosis not present

## 2020-04-17 DIAGNOSIS — G20 Parkinson's disease: Secondary | ICD-10-CM | POA: Diagnosis not present

## 2020-04-17 DIAGNOSIS — R2689 Other abnormalities of gait and mobility: Secondary | ICD-10-CM | POA: Diagnosis not present

## 2020-04-22 DIAGNOSIS — R262 Difficulty in walking, not elsewhere classified: Secondary | ICD-10-CM | POA: Diagnosis not present

## 2020-04-22 DIAGNOSIS — M79675 Pain in left toe(s): Secondary | ICD-10-CM | POA: Diagnosis not present

## 2020-04-22 DIAGNOSIS — I739 Peripheral vascular disease, unspecified: Secondary | ICD-10-CM | POA: Diagnosis not present

## 2020-04-22 DIAGNOSIS — L603 Nail dystrophy: Secondary | ICD-10-CM | POA: Diagnosis not present

## 2020-04-22 DIAGNOSIS — B351 Tinea unguium: Secondary | ICD-10-CM | POA: Diagnosis not present

## 2020-04-22 DIAGNOSIS — M79674 Pain in right toe(s): Secondary | ICD-10-CM | POA: Diagnosis not present

## 2020-04-24 DIAGNOSIS — F09 Unspecified mental disorder due to known physiological condition: Secondary | ICD-10-CM | POA: Diagnosis not present

## 2020-04-24 DIAGNOSIS — G20 Parkinson's disease: Secondary | ICD-10-CM | POA: Diagnosis not present

## 2020-04-24 DIAGNOSIS — M810 Age-related osteoporosis without current pathological fracture: Secondary | ICD-10-CM | POA: Diagnosis not present

## 2020-04-24 DIAGNOSIS — R269 Unspecified abnormalities of gait and mobility: Secondary | ICD-10-CM | POA: Diagnosis not present

## 2020-04-30 DIAGNOSIS — E559 Vitamin D deficiency, unspecified: Secondary | ICD-10-CM | POA: Diagnosis not present

## 2020-04-30 DIAGNOSIS — E569 Vitamin deficiency, unspecified: Secondary | ICD-10-CM | POA: Diagnosis not present

## 2020-05-01 DIAGNOSIS — E568 Deficiency of other vitamins: Secondary | ICD-10-CM | POA: Diagnosis not present

## 2020-05-18 DIAGNOSIS — R2681 Unsteadiness on feet: Secondary | ICD-10-CM | POA: Diagnosis not present

## 2020-05-18 DIAGNOSIS — R278 Other lack of coordination: Secondary | ICD-10-CM | POA: Diagnosis not present

## 2020-05-18 DIAGNOSIS — R251 Tremor, unspecified: Secondary | ICD-10-CM | POA: Diagnosis not present

## 2020-05-18 DIAGNOSIS — R2689 Other abnormalities of gait and mobility: Secondary | ICD-10-CM | POA: Diagnosis not present

## 2020-05-18 DIAGNOSIS — M6281 Muscle weakness (generalized): Secondary | ICD-10-CM | POA: Diagnosis not present

## 2020-05-18 DIAGNOSIS — R1312 Dysphagia, oropharyngeal phase: Secondary | ICD-10-CM | POA: Diagnosis not present

## 2020-05-18 DIAGNOSIS — R41841 Cognitive communication deficit: Secondary | ICD-10-CM | POA: Diagnosis not present

## 2020-05-18 DIAGNOSIS — Z9181 History of falling: Secondary | ICD-10-CM | POA: Diagnosis not present

## 2020-05-18 DIAGNOSIS — R1311 Dysphagia, oral phase: Secondary | ICD-10-CM | POA: Diagnosis not present

## 2020-05-18 DIAGNOSIS — G20 Parkinson's disease: Secondary | ICD-10-CM | POA: Diagnosis not present

## 2020-05-23 DIAGNOSIS — Z20822 Contact with and (suspected) exposure to covid-19: Secondary | ICD-10-CM | POA: Diagnosis not present

## 2020-05-30 DIAGNOSIS — Z20822 Contact with and (suspected) exposure to covid-19: Secondary | ICD-10-CM | POA: Diagnosis not present

## 2020-06-05 DIAGNOSIS — Z20822 Contact with and (suspected) exposure to covid-19: Secondary | ICD-10-CM | POA: Diagnosis not present

## 2020-06-09 DIAGNOSIS — Z20822 Contact with and (suspected) exposure to covid-19: Secondary | ICD-10-CM | POA: Diagnosis not present

## 2020-06-13 DIAGNOSIS — Z20822 Contact with and (suspected) exposure to covid-19: Secondary | ICD-10-CM | POA: Diagnosis not present

## 2020-06-18 DIAGNOSIS — M6281 Muscle weakness (generalized): Secondary | ICD-10-CM | POA: Diagnosis not present

## 2020-06-18 DIAGNOSIS — G20 Parkinson's disease: Secondary | ICD-10-CM | POA: Diagnosis not present

## 2020-06-18 DIAGNOSIS — R2689 Other abnormalities of gait and mobility: Secondary | ICD-10-CM | POA: Diagnosis not present

## 2020-06-19 DIAGNOSIS — Z20822 Contact with and (suspected) exposure to covid-19: Secondary | ICD-10-CM | POA: Diagnosis not present

## 2020-06-24 DIAGNOSIS — Z20822 Contact with and (suspected) exposure to covid-19: Secondary | ICD-10-CM | POA: Diagnosis not present

## 2020-06-26 DIAGNOSIS — I10 Essential (primary) hypertension: Secondary | ICD-10-CM | POA: Diagnosis not present

## 2020-06-26 DIAGNOSIS — K219 Gastro-esophageal reflux disease without esophagitis: Secondary | ICD-10-CM | POA: Diagnosis not present

## 2020-06-26 DIAGNOSIS — I6789 Other cerebrovascular disease: Secondary | ICD-10-CM | POA: Diagnosis not present

## 2020-06-26 DIAGNOSIS — G20 Parkinson's disease: Secondary | ICD-10-CM | POA: Diagnosis not present

## 2020-06-26 DIAGNOSIS — F0150 Vascular dementia without behavioral disturbance: Secondary | ICD-10-CM | POA: Diagnosis not present

## 2020-07-01 DIAGNOSIS — Z20822 Contact with and (suspected) exposure to covid-19: Secondary | ICD-10-CM | POA: Diagnosis not present

## 2020-07-02 DIAGNOSIS — M5033 Other cervical disc degeneration, cervicothoracic region: Secondary | ICD-10-CM | POA: Diagnosis not present

## 2020-07-02 DIAGNOSIS — M810 Age-related osteoporosis without current pathological fracture: Secondary | ICD-10-CM | POA: Diagnosis not present

## 2020-07-02 DIAGNOSIS — F329 Major depressive disorder, single episode, unspecified: Secondary | ICD-10-CM | POA: Diagnosis not present

## 2020-07-02 DIAGNOSIS — G20 Parkinson's disease: Secondary | ICD-10-CM | POA: Diagnosis not present

## 2020-07-02 DIAGNOSIS — M4855XA Collapsed vertebra, not elsewhere classified, thoracolumbar region, initial encounter for fracture: Secondary | ICD-10-CM | POA: Diagnosis not present

## 2020-07-02 DIAGNOSIS — I10 Essential (primary) hypertension: Secondary | ICD-10-CM | POA: Diagnosis not present

## 2020-07-02 DIAGNOSIS — F0150 Vascular dementia without behavioral disturbance: Secondary | ICD-10-CM | POA: Diagnosis not present

## 2020-07-03 DIAGNOSIS — Z20822 Contact with and (suspected) exposure to covid-19: Secondary | ICD-10-CM | POA: Diagnosis not present

## 2020-07-07 DIAGNOSIS — M797 Fibromyalgia: Secondary | ICD-10-CM | POA: Diagnosis not present

## 2020-07-07 DIAGNOSIS — U071 COVID-19: Secondary | ICD-10-CM | POA: Diagnosis not present

## 2020-07-07 DIAGNOSIS — M545 Low back pain: Secondary | ICD-10-CM | POA: Diagnosis not present

## 2020-07-07 DIAGNOSIS — S22000A Wedge compression fracture of unspecified thoracic vertebra, initial encounter for closed fracture: Secondary | ICD-10-CM | POA: Diagnosis not present

## 2020-07-09 DIAGNOSIS — M810 Age-related osteoporosis without current pathological fracture: Secondary | ICD-10-CM | POA: Diagnosis not present

## 2020-07-09 DIAGNOSIS — F0150 Vascular dementia without behavioral disturbance: Secondary | ICD-10-CM | POA: Diagnosis not present

## 2020-07-09 DIAGNOSIS — F329 Major depressive disorder, single episode, unspecified: Secondary | ICD-10-CM | POA: Diagnosis not present

## 2020-07-09 DIAGNOSIS — I10 Essential (primary) hypertension: Secondary | ICD-10-CM | POA: Diagnosis not present

## 2020-07-09 DIAGNOSIS — M4855XA Collapsed vertebra, not elsewhere classified, thoracolumbar region, initial encounter for fracture: Secondary | ICD-10-CM | POA: Diagnosis not present

## 2020-07-09 DIAGNOSIS — G20 Parkinson's disease: Secondary | ICD-10-CM | POA: Diagnosis not present

## 2020-07-09 DIAGNOSIS — M5033 Other cervical disc degeneration, cervicothoracic region: Secondary | ICD-10-CM | POA: Diagnosis not present

## 2020-07-14 DIAGNOSIS — G20 Parkinson's disease: Secondary | ICD-10-CM | POA: Diagnosis not present

## 2020-07-14 DIAGNOSIS — F331 Major depressive disorder, recurrent, moderate: Secondary | ICD-10-CM | POA: Diagnosis not present

## 2020-07-14 DIAGNOSIS — F329 Major depressive disorder, single episode, unspecified: Secondary | ICD-10-CM | POA: Diagnosis not present

## 2020-07-14 DIAGNOSIS — M5033 Other cervical disc degeneration, cervicothoracic region: Secondary | ICD-10-CM | POA: Diagnosis not present

## 2020-07-14 DIAGNOSIS — F0150 Vascular dementia without behavioral disturbance: Secondary | ICD-10-CM | POA: Diagnosis not present

## 2020-07-14 DIAGNOSIS — G4700 Insomnia, unspecified: Secondary | ICD-10-CM | POA: Diagnosis not present

## 2020-07-14 DIAGNOSIS — M810 Age-related osteoporosis without current pathological fracture: Secondary | ICD-10-CM | POA: Diagnosis not present

## 2020-07-14 DIAGNOSIS — Z20822 Contact with and (suspected) exposure to covid-19: Secondary | ICD-10-CM | POA: Diagnosis not present

## 2020-07-14 DIAGNOSIS — M4855XA Collapsed vertebra, not elsewhere classified, thoracolumbar region, initial encounter for fracture: Secondary | ICD-10-CM | POA: Diagnosis not present

## 2020-07-14 DIAGNOSIS — I10 Essential (primary) hypertension: Secondary | ICD-10-CM | POA: Diagnosis not present

## 2020-07-17 DIAGNOSIS — Z20822 Contact with and (suspected) exposure to covid-19: Secondary | ICD-10-CM | POA: Diagnosis not present

## 2020-07-18 DIAGNOSIS — R41841 Cognitive communication deficit: Secondary | ICD-10-CM | POA: Diagnosis not present

## 2020-07-18 DIAGNOSIS — G20 Parkinson's disease: Secondary | ICD-10-CM | POA: Diagnosis not present

## 2020-07-18 DIAGNOSIS — R1312 Dysphagia, oropharyngeal phase: Secondary | ICD-10-CM | POA: Diagnosis not present

## 2020-07-18 DIAGNOSIS — R2689 Other abnormalities of gait and mobility: Secondary | ICD-10-CM | POA: Diagnosis not present

## 2020-07-18 DIAGNOSIS — R278 Other lack of coordination: Secondary | ICD-10-CM | POA: Diagnosis not present

## 2020-07-18 DIAGNOSIS — Z9181 History of falling: Secondary | ICD-10-CM | POA: Diagnosis not present

## 2020-07-18 DIAGNOSIS — M6281 Muscle weakness (generalized): Secondary | ICD-10-CM | POA: Diagnosis not present

## 2020-07-18 DIAGNOSIS — R1311 Dysphagia, oral phase: Secondary | ICD-10-CM | POA: Diagnosis not present

## 2020-07-18 DIAGNOSIS — R2681 Unsteadiness on feet: Secondary | ICD-10-CM | POA: Diagnosis not present

## 2020-07-22 DIAGNOSIS — G20 Parkinson's disease: Secondary | ICD-10-CM | POA: Diagnosis not present

## 2020-07-22 DIAGNOSIS — F0150 Vascular dementia without behavioral disturbance: Secondary | ICD-10-CM | POA: Diagnosis not present

## 2020-07-22 DIAGNOSIS — M810 Age-related osteoporosis without current pathological fracture: Secondary | ICD-10-CM | POA: Diagnosis not present

## 2020-07-22 DIAGNOSIS — U071 COVID-19: Secondary | ICD-10-CM | POA: Diagnosis not present

## 2020-07-22 DIAGNOSIS — I10 Essential (primary) hypertension: Secondary | ICD-10-CM | POA: Diagnosis not present

## 2020-07-22 DIAGNOSIS — F329 Major depressive disorder, single episode, unspecified: Secondary | ICD-10-CM | POA: Diagnosis not present

## 2020-07-22 DIAGNOSIS — M4855XA Collapsed vertebra, not elsewhere classified, thoracolumbar region, initial encounter for fracture: Secondary | ICD-10-CM | POA: Diagnosis not present

## 2020-07-22 DIAGNOSIS — M5033 Other cervical disc degeneration, cervicothoracic region: Secondary | ICD-10-CM | POA: Diagnosis not present

## 2020-07-23 DIAGNOSIS — F0150 Vascular dementia without behavioral disturbance: Secondary | ICD-10-CM | POA: Diagnosis not present

## 2020-07-23 DIAGNOSIS — M5033 Other cervical disc degeneration, cervicothoracic region: Secondary | ICD-10-CM | POA: Diagnosis not present

## 2020-07-23 DIAGNOSIS — G20 Parkinson's disease: Secondary | ICD-10-CM | POA: Diagnosis not present

## 2020-07-23 DIAGNOSIS — I10 Essential (primary) hypertension: Secondary | ICD-10-CM | POA: Diagnosis not present

## 2020-07-23 DIAGNOSIS — M4855XA Collapsed vertebra, not elsewhere classified, thoracolumbar region, initial encounter for fracture: Secondary | ICD-10-CM | POA: Diagnosis not present

## 2020-07-23 DIAGNOSIS — M810 Age-related osteoporosis without current pathological fracture: Secondary | ICD-10-CM | POA: Diagnosis not present

## 2020-07-23 DIAGNOSIS — F329 Major depressive disorder, single episode, unspecified: Secondary | ICD-10-CM | POA: Diagnosis not present

## 2020-07-24 DIAGNOSIS — U071 COVID-19: Secondary | ICD-10-CM | POA: Diagnosis not present

## 2020-07-25 ENCOUNTER — Encounter (HOSPITAL_COMMUNITY): Payer: Self-pay | Admitting: Emergency Medicine

## 2020-07-25 ENCOUNTER — Emergency Department (HOSPITAL_COMMUNITY): Payer: PPO

## 2020-07-25 ENCOUNTER — Observation Stay (HOSPITAL_COMMUNITY)
Admission: EM | Admit: 2020-07-25 | Discharge: 2020-07-26 | Disposition: A | Discharge status: Home / Self Care | Payer: PPO | Attending: Internal Medicine | Admitting: Internal Medicine

## 2020-07-25 DIAGNOSIS — Z79899 Other long term (current) drug therapy: Secondary | ICD-10-CM | POA: Insufficient documentation

## 2020-07-25 DIAGNOSIS — M5033 Other cervical disc degeneration, cervicothoracic region: Secondary | ICD-10-CM | POA: Diagnosis not present

## 2020-07-25 DIAGNOSIS — I10 Essential (primary) hypertension: Secondary | ICD-10-CM | POA: Insufficient documentation

## 2020-07-25 DIAGNOSIS — R55 Syncope and collapse: Secondary | ICD-10-CM

## 2020-07-25 DIAGNOSIS — Z853 Personal history of malignant neoplasm of breast: Secondary | ICD-10-CM | POA: Insufficient documentation

## 2020-07-25 DIAGNOSIS — E049 Nontoxic goiter, unspecified: Secondary | ICD-10-CM | POA: Diagnosis not present

## 2020-07-25 DIAGNOSIS — G9389 Other specified disorders of brain: Secondary | ICD-10-CM | POA: Diagnosis not present

## 2020-07-25 DIAGNOSIS — R4020 Unspecified coma: Secondary | ICD-10-CM | POA: Diagnosis not present

## 2020-07-25 DIAGNOSIS — R29810 Facial weakness: Secondary | ICD-10-CM | POA: Diagnosis not present

## 2020-07-25 DIAGNOSIS — I959 Hypotension, unspecified: Secondary | ICD-10-CM | POA: Diagnosis not present

## 2020-07-25 DIAGNOSIS — N3001 Acute cystitis with hematuria: Secondary | ICD-10-CM | POA: Diagnosis not present

## 2020-07-25 DIAGNOSIS — R110 Nausea: Secondary | ICD-10-CM | POA: Diagnosis not present

## 2020-07-25 DIAGNOSIS — T1490XA Injury, unspecified, initial encounter: Secondary | ICD-10-CM

## 2020-07-25 DIAGNOSIS — G20C Parkinsonism, unspecified: Secondary | ICD-10-CM | POA: Diagnosis present

## 2020-07-25 DIAGNOSIS — R2681 Unsteadiness on feet: Secondary | ICD-10-CM | POA: Diagnosis present

## 2020-07-25 DIAGNOSIS — G20 Parkinson's disease: Secondary | ICD-10-CM | POA: Insufficient documentation

## 2020-07-25 DIAGNOSIS — F0150 Vascular dementia without behavioral disturbance: Secondary | ICD-10-CM | POA: Diagnosis not present

## 2020-07-25 DIAGNOSIS — R21 Rash and other nonspecific skin eruption: Secondary | ICD-10-CM | POA: Diagnosis not present

## 2020-07-25 DIAGNOSIS — Z20822 Contact with and (suspected) exposure to covid-19: Secondary | ICD-10-CM | POA: Insufficient documentation

## 2020-07-25 DIAGNOSIS — G459 Transient cerebral ischemic attack, unspecified: Principal | ICD-10-CM | POA: Diagnosis present

## 2020-07-25 DIAGNOSIS — S199XXA Unspecified injury of neck, initial encounter: Secondary | ICD-10-CM | POA: Diagnosis not present

## 2020-07-25 DIAGNOSIS — M4855XA Collapsed vertebra, not elsewhere classified, thoracolumbar region, initial encounter for fracture: Secondary | ICD-10-CM | POA: Diagnosis not present

## 2020-07-25 DIAGNOSIS — M810 Age-related osteoporosis without current pathological fracture: Secondary | ICD-10-CM | POA: Diagnosis not present

## 2020-07-25 DIAGNOSIS — H0589 Other disorders of orbit: Secondary | ICD-10-CM | POA: Diagnosis not present

## 2020-07-25 DIAGNOSIS — F329 Major depressive disorder, single episode, unspecified: Secondary | ICD-10-CM | POA: Diagnosis not present

## 2020-07-25 DIAGNOSIS — R9431 Abnormal electrocardiogram [ECG] [EKG]: Secondary | ICD-10-CM | POA: Diagnosis not present

## 2020-07-25 HISTORY — DX: Parkinson's disease without dyskinesia, without mention of fluctuations: G20.A1

## 2020-07-25 HISTORY — DX: Parkinson's disease: G20

## 2020-07-25 LAB — CBC
HCT: 45.9 % (ref 36.0–46.0)
Hemoglobin: 14.3 g/dL (ref 12.0–15.0)
MCH: 32.4 pg (ref 26.0–34.0)
MCHC: 31.2 g/dL (ref 30.0–36.0)
MCV: 103.8 fL — ABNORMAL HIGH (ref 80.0–100.0)
Platelets: 203 10*3/uL (ref 150–400)
RBC: 4.42 MIL/uL (ref 3.87–5.11)
RDW: 11.9 % (ref 11.5–15.5)
WBC: 10.9 10*3/uL — ABNORMAL HIGH (ref 4.0–10.5)
nRBC: 0 % (ref 0.0–0.2)

## 2020-07-25 LAB — I-STAT CHEM 8, ED
BUN: 25 mg/dL — ABNORMAL HIGH (ref 8–23)
Calcium, Ion: 1.23 mmol/L (ref 1.15–1.40)
Chloride: 107 mmol/L (ref 98–111)
Creatinine, Ser: 1.1 mg/dL — ABNORMAL HIGH (ref 0.44–1.00)
Glucose, Bld: 116 mg/dL — ABNORMAL HIGH (ref 70–99)
HCT: 44 % (ref 36.0–46.0)
Hemoglobin: 15 g/dL (ref 12.0–15.0)
Potassium: 3.9 mmol/L (ref 3.5–5.1)
Sodium: 141 mmol/L (ref 135–145)
TCO2: 24 mmol/L (ref 22–32)

## 2020-07-25 LAB — URINALYSIS, ROUTINE W REFLEX MICROSCOPIC
Bilirubin Urine: NEGATIVE
Glucose, UA: NEGATIVE mg/dL
Ketones, ur: NEGATIVE mg/dL
Nitrite: NEGATIVE
Protein, ur: 30 mg/dL — AB
RBC / HPF: >50 RBC/hpf — ABNORMAL HIGH (ref 0–5)
Specific Gravity, Urine: >1.046 — ABNORMAL HIGH (ref 1.005–1.030)
WBC, UA: >50 WBC/hpf — ABNORMAL HIGH (ref 0–5)
pH: 5 (ref 5.0–8.0)

## 2020-07-25 LAB — COMPREHENSIVE METABOLIC PANEL
ALT: 23 U/L (ref 0–44)
AST: 22 U/L (ref 15–41)
Albumin: 3.5 g/dL (ref 3.5–5.0)
Alkaline Phosphatase: 67 U/L (ref 38–126)
Anion gap: 12 (ref 5–15)
BUN: 23 mg/dL (ref 8–23)
CO2: 22 mmol/L (ref 22–32)
Calcium: 9.5 mg/dL (ref 8.9–10.3)
Chloride: 107 mmol/L (ref 98–111)
Creatinine, Ser: 1.17 mg/dL — ABNORMAL HIGH (ref 0.44–1.00)
GFR, Estimated: 43 mL/min — ABNORMAL LOW (ref >60)
Glucose, Bld: 119 mg/dL — ABNORMAL HIGH (ref 70–99)
Potassium: 3.9 mmol/L (ref 3.5–5.1)
Sodium: 141 mmol/L (ref 135–145)
Total Bilirubin: 0.3 mg/dL (ref 0.3–1.2)
Total Protein: 6.3 g/dL — ABNORMAL LOW (ref 6.5–8.1)

## 2020-07-25 LAB — RAPID URINE DRUG SCREEN, HOSP PERFORMED
Amphetamines: NOT DETECTED
Barbiturates: POSITIVE — AB
Benzodiazepines: NOT DETECTED
Cocaine: NOT DETECTED
Opiates: NOT DETECTED
Tetrahydrocannabinol: NOT DETECTED

## 2020-07-25 LAB — DIFFERENTIAL
Abs Immature Granulocytes: 0.03 10*3/uL (ref 0.00–0.07)
Basophils Absolute: 0.1 10*3/uL (ref 0.0–0.1)
Basophils Relative: 1 %
Eosinophils Absolute: 0.2 10*3/uL (ref 0.0–0.5)
Eosinophils Relative: 2 %
Immature Granulocytes: 0 %
Lymphocytes Relative: 17 %
Lymphs Abs: 1.9 10*3/uL (ref 0.7–4.0)
Monocytes Absolute: 0.8 10*3/uL (ref 0.1–1.0)
Monocytes Relative: 7 %
Neutro Abs: 8.1 10*3/uL — ABNORMAL HIGH (ref 1.7–7.7)
Neutrophils Relative %: 73 %

## 2020-07-25 LAB — PROTIME-INR
INR: 1 (ref 0.8–1.2)
Prothrombin Time: 13 seconds (ref 11.4–15.2)

## 2020-07-25 LAB — APTT: aPTT: 29 seconds (ref 24–36)

## 2020-07-25 LAB — ETHANOL: Alcohol, Ethyl (B): <10 mg/dL (ref <10)

## 2020-07-25 LAB — CBG MONITORING, ED: Glucose-Capillary: 107 mg/dL — ABNORMAL HIGH (ref 70–99)

## 2020-07-25 MED ORDER — IOHEXOL 350 MG/ML SOLN
60.0000 mL | Freq: Once | INTRAVENOUS | Status: AC | PRN
  Administered 2020-07-25: 60 mL via INTRAVENOUS

## 2020-07-25 MED ORDER — ACETAMINOPHEN 500 MG PO TABS
500.0000 mg | ORAL_TABLET | Freq: Four times a day (QID) | ORAL | Status: DC | PRN
  Administered 2020-07-26: 500 mg via ORAL
  Filled 2020-07-25: qty 1

## 2020-07-25 MED ORDER — STROKE: EARLY STAGES OF RECOVERY BOOK
Freq: Once | Status: DC
  Filled 2020-07-25: qty 1

## 2020-07-25 MED ORDER — ASPIRIN EC 81 MG PO TBEC
81.0000 mg | DELAYED_RELEASE_TABLET | Freq: Every day | ORAL | Status: DC
  Administered 2020-07-26: 81 mg via ORAL
  Filled 2020-07-25: qty 1

## 2020-07-25 MED ORDER — NITROFURANTOIN MONOHYD MACRO 100 MG PO CAPS
100.0000 mg | ORAL_CAPSULE | Freq: Two times a day (BID) | ORAL | Status: DC
  Administered 2020-07-26 (×2): 100 mg via ORAL
  Filled 2020-07-25 (×3): qty 1

## 2020-07-25 MED ORDER — SODIUM CHLORIDE 0.9 % IV SOLN: 1.0000 g | Freq: Once | INTRAVENOUS | Status: DC

## 2020-07-25 MED ORDER — ONDANSETRON HCL 4 MG/2ML IJ SOLN
4.0000 mg | Freq: Once | INTRAMUSCULAR | Status: AC
  Administered 2020-07-25: 4 mg via INTRAVENOUS
  Filled 2020-07-25: qty 2

## 2020-07-25 MED ORDER — GADOBUTROL 1 MMOL/ML IV SOLN
7.5000 mL | Freq: Once | INTRAVENOUS | Status: AC | PRN
  Administered 2020-07-25: 7.5 mL via INTRAVENOUS

## 2020-07-25 MED ORDER — POLYETHYLENE GLYCOL 3350 17 G PO PACK
17.0000 g | PACK | Freq: Every day | ORAL | Status: DC
  Administered 2020-07-26: 17 g via ORAL
  Filled 2020-07-25: qty 1

## 2020-07-25 MED ORDER — METOPROLOL SUCCINATE ER 25 MG PO TB24: 25.0000 mg | ORAL_TABLET | Freq: Every day | ORAL | Status: DC

## 2020-07-25 MED ORDER — ESCITALOPRAM OXALATE 10 MG PO TABS
15.0000 mg | ORAL_TABLET | Freq: Every day | ORAL | Status: DC
  Administered 2020-07-26: 15 mg via ORAL
  Filled 2020-07-25: qty 2

## 2020-07-25 NOTE — ED Notes (Signed)
Pt transported to CT ?

## 2020-07-25 NOTE — ED Notes (Signed)
Pt transported to MRI 

## 2020-07-25 NOTE — H&P (Signed)
History and Physical   Kristina Simmons [id number] DOB: Apr 19, 1934 DOA: 07/25/2020  PCP: Pcp, No  Patient coming from: nursing home facility  I have personally briefly reviewed patient's old medical records in [facility] EMR  Chief Concern: unresponsive  HPI: Kristina Simmons is a 86 y.o. female with medical history significant for parkinson disease and is from nursing home facility.   She is awake and alert x4 (self, age, current location, year, and president is 'debatable'). She reports that she is in the hospital because they told me I was found unresponsive in the dining room. She reports the last thing she remembers is going into the dining room at the nursing facility and doesn't remember anything else. She states that at baseline she does not ambulate.  I spoke with son, Kristina Simmons and he states the nursing home facility says she was found unresponsive and breathing. Patient has been wheelchair bound (due to bilateral leg weakness since before nursing facility).   At bedside, ROS was negative for headache, vision changes (baseline she says her vision is poor), chest pain, shortness of breath, abdominal pain and urinary complaints. She reports she doesn't remember the last time she had a bowel movement. She endorses chronic back pain that is unchanged from baseline. She reports new neck pain x1 that is worse with cervical extension.   ED Course: discussed with ED provider. Neurology was consulted and recommends MRI inpatient and outpatient follow-up for possible neoplasm and concerns for meningeal signs.  Review of Systems: As per HPI otherwise 10 point review of systems negative.  Past Medical History:  Diagnosis Date  . Arthritis   . Breast cancer ([place]) 2010   DCIS; diagnosed on January 15, 2009. Left   . Breast screening, unspecified   . Bursitis    left hip  . Fall   . Fibromyalgia 2011  . Humerus fracture Mar 01, 2016   left  . Kidney stones   .  Osteoporosis 08/23/2018  . Parkinson's disease ([place])   . Personal history of malignant neoplasm of breast 2010   left breast cancer; status post wide excision with mastoplasty followed by whole breast radiation and tamoxifen  . Personal history of radiation therapy 2010   BREAST CA  . Sleep apnea 2006  . Special screening for malignant neoplasms, colon   . Tremor 2011  . Undiagnosed cardiac murmurs   . Unspecified essential hypertension    Past Surgical History:  Procedure Laterality Date  . ABDOMINAL HYSTERECTOMY  1974  . APPENDECTOMY  1974  . BREAST BIOPSY Right 07/10/2018   RIGHT Affirm Bx ("X" clip) path pending  . BREAST EXCISIONAL BIOPSY Left 2010   breast cancer  . BREAST SURGERY Left 2010   left breast wide excision mastoplasty  . COLONOSCOPY  2004  . LITHOTRIPSY  2008  . status post radiation therapy Left    breast cancer  . UPPER GI ENDOSCOPY  May 28, 2010   Biopsy showed evidence of mild chronic gastritis. There was no evidence of H. Pylori infection. A normal duodenum was reported.    Social History:  reports that she has never smoked. She has never used smokeless tobacco. She reports that she does not drink alcohol and does not use drugs.  No Known Allergies  Family History  Problem Relation Age of Onset  . Thyroid disease Mother   . Heart disease Father   . Breast cancer Neg Hx    Family history: Family history reviewed and not  pertinent  Prior to Admission medications   Medication Sig Start Date End Date Taking? Authorizing Provider  acetaminophen (TYLENOL) 500 MG tablet Take 1 tablet (500 mg total) by mouth every 6 (six) hours as needed. 01/12/19   Poulose, Bethel Born, NP  calcium carbonate (OS-CAL - DOSED IN MG OF ELEMENTAL CALCIUM) 1250 (500 Ca) MG tablet Take 1 tablet (500 mg of elemental calcium total) by mouth daily. 01/17/19   Arnetha Courser, MD  Cholecalciferol (VITAMIN D-1000 MAX ST) 1000 units tablet Take 1,000 Units by mouth daily.    [provider]  escitalopram (LEXAPRO) 10 MG tablet Take 1 tablet (10 mg total) by mouth daily. 03/09/19   Poulose, Bethel Born, NP  escitalopram (LEXAPRO) 5 MG tablet TAKE 1 TABLET BY MOUTH EVERY DAY 05/25/19   Poulose, Bethel Born, NP  metoprolol succinate (TOPROL-XL) 25 MG 24 hr tablet Take 1 tablet (25 mg total) by mouth daily. 07/19/18   Arnetha Courser, MD  primidone (MYSOLINE) 50 MG tablet Take 50 mg by mouth 2 (two) times a day. 03/09/19   Jannifer Franklin, NP  vitamin B-12 (CYANOCOBALAMIN) 1000 MCG tablet Take 1,000 mcg by mouth daily.    [provider]   Physical Exam: Vitals:   07/25/20 1541 07/25/20 1554 07/25/20 2100 07/25/20 2115  BP: 108/90  127/67 125/69  Pulse: 79  87 86  Resp: 18  14 12   Temp: 97.7 F (36.5 C)     TempSrc: Axillary     SpO2: 97% 97% 98% 100%   Constitutional: NAD, calm, comfortable Eyes: PERRL, lids and conjunctivae normal ENMT: Mucous membranes are moist. Posterior pharynx clear of any exudate or lesions.Normal dentition.  Neck: normal, supple, no masses, no thyromegaly Respiratory: clear to auscultation bilaterally, no wheezing, no crackles. Normal respiratory effort. No accessory muscle use.  Cardiovascular: Regular rate and rhythm, no murmurs / rubs / gallops. No extremity edema. 2+ pedal pulses. No carotid bruits.  Abdomen: no tenderness, no masses palpated. No hepatosplenomegaly. Bowel sounds positive.  Musculoskeletal: no clubbing / cyanosis. No joint deformity upper and lower extremities. Good ROM. Some contractures of left upper extremity. Skin: no rashes, lesions, ulcers. No induration Neurologic: CN 2-12 grossly intact. Sensation intact. Strength 5/5 in all 4. Resting tremors Psychiatric: Normal judgment and insight. Alert and oriented to self, age, current location, year is 2021, and for [place] president, she states, 'it's debatable'. Normal mood.  Labs on Admission: I have personally reviewed following labs and imaging  studies  CBC: Recent Labs  Lab 07/25/20 1610 07/25/20 1621  WBC 10.9*  --   NEUTROABS 8.1*  --   HGB 14.3 15.0  HCT 45.9 44.0  MCV 103.8*  --   PLT 203  --    Basic Metabolic Panel: Recent Labs  Lab 07/25/20 1610 07/25/20 1621  NA 141 141  K 3.9 3.9  CL 107 107  CO2 22  --   GLUCOSE 119* 116*  BUN 23 25*  CREATININE 1.17* 1.10*  CALCIUM 9.5  --    Liver Function Tests: Recent Labs  Lab 07/25/20 1610  AST 22  ALT 23  ALKPHOS 67  BILITOT 0.3  PROT 6.3*  ALBUMIN 3.5   Coagulation Profile: Recent Labs  Lab 07/25/20 1610  INR 1.0   CBG: Recent Labs  Lab 07/25/20 1548  GLUCAP 107*   Urine analysis:    Component Value Date/Time   COLORURINE YELLOW 07/25/2020 2000   APPEARANCEUR CLOUDY (A) 07/25/2020 2000   LABSPEC >  1.046 (H) 07/25/2020 2000   PHURINE 5.0 07/25/2020 2000   GLUCOSEU NEGATIVE 07/25/2020 2000   HGBUR LARGE (A) 07/25/2020 [place] NEGATIVE 07/25/2020 2000   KETONESUR NEGATIVE 07/25/2020 2000   PROTEINUR 30 (A) 07/25/2020 2000   NITRITE NEGATIVE 07/25/2020 2000   LEUKOCYTESUR MODERATE (A) 07/25/2020 2000   Radiological Exams on Admission: Personally reviewed and I agree with radiologist reading as below.  CT Angio Head W or Wo Contrast  Result Date: 07/25/2020 CLINICAL DATA:  Patient unresponsive dining area. Last seen well at 12:30 p.m. Left facial droop and left arm weakness. Patient's symptoms are improving. Neck pain. EXAM: CT ANGIOGRAPHY HEAD AND NECK TECHNIQUE: Multidetector CT imaging of the head and neck was performed using the standard protocol during bolus administration of intravenous contrast. Multiplanar CT image reconstructions and MIPs were obtained to evaluate the vascular anatomy. Carotid stenosis measurements (when applicable) are obtained utilizing NASCET criteria, using the distal internal carotid diameter as the denominator. CONTRAST:  50mL OMNIPAQUE IOHEXOL 350 MG/ML SOLN COMPARISON:  CT head without contrast  07/11/19 FINDINGS: CT HEAD FINDINGS Brain: Advanced atrophy and white matter disease is present. No acute or focal cortical abnormalities are present. No significant extraaxial fluid collection is present. The ventricles are proportionate to the degree of atrophy. A remote lacunar infarct is present in the posterior right cerebellum. Vascular: No hyperdense vessel or unexpected calcification. Skull: Calvarium is intact. No focal lytic or blastic lesions are present. No significant extracranial soft tissue lesion is present. Sinuses: The paranasal sinuses and mastoid air cells are clear. Orbits: Prominent soft tissue mass demonstrates interval increase in size since prior exam. This encompasses the inferior rectus muscle. There is extension into the lateral rectus muscle anteriorly and along the inferior and lateral aspect of the optic nerve on the right. CTA NECK FINDINGS Aortic arch: Atherosclerotic calcifications are present at the aortic arch. Great vessel origins are without stenosis. No aneurysm present. Right carotid system: Right common carotid artery is within limits. Mild atherosclerotic calcifications are present at the right carotid bifurcation. Cervical right ICA is within normal limits. Left carotid system: Atherosclerotic changes are present at the left carotid bifurcation. No significant stenosis is present. Cervical ICA is normal. Vertebral arteries: The vertebral arteries are codominant. Both vertebral arteries originate from the subclavian arteries without significant stenosis. Mild atherosclerotic calcifications are present. No stenosis is present in the neck. Skeleton: Facet degenerative changes are greatest the right at C4-5 and on the left at C3-4 alignment is anatomic. No focal lytic or blastic lesions are present. Other neck: Multinodular goiter is stable. No significant adenopathy is present. Salivary glands are within normal limits. No mucosal lesions present. Upper chest: Lung apices are  clear. Review of the MIP images confirms the above findings CTA HEAD FINDINGS Anterior circulation: Internal carotid arteries are within normal limits from the skull base to the ICA termini. Narrowing of the distal right M1 segment is approximately 50%. MCA bifurcations intact. No other focal stenosis or occlusion is present. Mild distal narrowing is present. Posterior circulation: The right vertebral artery is dominant. PICA origins are visualized and within normal limits. Basilar artery is normal. Both posterior cerebral arteries originate from basilar tip. PCA branch vessels are within normal limits. Venous sinuses: The dural sinuses are patent. The straight sinus deep cerebral veins are intact. Cortical veins are unremarkable. Anatomic variants: None Review of the MIP images confirms the above findings IMPRESSION: 1. No emergent large vessel occlusion. 2. Narrowing of the distal right  M1 segment measures approximately 50%. 3. Mild atherosclerotic changes at the carotid bifurcations bilaterally without significant stenosis. 4. Interval enlargement of right orbital mass, concerning for a orbital neoplasm. There is extension into the lateral rectus muscle and along the inferior and lateral aspect of the optic nerve on the right. This has been slowly growing since initially mentioned in 2017. Metastatic disease is considered less likely. MRI of the orbits without and with contrast would be useful for further evaluation and delineation. 5. Advanced atrophy and white matter disease. 6. Remote lacunar infarct in the posterior right cerebellum. 7. Multinodular goiter. 8. Aortic Atherosclerosis (ICD10-I70.0). Electronically Signed   By: San Morelle M.D.   On: 07/25/2020 20:21   CT Angio Neck W and/or Wo Contrast  Result Date: 07/25/2020 CLINICAL DATA:  Patient unresponsive dining area. Last seen well at 12:30 p.m. Left facial droop and left arm weakness. Patient's symptoms are improving. Neck pain. EXAM: CT  ANGIOGRAPHY HEAD AND NECK TECHNIQUE: Multidetector CT imaging of the head and neck was performed using the standard protocol during bolus administration of intravenous contrast. Multiplanar CT image reconstructions and MIPs were obtained to evaluate the vascular anatomy. Carotid stenosis measurements (when applicable) are obtained utilizing NASCET criteria, using the distal internal carotid diameter as the denominator. CONTRAST:  2mL OMNIPAQUE IOHEXOL 350 MG/ML SOLN COMPARISON:  CT head without contrast 07/11/19 FINDINGS: CT HEAD FINDINGS Brain: Advanced atrophy and white matter disease is present. No acute or focal cortical abnormalities are present. No significant extraaxial fluid collection is present. The ventricles are proportionate to the degree of atrophy. A remote lacunar infarct is present in the posterior right cerebellum. Vascular: No hyperdense vessel or unexpected calcification. Skull: Calvarium is intact. No focal lytic or blastic lesions are present. No significant extracranial soft tissue lesion is present. Sinuses: The paranasal sinuses and mastoid air cells are clear. Orbits: Prominent soft tissue mass demonstrates interval increase in size since prior exam. This encompasses the inferior rectus muscle. There is extension into the lateral rectus muscle anteriorly and along the inferior and lateral aspect of the optic nerve on the right. CTA NECK FINDINGS Aortic arch: Atherosclerotic calcifications are present at the aortic arch. Great vessel origins are without stenosis. No aneurysm present. Right carotid system: Right common carotid artery is within limits. Mild atherosclerotic calcifications are present at the right carotid bifurcation. Cervical right ICA is within normal limits. Left carotid system: Atherosclerotic changes are present at the left carotid bifurcation. No significant stenosis is present. Cervical ICA is normal. Vertebral arteries: The vertebral arteries are codominant. Both  vertebral arteries originate from the subclavian arteries without significant stenosis. Mild atherosclerotic calcifications are present. No stenosis is present in the neck. Skeleton: Facet degenerative changes are greatest the right at C4-5 and on the left at C3-4 alignment is anatomic. No focal lytic or blastic lesions are present. Other neck: Multinodular goiter is stable. No significant adenopathy is present. Salivary glands are within normal limits. No mucosal lesions present. Upper chest: Lung apices are clear. Review of the MIP images confirms the above findings CTA HEAD FINDINGS Anterior circulation: Internal carotid arteries are within normal limits from the skull base to the ICA termini. Narrowing of the distal right M1 segment is approximately 50%. MCA bifurcations intact. No other focal stenosis or occlusion is present. Mild distal narrowing is present. Posterior circulation: The right vertebral artery is dominant. PICA origins are visualized and within normal limits. Basilar artery is normal. Both posterior cerebral arteries originate from basilar tip. PCA  branch vessels are within normal limits. Venous sinuses: The dural sinuses are patent. The straight sinus deep cerebral veins are intact. Cortical veins are unremarkable. Anatomic variants: None Review of the MIP images confirms the above findings IMPRESSION: 1. No emergent large vessel occlusion. 2. Narrowing of the distal right M1 segment measures approximately 50%. 3. Mild atherosclerotic changes at the carotid bifurcations bilaterally without significant stenosis. 4. Interval enlargement of right orbital mass, concerning for a orbital neoplasm. There is extension into the lateral rectus muscle and along the inferior and lateral aspect of the optic nerve on the right. This has been slowly growing since initially mentioned in 2017. Metastatic disease is considered less likely. MRI of the orbits without and with contrast would be useful for further  evaluation and delineation. 5. Advanced atrophy and white matter disease. 6. Remote lacunar infarct in the posterior right cerebellum. 7. Multinodular goiter. 8. Aortic Atherosclerosis (ICD10-I70.0). Electronically Signed   By: San Morelle M.D.   On: 07/25/2020 20:21   MR Brain Wo Contrast (neuro protocol)  Result Date: 07/25/2020 CLINICAL DATA:  Patient is reported as unresponsive at nursing facility. Symptoms have improved. EXAM: MRI HEAD WITHOUT CONTRAST TECHNIQUE: Multiplanar, multiecho pulse sequences of the brain and surrounding structures were obtained without intravenous contrast. COMPARISON:  CTA head and neck 07/25/2020. FINDINGS: Brain: The diffusion-weighted images demonstrate no acute or subacute infarction. Advanced atrophy and diffuse white matter disease is present. Dilated perivascular spaces are present throughout the basal ganglia. Remote lacunar infarcts are present in the thalami. The internal auditory canals are within normal limits. Remote lacunar infarcts are present within the cerebellum bilaterally. Vascular: Flow is present in the major intracranial arteries. Skull and upper cervical spine: The craniocervical junction is normal. Upper cervical spine is within normal limits. Marrow signal is unremarkable. Sinuses/Orbits: Mild mucosal thickening is present in the anterior ethmoid air cells and inferior maxillary sinuses. The paranasal sinuses and mastoid air cells are otherwise clear. Ill-defined soft tissue mass lesion is present in the inferior right orbit. This encompasses right inferior rectus muscle. There is extension to the anterior aspect of the right lateral rectus muscle. Soft tissue extends along the inferior and lateral margin of the right optic nerve without invasion of the nerve. Globe is displaced anteriorly, but otherwise within normal limits. IMPRESSION: 1. Ill-defined soft tissue mass lesion involving the inferior right orbit and anterior aspect of the right  lateral rectus muscle. Lesion has increased over time and is concerning for neoplasm. Metastatic disease is considered with a history of breast cancer. Sarcoma centered over the muscle is considered. Recommend referral to ophthalmology. 2. The soft tissue lesion extends along the inferior and lateral margin of the right optic nerve without invasion of the nerve. 3. Advanced atrophy and diffuse white matter disease likely reflects the sequela of chronic microvascular ischemia. 4. Remote lacunar infarcts of the thalami and cerebellum bilaterally. 5. No acute intracranial abnormality to explain unresponsiveness. Electronically Signed   By: San Morelle M.D.   On: 07/25/2020 20:56   CT C-SPINE NO CHARGE  Result Date: 07/25/2020 CLINICAL DATA:  86 year old female with fall and neck trauma. EXAM: CT CERVICAL SPINE WITHOUT CONTRAST TECHNIQUE: Multidetector CT imaging of the cervical spine was performed without intravenous contrast. Multiplanar CT image reconstructions were also generated. COMPARISON:  Cervical spine CT dated 01/08/2019. FINDINGS: Alignment: No acute subluxation. Skull base and vertebrae: No acute fracture. Osteopenia. Soft tissues and spinal canal: No prevertebral fluid or swelling. No visible canal hematoma. Disc levels: Multilevel  degenerative changes with endplate irregularity and disc space narrowing. Upper chest: Negative. Other: Thyroid goiter. IMPRESSION: No acute/traumatic cervical spine pathology. Electronically Signed   By: Anner Crete M.D.   On: 07/25/2020 19:21   DG Chest Portable 1 View  Result Date: 07/25/2020 CLINICAL DATA:  86 year old female with fall. EXAM: PORTABLE CHEST 1 VIEW COMPARISON:  Chest radiograph dated 03/01/2016. FINDINGS: No focal consolidation, pleural effusion, or pneumothorax. The cardiac silhouette is within limits. Atherosclerotic calcification of the aorta. No acute osseous pathology. IMPRESSION: No active disease. Electronically Signed   By: Anner Crete M.D.   On: 07/25/2020 16:54   EKG: Independently reviewed, showing normal sinus rhythm with rate of 84, no ST-T wave changes.  Assessment/Plan Active Problems:   TIA (transient ischemic attack)   # Unresponsive/mental status change - suspect TIA - Neurology consulted by ED provider and recommended admit and they will follow - CT head w/o contrast - MRI showed: Ill-defined soft tissue mass lesion involving the inferior right orbit and anterior aspect of the right lateral rectus muscle. Lesion has increased over time and is concerning for neoplasm. Metastatic disease is considered with a history of breast cancer. Sarcoma centered over the muscle is considered. Recommend referral to Ophthalmology. The soft tissue lesion extends along the inferior and lateral margin of the right optic nerve without invasion of the nerve. Advanced atrophy and diffuse white matter disease likely reflects the sequela of chronic microvascular ischemia. Remote lacunar infarcts of the thalami and cerebellum bilaterally. No acute intracranial abnormality to explain unresponsiveness. - Fall and aspiration precautions  # Possible intracranial neoplasm - outpatient follow-up  # Bacteruria - patient denies dysuria, hematuria and increased frequency - However, given age and fragility, I will treat  - Nitrofurantoin 100 mg BID for 3 days  # Parkinson - outpatient followup  DVT prophylaxis: deferred Code Status: DNR Family Communication: discussed with son Disposition Plan: likely 07/26/20 Consults called: neurology called Admission status: observation  Siah Steely N Harvin Konicek D.O. [facility]  If 7AM-7PM, please contact day-coverage [email or web address]  07/25/2020, 10:02 PM

## 2020-07-25 NOTE — ED Notes (Signed)
Pt son is leaving bedside at this time, ok to call at any time of day/night when update is available. Would like to be updated with d/c and plan.

## 2020-07-25 NOTE — ED Provider Notes (Signed)
[facility] EMERGENCY DEPARTMENT Provider Note   CSN: [id number] Arrival date & time: 07/25/20  1540     History No chief complaint on file.   Kristina Simmons is a 86 y.o. female with pertinent past medical history of Parkinson's disease, fibromyalgia, h/o of breast cancer, arthritis presents the emergency department today for TIA EMS.  Patient lives at [facility], staff called and reported patient being unresponsive in the dining area, when EMS arrived they were able to arouse patient to painful stimuli.  They noticed that patient had left facial droop, left arm weakness and her left eye was closed.  States that after about 5 minutes it resolved with EMS.  States that she was also hypotensive to 87/47, today 150cc NS and is improved to99/50.  Patient is poor historian due to Parkinson's.  Son is in the room who is able to tell some of the story as well. Symptoms have resolved.  Patient is complaining of some mild neck pain, denies a fall.  No fever.  Denies any headache, vision changes.  Last known normal at 1230.  Son states that she had similar episode many years ago, did not get worked up for it.  He states that she is now back to her baseline, however confusion has persisted. Patient has had left eye problems for many months, states that this is nothing new with her eye.  Denies any numbness, tingling, chest pain, shortness of breath.  Not on a blood thinner. HPI     Past Medical History:  Diagnosis Date   Arthritis    Breast cancer ([place]) 2010   DCIS; diagnosed on January 15, 2009. Left    Breast screening, unspecified    Bursitis    left hip   Fall    Fibromyalgia 2011   Humerus fracture Mar 01, 2016   left   Kidney stones    Osteoporosis 08/23/2018   Parkinson's disease ([place])    Personal history of malignant neoplasm of breast 2010   left breast cancer; status post wide excision with mastoplasty followed by whole breast radiation and tamoxifen     Personal history of radiation therapy 2010   BREAST CA   Sleep apnea 2006   Special screening for malignant neoplasms, colon    Tremor 2011   Undiagnosed cardiac murmurs    Unspecified essential hypertension     Patient Active Problem List   Diagnosis Date Noted   Loss of memory 03/09/2019   Compression fracture of T3 vertebra ([place]) 01/15/2019   Osteoporosis 08/23/2018   History of ductal carcinoma in situ (DCIS) of breast 08/18/2018   Atypical lobular hyperplasia (ALH) of right breast 07/31/2018   Parkinsonian tremor ([place]) 07/19/2018   Fatigue 10/27/2017   Visual disturbance 10/27/2017   Vitamin B12 deficiency 10/13/2017   Unstable gait 09/01/2017   Medication monitoring encounter 09/01/2017   Small vessel disease, cerebrovascular 09/01/2017   Cervical spine degeneration 09/01/2017   Encounter for completion of form with patient 09/01/2017   History of left breast cancer 09/01/2017   Neoplasm of uncertain behavior of skin 08/12/2016   Heartburn 07/27/2016   Vitamin D deficiency 07/27/2016   Benign hypertension 07/22/2016   Humerus fracture 03/01/2016    Past Surgical History:  Procedure Laterality Date   ABDOMINAL HYSTERECTOMY  1974   APPENDECTOMY  1974   BREAST BIOPSY Right 07/10/2018   RIGHT Affirm Bx ("X" clip) path pending   BREAST EXCISIONAL BIOPSY Left 2010   breast cancer  BREAST SURGERY Left 2010   left breast wide excision mastoplasty   COLONOSCOPY  2004   LITHOTRIPSY  2008   status post radiation therapy Left    breast cancer   UPPER GI ENDOSCOPY  May 28, 2010   Biopsy showed evidence of mild chronic gastritis. There was no evidence of H. Pylori infection. A normal duodenum was reported.      OB History    Gravida  1   Para  1   Term      Preterm      AB      Living  1     SAB  0   TAB  0   Ectopic      Multiple      Live Births           Obstetric Comments  Age first menstrual cycle  71 Age first pregnancy 24 Last menstrual cycle 1974- due to hysterectomy        Family History  Problem Relation Age of Onset   Thyroid disease Mother    Heart disease Father    Breast cancer Neg Hx     Social History   Tobacco Use   Smoking status: Never Smoker   Smokeless tobacco: Never Used  Vaping Use   Vaping Use: Never used  Substance Use Topics   Alcohol use: No   Drug use: No    Home Medications Prior to Admission medications   Medication Sig Start Date End Date Taking? Authorizing Provider  acetaminophen (TYLENOL) 500 MG tablet Take 1 tablet (500 mg total) by mouth every 6 (six) hours as needed. 01/12/19   Poulose, Bethel Born, NP  calcium carbonate (OS-CAL - DOSED IN MG OF ELEMENTAL CALCIUM) 1250 (500 Ca) MG tablet Take 1 tablet (500 mg of elemental calcium total) by mouth daily. 01/17/19   Arnetha Courser, MD  Cholecalciferol (VITAMIN D-1000 MAX ST) 1000 units tablet Take 1,000 Units by mouth daily.    [provider]  escitalopram (LEXAPRO) 10 MG tablet Take 1 tablet (10 mg total) by mouth daily. 03/09/19   Poulose, Bethel Born, NP  escitalopram (LEXAPRO) 5 MG tablet TAKE 1 TABLET BY MOUTH EVERY DAY 05/25/19   Poulose, Bethel Born, NP  metoprolol succinate (TOPROL-XL) 25 MG 24 hr tablet Take 1 tablet (25 mg total) by mouth daily. 07/19/18   Arnetha Courser, MD  primidone (MYSOLINE) 50 MG tablet Take 50 mg by mouth 2 (two) times a day. 03/09/19   Jannifer Franklin, NP  vitamin B-12 (CYANOCOBALAMIN) 1000 MCG tablet Take 1,000 mcg by mouth daily.    [provider]    Allergies    Patient has no known allergies.  Review of Systems   Review of Systems  Constitutional: Negative for chills, diaphoresis, fatigue and fever.  HENT: Negative for congestion, sore throat and trouble swallowing.   Eyes: Negative for pain and visual disturbance.  Respiratory: Negative for cough, shortness of breath and wheezing.   Cardiovascular: Negative for chest pain,  palpitations and leg swelling.  Gastrointestinal: Negative for abdominal distention, abdominal pain, diarrhea, nausea and vomiting.  Genitourinary: Negative for difficulty urinating.  Musculoskeletal: Positive for neck pain. Negative for back pain and neck stiffness.  Skin: Negative for pallor.  Neurological: Negative for dizziness, facial asymmetry (Resolved), speech difficulty, weakness and headaches.  Psychiatric/Behavioral: Positive for confusion.    Physical Exam Updated Vital Signs BP 125/69    Pulse 86    Temp 97.7 F (36.5  C) (Axillary)    Resp 12    SpO2 100%   Physical Exam Constitutional:      General: She is not in acute distress.    Appearance: Normal appearance. She is not ill-appearing, toxic-appearing or diaphoretic.  HENT:     Mouth/Throat:     Mouth: Mucous membranes are moist.     Pharynx: Oropharynx is clear.  Eyes:     General: No scleral icterus.    Extraocular Movements: Extraocular movements intact.     Pupils: Pupils are equal, round, and reactive to light.  Cardiovascular:     Rate and Rhythm: Normal rate and regular rhythm.     Pulses: Normal pulses.     Heart sounds: Normal heart sounds.  Pulmonary:     Effort: Pulmonary effort is normal. No respiratory distress.     Breath sounds: Normal breath sounds. No stridor. No wheezing, rhonchi or rales.  Chest:     Chest wall: No tenderness.  Abdominal:     General: Abdomen is flat. There is no distension.     Palpations: Abdomen is soft.     Tenderness: There is no abdominal tenderness. There is no guarding or rebound.  Musculoskeletal:        General: No swelling. Normal range of motion.     Cervical back: Normal range of motion and neck supple. Tenderness (Midline tenderness) present. No rigidity.     Right lower leg: No edema.     Left lower leg: No edema.  Skin:    General: Skin is warm and dry.     Capillary Refill: Capillary refill takes less than 2 seconds.     Coloration: Skin is not pale.    Neurological:     General: No focal deficit present.     Mental Status: She is alert and oriented to person, place, and time.     Comments: Alert and oriented to person. Thinks that it is Tuesday, knows that it is 2021. At first unclear who son was, but then able to tell me that her son was in the room. Clear speech. No facial droop. Able to raise eyebrows normally. L eye with some ptosis. CNIII-XII grossly intact. Bilateral upper and lower extremities' sensation grossly intact. 5/5 symmetric strength with grip strength and with plantar and dorsi flexion bilaterally.  Normal finger to nose bilaterally. Negative pronator drift.    Psychiatric:        Mood and Affect: Mood normal.        Behavior: Behavior normal.     ED Results / Procedures / Treatments   Labs (all labs ordered are listed, but only abnormal results are displayed) Labs Reviewed  CBC - Abnormal; Notable for the following components:      Result Value   WBC 10.9 (*)    MCV 103.8 (*)    All other components within normal limits  DIFFERENTIAL - Abnormal; Notable for the following components:   Neutro Abs 8.1 (*)    All other components within normal limits  COMPREHENSIVE METABOLIC PANEL - Abnormal; Notable for the following components:   Glucose, Bld 119 (*)    Creatinine, Ser 1.17 (*)    Total Protein 6.3 (*)    GFR, Estimated 43 (*)    All other components within normal limits  RAPID URINE DRUG SCREEN, HOSP PERFORMED - Abnormal; Notable for the following components:   Barbiturates POSITIVE (*)    All other components within normal limits  URINALYSIS, ROUTINE W REFLEX MICROSCOPIC -  Abnormal; Notable for the following components:   APPearance CLOUDY (*)    Specific Gravity, Urine >1.046 (*)    Hgb urine dipstick LARGE (*)    Protein, ur 30 (*)    Leukocytes,Ua MODERATE (*)    RBC / HPF >50 (*)    WBC, UA >50 (*)    Bacteria, UA RARE (*)    All other components within normal limits  CBG MONITORING, ED - Abnormal;  Notable for the following components:   Glucose-Capillary 107 (*)    All other components within normal limits  I-STAT CHEM 8, ED - Abnormal; Notable for the following components:   BUN 25 (*)    Creatinine, Ser 1.10 (*)    Glucose, Bld 116 (*)    All other components within normal limits  URINE CULTURE  ETHANOL  PROTIME-INR  APTT  Will continue to metastatic disease.  EKG None  Radiology CT Angio Head W or Wo Contrast  Result Date: 07/25/2020 CLINICAL DATA:  Patient unresponsive dining area. Last seen well at 12:30 p.m. Left facial droop and left arm weakness. Patient's symptoms are improving. Neck pain. EXAM: CT ANGIOGRAPHY HEAD AND NECK TECHNIQUE: Multidetector CT imaging of the head and neck was performed using the standard protocol during bolus administration of intravenous contrast. Multiplanar CT image reconstructions and MIPs were obtained to evaluate the vascular anatomy. Carotid stenosis measurements (when applicable) are obtained utilizing NASCET criteria, using the distal internal carotid diameter as the denominator. CONTRAST:  26mL OMNIPAQUE IOHEXOL 350 MG/ML SOLN COMPARISON:  CT head without contrast 07/11/19 FINDINGS: CT HEAD FINDINGS Brain: Advanced atrophy and white matter disease is present. No acute or focal cortical abnormalities are present. No significant extraaxial fluid collection is present. The ventricles are proportionate to the degree of atrophy. A remote lacunar infarct is present in the posterior right cerebellum. Vascular: No hyperdense vessel or unexpected calcification. Skull: Calvarium is intact. No focal lytic or blastic lesions are present. No significant extracranial soft tissue lesion is present. Sinuses: The paranasal sinuses and mastoid air cells are clear. Orbits: Prominent soft tissue mass demonstrates interval increase in size since prior exam. This encompasses the inferior rectus muscle. There is extension into the lateral rectus muscle anteriorly and  along the inferior and lateral aspect of the optic nerve on the right. CTA NECK FINDINGS Aortic arch: Atherosclerotic calcifications are present at the aortic arch. Great vessel origins are without stenosis. No aneurysm present. Right carotid system: Right common carotid artery is within limits. Mild atherosclerotic calcifications are present at the right carotid bifurcation. Cervical right ICA is within normal limits. Left carotid system: Atherosclerotic changes are present at the left carotid bifurcation. No significant stenosis is present. Cervical ICA is normal. Vertebral arteries: The vertebral arteries are codominant. Both vertebral arteries originate from the subclavian arteries without significant stenosis. Mild atherosclerotic calcifications are present. No stenosis is present in the neck. Skeleton: Facet degenerative changes are greatest the right at C4-5 and on the left at C3-4 alignment is anatomic. No focal lytic or blastic lesions are present. Other neck: Multinodular goiter is stable. No significant adenopathy is present. Salivary glands are within normal limits. No mucosal lesions present. Upper chest: Lung apices are clear. Review of the MIP images confirms the above findings CTA HEAD FINDINGS Anterior circulation: Internal carotid arteries are within normal limits from the skull base to the ICA termini. Narrowing of the distal right M1 segment is approximately 50%. MCA bifurcations intact. No other focal stenosis or occlusion is present.  Mild distal narrowing is present. Posterior circulation: The right vertebral artery is dominant. PICA origins are visualized and within normal limits. Basilar artery is normal. Both posterior cerebral arteries originate from basilar tip. PCA branch vessels are within normal limits. Venous sinuses: The dural sinuses are patent. The straight sinus deep cerebral veins are intact. Cortical veins are unremarkable. Anatomic variants: None Review of the MIP images  confirms the above findings IMPRESSION: 1. No emergent large vessel occlusion. 2. Narrowing of the distal right M1 segment measures approximately 50%. 3. Mild atherosclerotic changes at the carotid bifurcations bilaterally without significant stenosis. 4. Interval enlargement of right orbital mass, concerning for a orbital neoplasm. There is extension into the lateral rectus muscle and along the inferior and lateral aspect of the optic nerve on the right. This has been slowly growing since initially mentioned in 2017. Metastatic disease is considered less likely. MRI of the orbits without and with contrast would be useful for further evaluation and delineation. 5. Advanced atrophy and white matter disease. 6. Remote lacunar infarct in the posterior right cerebellum. 7. Multinodular goiter. 8. Aortic Atherosclerosis (ICD10-I70.0). Electronically Signed   By: San Morelle M.D.   On: 07/25/2020 20:21   CT Angio Neck W and/or Wo Contrast  Result Date: 07/25/2020 CLINICAL DATA:  Patient unresponsive dining area. Last seen well at 12:30 p.m. Left facial droop and left arm weakness. Patient's symptoms are improving. Neck pain. EXAM: CT ANGIOGRAPHY HEAD AND NECK TECHNIQUE: Multidetector CT imaging of the head and neck was performed using the standard protocol during bolus administration of intravenous contrast. Multiplanar CT image reconstructions and MIPs were obtained to evaluate the vascular anatomy. Carotid stenosis measurements (when applicable) are obtained utilizing NASCET criteria, using the distal internal carotid diameter as the denominator. CONTRAST:  77mL OMNIPAQUE IOHEXOL 350 MG/ML SOLN COMPARISON:  CT head without contrast 07/11/19 FINDINGS: CT HEAD FINDINGS Brain: Advanced atrophy and white matter disease is present. No acute or focal cortical abnormalities are present. No significant extraaxial fluid collection is present. The ventricles are proportionate to the degree of atrophy. A remote lacunar  infarct is present in the posterior right cerebellum. Vascular: No hyperdense vessel or unexpected calcification. Skull: Calvarium is intact. No focal lytic or blastic lesions are present. No significant extracranial soft tissue lesion is present. Sinuses: The paranasal sinuses and mastoid air cells are clear. Orbits: Prominent soft tissue mass demonstrates interval increase in size since prior exam. This encompasses the inferior rectus muscle. There is extension into the lateral rectus muscle anteriorly and along the inferior and lateral aspect of the optic nerve on the right. CTA NECK FINDINGS Aortic arch: Atherosclerotic calcifications are present at the aortic arch. Great vessel origins are without stenosis. No aneurysm present. Right carotid system: Right common carotid artery is within limits. Mild atherosclerotic calcifications are present at the right carotid bifurcation. Cervical right ICA is within normal limits. Left carotid system: Atherosclerotic changes are present at the left carotid bifurcation. No significant stenosis is present. Cervical ICA is normal. Vertebral arteries: The vertebral arteries are codominant. Both vertebral arteries originate from the subclavian arteries without significant stenosis. Mild atherosclerotic calcifications are present. No stenosis is present in the neck. Skeleton: Facet degenerative changes are greatest the right at C4-5 and on the left at C3-4 alignment is anatomic. No focal lytic or blastic lesions are present. Other neck: Multinodular goiter is stable. No significant adenopathy is present. Salivary glands are within normal limits. No mucosal lesions present. Upper chest: Lung apices are clear.  Review of the MIP images confirms the above findings CTA HEAD FINDINGS Anterior circulation: Internal carotid arteries are within normal limits from the skull base to the ICA termini. Narrowing of the distal right M1 segment is approximately 50%. MCA bifurcations intact. No  other focal stenosis or occlusion is present. Mild distal narrowing is present. Posterior circulation: The right vertebral artery is dominant. PICA origins are visualized and within normal limits. Basilar artery is normal. Both posterior cerebral arteries originate from basilar tip. PCA branch vessels are within normal limits. Venous sinuses: The dural sinuses are patent. The straight sinus deep cerebral veins are intact. Cortical veins are unremarkable. Anatomic variants: None Review of the MIP images confirms the above findings IMPRESSION: 1. No emergent large vessel occlusion. 2. Narrowing of the distal right M1 segment measures approximately 50%. 3. Mild atherosclerotic changes at the carotid bifurcations bilaterally without significant stenosis. 4. Interval enlargement of right orbital mass, concerning for a orbital neoplasm. There is extension into the lateral rectus muscle and along the inferior and lateral aspect of the optic nerve on the right. This has been slowly growing since initially mentioned in 2017. Metastatic disease is considered less likely. MRI of the orbits without and with contrast would be useful for further evaluation and delineation. 5. Advanced atrophy and white matter disease. 6. Remote lacunar infarct in the posterior right cerebellum. 7. Multinodular goiter. 8. Aortic Atherosclerosis (ICD10-I70.0). Electronically Signed   By: San Morelle M.D.   On: 07/25/2020 20:21   MR Brain Wo Contrast (neuro protocol)  Result Date: 07/25/2020 CLINICAL DATA:  Patient is reported as unresponsive at nursing facility. Symptoms have improved. EXAM: MRI HEAD WITHOUT CONTRAST TECHNIQUE: Multiplanar, multiecho pulse sequences of the brain and surrounding structures were obtained without intravenous contrast. COMPARISON:  CTA head and neck 07/25/2020. FINDINGS: Brain: The diffusion-weighted images demonstrate no acute or subacute infarction. Advanced atrophy and diffuse white matter disease is  present. Dilated perivascular spaces are present throughout the basal ganglia. Remote lacunar infarcts are present in the thalami. The internal auditory canals are within normal limits. Remote lacunar infarcts are present within the cerebellum bilaterally. Vascular: Flow is present in the major intracranial arteries. Skull and upper cervical spine: The craniocervical junction is normal. Upper cervical spine is within normal limits. Marrow signal is unremarkable. Sinuses/Orbits: Mild mucosal thickening is present in the anterior ethmoid air cells and inferior maxillary sinuses. The paranasal sinuses and mastoid air cells are otherwise clear. Ill-defined soft tissue mass lesion is present in the inferior right orbit. This encompasses right inferior rectus muscle. There is extension to the anterior aspect of the right lateral rectus muscle. Soft tissue extends along the inferior and lateral margin of the right optic nerve without invasion of the nerve. Globe is displaced anteriorly, but otherwise within normal limits. IMPRESSION: 1. Ill-defined soft tissue mass lesion involving the inferior right orbit and anterior aspect of the right lateral rectus muscle. Lesion has increased over time and is concerning for neoplasm. Metastatic disease is considered with a history of breast cancer. Sarcoma centered over the muscle is considered. Recommend referral to ophthalmology. 2. The soft tissue lesion extends along the inferior and lateral margin of the right optic nerve without invasion of the nerve. 3. Advanced atrophy and diffuse white matter disease likely reflects the sequela of chronic microvascular ischemia. 4. Remote lacunar infarcts of the thalami and cerebellum bilaterally. 5. No acute intracranial abnormality to explain unresponsiveness. Electronically Signed   By: San Morelle M.D.   On: 07/25/2020  20:56   CT C-SPINE NO CHARGE  Result Date: 07/25/2020 CLINICAL DATA:  86 year old female with fall and neck  trauma. EXAM: CT CERVICAL SPINE WITHOUT CONTRAST TECHNIQUE: Multidetector CT imaging of the cervical spine was performed without intravenous contrast. Multiplanar CT image reconstructions were also generated. COMPARISON:  Cervical spine CT dated 01/08/2019. FINDINGS: Alignment: No acute subluxation. Skull base and vertebrae: No acute fracture. Osteopenia. Soft tissues and spinal canal: No prevertebral fluid or swelling. No visible canal hematoma. Disc levels: Multilevel degenerative changes with endplate irregularity and disc space narrowing. Upper chest: Negative. Other: Thyroid goiter. IMPRESSION: No acute/traumatic cervical spine pathology. Electronically Signed   By: Anner Crete M.D.   On: 07/25/2020 19:21   DG Chest Portable 1 View  Result Date: 07/25/2020 CLINICAL DATA:  86 year old female with fall. EXAM: PORTABLE CHEST 1 VIEW COMPARISON:  Chest radiograph dated 03/01/2016. FINDINGS: No focal consolidation, pleural effusion, or pneumothorax. The cardiac silhouette is within limits. Atherosclerotic calcification of the aorta. No acute osseous pathology. IMPRESSION: No active disease. Electronically Signed   By: Anner Crete M.D.   On: 07/25/2020 16:54    Procedures Procedures (including critical care time)  Medications Ordered in ED Medications  cefTRIAXone (ROCEPHIN) 1 g in sodium chloride 0.9 % 100 mL IVPB (has no administration in time range)  ondansetron (ZOFRAN) injection 4 mg (4 mg Intravenous Given 07/25/20 1700)  iohexol (OMNIPAQUE) 350 MG/ML injection 60 mL (60 mLs Intravenous Contrast Given 07/25/20 1847)    ED Course  I have reviewed the triage vital signs and the nursing notes.  Pertinent labs & imaging results that were available during my care of the patient were reviewed by me and considered in my medical decision making (see chart for details).    MDM Rules/Calculators/A&P                         Kristina Simmons is a 86 y.o. female with pertinent past medical  history of Parkinson's disease, fibromyalgia, h/o of breast cancer, arthritis presents the emergency department today for TIA EMS.  Patient is back to baseline with normal neuro exam.  However patient is slightly confused still, will obtain stroke work-up.  Stroke work-up acutely negative. No acute findings on CT cervical spine.  MRI with concerns for metatstaic disease. Pt also doesUTI, will start ceftriaxone at this time.  This could be causing patient's confusion.  930 spoke to Dr. Rory Percy, neurologist who wants MRI with contrast to evaluate for meningeal disease.  Does not think she needs further stroke work-up.  He is not concerned about metastatic disease, states she can have outpatient ophthalmology oncology follow-up however patient to come in for confusion in the setting of UTI.  945 Spoke to hospitalist, [facility], Dr. Tobie Poet who will accept the patient.   The patient appears reasonably stabilized for admission considering the current resources, flow, and capabilities available in the ED at this time, and I doubt any other [facility] requiring further screening and/or treatment in the ED prior to admission.  I discussed this case with my attending physician who cosigned this note including patient's presenting symptoms, physical exam, and planned diagnostics and interventions. Attending physician stated agreement with plan or made changes to plan which were implemented.   Attending physician assessed patient at bedside.   Final Clinical Impression(s) / ED Diagnoses Final diagnoses:  Acute cystitis with hematuria  TIA (transient ischemic attack)    Rx / DC Orders ED Discharge Orders  None       Alfredia Client, PA-C 07/25/20 2200    Drenda Freeze, MD 07/25/20 2211

## 2020-07-25 NOTE — ED Triage Notes (Signed)
Pt presents from [facility] for TIA. Facility staff called and reported pt unresponsive in dining area, LSW 1230. EMS arrived to pt arousable to painful stim, L facial droop, L arm weakness, L eye closed (family verified that L eye is normal for pt). While with EMS, improved lethargy, now responds to voice. Given 150cc by facilty through IV, BP improved from 87/47 to 99/50 after this intervention, arrives 108/60. H/o parkinsons, per son normal to get year/mo off by some, tremors, weakness. Possible recent gabapentin change. Fall is denied; however, pt endorses neck pain that started today

## 2020-07-26 ENCOUNTER — Other Ambulatory Visit: Payer: Self-pay

## 2020-07-26 ENCOUNTER — Observation Stay (HOSPITAL_BASED_OUTPATIENT_CLINIC_OR_DEPARTMENT_OTHER): Payer: PPO

## 2020-07-26 DIAGNOSIS — R4189 Other symptoms and signs involving cognitive functions and awareness: Secondary | ICD-10-CM | POA: Diagnosis not present

## 2020-07-26 DIAGNOSIS — D4989 Neoplasm of unspecified behavior of other specified sites: Secondary | ICD-10-CM | POA: Diagnosis not present

## 2020-07-26 DIAGNOSIS — R3989 Other symptoms and signs involving the genitourinary system: Secondary | ICD-10-CM | POA: Diagnosis not present

## 2020-07-26 DIAGNOSIS — G459 Transient cerebral ischemic attack, unspecified: Secondary | ICD-10-CM

## 2020-07-26 DIAGNOSIS — I361 Nonrheumatic tricuspid (valve) insufficiency: Secondary | ICD-10-CM | POA: Diagnosis not present

## 2020-07-26 DIAGNOSIS — R2681 Unsteadiness on feet: Secondary | ICD-10-CM

## 2020-07-26 DIAGNOSIS — R55 Syncope and collapse: Secondary | ICD-10-CM

## 2020-07-26 DIAGNOSIS — G20 Parkinson's disease: Secondary | ICD-10-CM

## 2020-07-26 LAB — CBC WITH DIFFERENTIAL/PLATELET
Abs Immature Granulocytes: 0.03 10*3/uL (ref 0.00–0.07)
Basophils Absolute: 0 10*3/uL (ref 0.0–0.1)
Basophils Relative: 1 %
Eosinophils Absolute: 0.1 10*3/uL (ref 0.0–0.5)
Eosinophils Relative: 2 %
HCT: 42.7 % (ref 36.0–46.0)
Hemoglobin: 13.8 g/dL (ref 12.0–15.0)
Immature Granulocytes: 0 %
Lymphocytes Relative: 30 %
Lymphs Abs: 2.3 10*3/uL (ref 0.7–4.0)
MCH: 32.2 pg (ref 26.0–34.0)
MCHC: 32.3 g/dL (ref 30.0–36.0)
MCV: 99.8 fL (ref 80.0–100.0)
Monocytes Absolute: 0.8 10*3/uL (ref 0.1–1.0)
Monocytes Relative: 10 %
Neutro Abs: 4.4 10*3/uL (ref 1.7–7.7)
Neutrophils Relative %: 57 %
Platelets: 203 10*3/uL (ref 150–400)
RBC: 4.28 MIL/uL (ref 3.87–5.11)
RDW: 11.9 % (ref 11.5–15.5)
WBC: 7.6 10*3/uL (ref 4.0–10.5)
nRBC: 0 % (ref 0.0–0.2)

## 2020-07-26 LAB — COMPREHENSIVE METABOLIC PANEL
ALT: 23 U/L (ref 0–44)
AST: 23 U/L (ref 15–41)
Albumin: 3.4 g/dL — ABNORMAL LOW (ref 3.5–5.0)
Alkaline Phosphatase: 68 U/L (ref 38–126)
Anion gap: 11 (ref 5–15)
BUN: 22 mg/dL (ref 8–23)
CO2: 21 mmol/L — ABNORMAL LOW (ref 22–32)
Calcium: 9.6 mg/dL (ref 8.9–10.3)
Chloride: 109 mmol/L (ref 98–111)
Creatinine, Ser: 0.86 mg/dL (ref 0.44–1.00)
GFR, Estimated: >60 mL/min (ref >60)
Glucose, Bld: 85 mg/dL (ref 70–99)
Potassium: 4.2 mmol/L (ref 3.5–5.1)
Sodium: 141 mmol/L (ref 135–145)
Total Bilirubin: 0.5 mg/dL (ref 0.3–1.2)
Total Protein: 6.3 g/dL — ABNORMAL LOW (ref 6.5–8.1)

## 2020-07-26 LAB — ECHOCARDIOGRAM COMPLETE
Area-P 1/2: 1.86 cm2
Height: 65 in
S' Lateral: 1.9 cm
Weight: 2589.08 oz

## 2020-07-26 LAB — HEMOGLOBIN A1C
Hgb A1c MFr Bld: 5.1 % (ref 4.8–5.6)
Mean Plasma Glucose: 99.67 mg/dL

## 2020-07-26 LAB — PHOSPHORUS: Phosphorus: 3.2 mg/dL (ref 2.5–4.6)

## 2020-07-26 LAB — LIPID PANEL
Cholesterol: 152 mg/dL (ref 0–200)
HDL: 32 mg/dL — ABNORMAL LOW (ref >40)
LDL Cholesterol: 87 mg/dL (ref 0–99)
Total CHOL/HDL Ratio: 4.8 RATIO
Triglycerides: 167 mg/dL — ABNORMAL HIGH (ref <150)
VLDL: 33 mg/dL (ref 0–40)

## 2020-07-26 LAB — TROPONIN I (HIGH SENSITIVITY)
Troponin I (High Sensitivity): 6 ng/L (ref <18)
Troponin I (High Sensitivity): 8 ng/L (ref <18)

## 2020-07-26 LAB — MAGNESIUM: Magnesium: 1.9 mg/dL (ref 1.7–2.4)

## 2020-07-26 LAB — RESPIRATORY PANEL BY RT PCR (FLU A&B, COVID)
Influenza A by PCR: NEGATIVE
Influenza B by PCR: NEGATIVE
SARS Coronavirus 2 by RT PCR: NEGATIVE

## 2020-07-26 LAB — MRSA PCR SCREENING: MRSA by PCR: NEGATIVE

## 2020-07-26 LAB — TSH: TSH: 0.759 u[IU]/mL (ref 0.350–4.500)

## 2020-07-26 MED ORDER — ATORVASTATIN CALCIUM 20 MG PO TABS: 20.0000 mg | ORAL_TABLET | Freq: Every day | ORAL | 0 refills | Status: AC

## 2020-07-26 MED ORDER — PERFLUTREN LIPID MICROSPHERE
1.0000 mL | INTRAVENOUS | Status: AC | PRN
  Administered 2020-07-26: 2 mL via INTRAVENOUS
  Filled 2020-07-26: qty 10

## 2020-07-26 MED ORDER — OXYCODONE HCL 5 MG PO TABS: 5.0000 mg | ORAL_TABLET | Freq: Four times a day (QID) | ORAL | Status: DC | PRN

## 2020-07-26 MED ORDER — LIDOCAINE 5 % EX PTCH
1.0000 | MEDICATED_PATCH | CUTANEOUS | Status: DC
  Administered 2020-07-26: 1 via TRANSDERMAL
  Filled 2020-07-26: qty 1

## 2020-07-26 MED ORDER — ATORVASTATIN CALCIUM 10 MG PO TABS
20.0000 mg | ORAL_TABLET | Freq: Every day | ORAL | Status: DC
  Administered 2020-07-26: 20 mg via ORAL
  Filled 2020-07-26: qty 2

## 2020-07-26 MED ORDER — LIDOCAINE 5 % EX PTCH: 1.0000 | MEDICATED_PATCH | CUTANEOUS | 0 refills | Status: AC

## 2020-07-26 MED ORDER — NITROFURANTOIN MONOHYD MACRO 100 MG PO CAPS: 100.0000 mg | ORAL_CAPSULE | Freq: Two times a day (BID) | ORAL | 0 refills | Status: AC

## 2020-07-26 NOTE — Progress Notes (Signed)
Pt Ortho VS were Lying 138/68, sitting 127/78 there were also two attempted to get urine cult. Called report to Nurse Dara at [facility].

## 2020-07-26 NOTE — Progress Notes (Addendum)
Phone call to [facility], left a message for intake coordinator-Kirsten regarding patient's return to the facility today.   [address] LCSW Transition of Care [phone]

## 2020-07-26 NOTE — Evaluation (Signed)
Physical Therapy Evaluation Patient Details Name: Kristina Simmons MRN: [id number] DOB: 01/28/1934 Today's Date: 07/26/2020   History of Present Illness  Pt is a 86 y.o. F with significant PMH of Parkinson disease, breast cancer, fibryomyalgia, L humerus fracture, who presents after an episode of decreased responsiveness. MRI showing Ill-defined soft tissue mass lesion involving the inferior right orbit and anterior aspect of the right lateral rectus muscle. Lesion  Clinical Impression  Prior to admission, pt resides at [facility]; she reports it has been ~1 year since she has been ambulatory and requires assist for ADL's. Pt A&Ox2 (not oriented to time); follows one step commands fairly consistently. Displays equal strength bilaterally, with generalized weakness noted. Pt requiring mod-max assist for bed mobility. Able to sit on edge of bed with single upper extremity support. Reports low back pain at rest and with movement. Recommend return to SNF upon discharge.     Follow Up Recommendations SNF    Equipment Recommendations  None recommended by PT    Recommendations for Other Services       Precautions / Restrictions Precautions Precautions: Fall Restrictions Weight Bearing Restrictions: No      Mobility  Bed Mobility Overal bed mobility: Needs Assistance Bed Mobility: Supine to Sit;Sit to Supine     Supine to sit: Max assist Sit to supine: Mod assist   General bed mobility comments: MaxA for supine > sit, with assist for BLE negotiation off edge of bed and trunk to upright. modA for transition back to supine with assist at [name] transfer comment: deferred  Ambulation/Gait                Stairs            Wheelchair Mobility    Modified Rankin (Stroke Patients Only)       Balance Overall balance assessment: Needs assistance Sitting-balance support: Feet supported;Single extremity supported Sitting  balance-Leahy Scale: Poor Sitting balance - Comments: reliant on single UE support, supervision for safety                                     Pertinent Vitals/Pain Pain Assessment: Faces Faces Pain Scale: Hurts little more Pain Location: head, back Pain Descriptors / Indicators: Grimacing;Guarding Pain Intervention(s): Limited activity within patient's tolerance;Monitored during session;Premedicated before session    Home Living Family/patient expects to be discharged to:: Skilled nursing facility                 Additional Comments: [name]    Prior Function Level of Independence: Needs assistance   Gait / Transfers Assistance Needed: Pt reports non ambulatory x 1 year  ADL's / Homemaking Assistance Needed: Pt reports assists with all ADL's        Hand Dominance        Extremity/Trunk Assessment   Upper Extremity Assessment Upper Extremity Assessment: RUE deficits/detail;LUE deficits/detail;Generalized weakness RUE Deficits / Details: Shoulder AROM to ~70 degrees LUE Deficits / Details: Shoulder AROM to ~70 degrees    Lower Extremity Assessment Lower Extremity Assessment: RLE deficits/detail;LLE deficits/detail RLE Deficits / Details: Grossly 2/5 LLE Deficits / Details: Grossly 2/5    Cervical / Trunk Assessment Cervical / Trunk Assessment: Kyphotic  Communication   Communication: No difficulties  Cognition Arousal/Alertness: Awake/alert Behavior During Therapy: WFL for tasks assessed/performed  Overall Cognitive Status: No family/caregiver present to determine baseline cognitive functioning                                 General Comments: Pt A&Ox2, not oriented to month. STM deficits noted i.e. unable to tell me name of SNF       General Comments      Exercises     Assessment/Plan    PT Assessment Patient needs continued PT services  PT Problem List Decreased strength;Decreased activity tolerance;Decreased  balance;Decreased mobility;Decreased cognition;Decreased safety awareness;Pain       PT Treatment Interventions Functional mobility training;Therapeutic activities;Therapeutic exercise;Balance training;Patient/family education;Wheelchair mobility training    PT Goals (Current goals can be found in the Care Plan section)  Acute Rehab PT Goals Patient Stated Goal: did not state PT Goal Formulation: Patient unable to participate in goal setting Time For Goal Achievement: 08/09/20 Potential to Achieve Goals: Fair    Frequency Min 2X/week   Barriers to discharge        Co-evaluation               AM-PAC PT "6 Clicks" Mobility  Outcome Measure Help needed turning from your back to your side while in a flat bed without using bedrails?: A Lot Help needed moving from lying on your back to sitting on the side of a flat bed without using bedrails?: Total Help needed moving to and from a bed to a chair (including a wheelchair)?: Total Help needed standing up from a chair using your arms (e.g., wheelchair or bedside chair)?: Total Help needed to walk in hospital room?: Total Help needed climbing 3-5 steps with a railing? : Total 6 Click Score: 7    End of Session   Activity Tolerance: Patient tolerated treatment well Patient left: in bed;with call bell/phone within reach;with bed alarm set Nurse Communication: Mobility status PT Visit Diagnosis: Pain;Other abnormalities of gait and mobility (R26.89);Muscle weakness (generalized) (M62.81) Pain - part of body:  (back)    Time: 1040-1055 PT Time Calculation (min) (ACUTE ONLY): 15 min   Charges:   PT Evaluation $PT Eval Moderate Complexity: 1 Mod          Kristina Simmons, PT, DPT Acute Rehabilitation Services Pager [phone] Office [phone]   Kristina Simmons 07/26/2020, 11:50 AM

## 2020-07-26 NOTE — TOC Progression Note (Signed)
Transition of Care [facility]) - Progression Note    Patient Details  Name: Kristina Simmons MRN: [id number] Date of Birth: 04-04-1934  Transition of Care [facility] CM/SW Contact  [address] [place], [place] Phone Number: 07/26/2020, 4:43 PM  Clinical Narrative:    Phone call to [place], [place]. Patient medically stable for discharge, awaiting call back from facility regarding transition back to [facility].   [address], LCSW Transition of Care [phone]         Expected Discharge Plan and Services           Expected Discharge Date: 07/26/20                                     Social Determinants of Health (SDOH) Interventions    Readmission Risk Interventions No flowsheet data found.

## 2020-07-26 NOTE — ED Notes (Signed)
Pt lying with both eyes closed pulling at her iv  She reports that its botheringher oriented skin warm and dry

## 2020-07-26 NOTE — ED Notes (Signed)
Report given  To rn on 2w

## 2020-07-26 NOTE — Progress Notes (Signed)
Physician Discharge Summary  Kristina Simmons OJJ:[id number] DOB: Sep 28, 1934 DOA: 07/25/2020  PCP: Merryl Hacker, No  Admit date: 07/25/2020 Discharge date: 07/26/2020  Admitted From: SNF Disposition: SNF  Recommendations for Outpatient Follow-up:  1. Follow up with PCP in 1-2 weeks 2. Follow up with Neurolgoy Dr. Leonie Man within 1-2 weeks 3. Follow up with Opthalmology Dr. Katy Fitch within 1 week; Please Call office to have appointment scheduled. Dr. Katy Fitch Aware 4. Please obtain CMP/CBC, Mag, Phos in one week 5. Please follow up on the following pending results: Urine Culture  Home Health: No Equipment/Devices: None    Discharge Condition: Stable CODE STATUS: DO NOT RESUSCITATE Diet recommendation:   Brief/Interim Summary: HPI per Dr. Rupert Stacks on 07/25/20 Kristina Simmons is a 86 y.o. female with medical history significantfor parkinson disease and is from nursing home facility.   She is awake and alert x4 (self, age, current location, year, and president is 'debatable'). She reports that she is in the hospital because they told me I was found unresponsive in the dining room. She reports the last thing she remembers is going into the dining room at the nursing facility and doesn't remember anything else. She states that at baseline she does not ambulate.  I spoke with son, Mr. Bibb and he states the nursing home facility says she was found unresponsive and breathing. Patient has been wheelchair bound (due to bilateral leg weakness since before nursing facility).   At bedside, ROS was negative for headache, vision changes (baseline she says her vision is poor), chest pain, shortness of breath, abdominal pain and urinary complaints. She reports she doesn't remember the last time she had a bowel movement. She endorses chronic back pain that is unchanged from baseline. She reports new neck pain x1 that is worse with cervical extension.  ED Course: discussed with ED provider. Neurology was  consulted and recommends MRI inpatient and outpatient follow-up for possible neoplasm and concerns for meningeal signs.  **Interim History She improved significantly and TIA and Syncope workup complete. Case was discussed with Neurology and Opthalmology. She appeared stable and her numbers improved.  Son was updated and he is agreeable with plan of care.  She will need to follow-up with PCP, neurology and ophthalmology outpatient setting.   Discharge Diagnoses:  Principal Problem:   TIA (transient ischemic attack) Active Problems:   Unstable gait   Parkinsonian tremor (HCC)  Unresponsive/mental status change - suspect Syncope as she is back to baseline - Neurology consulted by ED provider and recommended admit and recommend no further workup as work up was complete - CTA Head and Neck showed "No emergent large vessel occlusion. Narrowing of the distal right M1 segment measures approximately 50%. Mild atherosclerotic changes at the carotid bifurcations bilaterally without significant stenosis. Interval enlargement of right orbital mass, concerning for a orbital neoplasm. There is extension into the lateral rectus muscle and along the inferior and lateral aspect of the optic nerve on the right. This has been slowly growing since initially mentioned in 2017. Metastatic disease is considered less likely. MRI of the orbits without and with contrast would be useful for further evaluation and delineation. Advanced atrophy and white matter disease. Remote lacunar infarct in the posterior right cerebellum. Multinodular goiter. Aortic Atherosclerosis"  - MRI Brain w/o Contrast showed "Ill-defined soft tissue mass lesion involving the inferior right orbit and anterior aspect of the right lateral rectus muscle. Lesion has increased over time and is concerning for neoplasm. Metastatic disease is considered with a history  of breast cancer. Sarcoma centered over the muscle is considered. Recommend referral to  ophthalmology. The soft tissue lesion extends along the inferior and lateral margin of the right optic nerve without invasion of the nerve. Advanced atrophy and diffuse white matter disease likely reflects the sequela of chronic microvascular ischemia. Remote lacunar infarcts of the thalami and cerebellum bilaterally. No acute intracranial abnormality to explain unresponsiveness" -MRI Brain w/ Contrast showed "Homogeneous enhancement of the right inferior orbital lesion. The inferior rectus muscle is not seen separately. The lesion has increased in size over time is concerning for neoplasm metastatic disease is considered history of breast cancer. Sarcoma of the inferior rectus muscle is also considered. Recommend ophthalmology consult. No pathologic enhancement of the brain parenchyma." -Lipid panel done-total cholesterol/HDL ratio 4.8, cholesterol level 152, HDL of 32, LDL of 87, triglycerides 167, VLDL 33 -ECHOCardiogram done and showed "Left Ventricle: Midcavitary gradient. Peak velocity 2.12 m/s. Peak gradient 18 mmHg. Left ventricular ejection fraction, by estimation, is 65  to 70%. The left ventricle has normal function. The left ventricle has no regional wall motion abnormalities. Definity contrast agent was given IV to delineate the left ventricular endocardial borders. The left ventricular internal cavity size was normal in size. There is mild left ventricular hypertrophy. Left ventricular  diastolic parameters are consistent with Grade I diastolic dysfunction (impaired relaxation). Indeterminate filling pressures."  -Troponin I flat and Negative  -Orthostatic VS done and showed that she did not drop -C/w Fall and aspiration precautions -PT/OT recommending SNF -She appears to have improved. Son feels she is at baseline -Stable to D/C back to SNF and follow up with PCP and Neurology   Left Facial Droop and Left Arm Weakness, Suspect TIA -Improved and back to baseline -Workup complete as  above -Discussed with Neuro Dr. Leonie Man who feels that they don't need to see the patient. He recommends ASA and Statin at D/C with outpatient Neuro follow up  Neck Pain  -CT Spine Neck showed "No acute/traumatic cervical spine pathology." -Afebrile with no leukocytosis now and does not have any nuchal rigidity so did not clinically concern for meningeal issues or signs -We will try lidocaine patch and analgesics -Try K pad -Continue monitor and trend in the outpatient setting  Right Eye Inferior Orbital Lesion -Per MRI showed "The lesion has increased in size over time is concerning for neoplasm metastatic disease is considered history of breast cancer. Sarcoma of the inferior rectus muscle is also considered." -Spoke with ophthalmology Dr. Katy Fitch to see the patient in the outpatient setting early next week and advises the patient to have the SNF call for follow-up appointment.  Suspected UTI -patient denies dysuria, hematuria and increased frequency -However, given age and fragility she was initiated on treatment  -Blood cultures x2 are pending -She had a small leukocytosis on admission and was 10.9 is now trended down to 7.6 -Nitrofurantoin 100 mg BID for 3 days will be extended to 5 days -Urine Cx to be collected and sent off prior to D/C and can be followed at SNF  AKI  Mild dehydration -Mild with a BUN/creatinine of 25/1.10 -Now is improved and patient's BUNs/creatinine is now 22/0.86 -Dehydration is now improved -Avoid further nephrotoxic medications, contrast dyes, hypotension and renally dose medications  -Repeat CMP within 1 week  Hypertension -Resume home Amlodipine  Parkinson's Disease/Tremor -Outpatient follow up with Neurology  -C/w Primidone 50 mg po BID  Discharge Instructions  Discharge Instructions    (HEART FAILURE PATIENTS) Call MD:  Anytime you  have any of the following symptoms: 1) 3 pound weight gain in 24 hours or 5 pounds in 1 week 2) shortness of  breath, with or without a dry hacking cough 3) swelling in the hands, feet or stomach 4) if you have to sleep on extra pillows at night in order to breathe.   Complete by: As directed    Ambulatory referral to Neurology   Complete by: As directed    An appointment is requested in approximately: 2 weeks for TIA, Syncope   Call MD for:  difficulty breathing, headache or visual disturbances   Complete by: As directed    Call MD for:  extreme fatigue   Complete by: As directed    Call MD for:  hives   Complete by: As directed    Call MD for:  persistant dizziness or light-headedness   Complete by: As directed    Call MD for:  persistant nausea and vomiting   Complete by: As directed    Call MD for:  redness, tenderness, or signs of infection (pain, swelling, redness, odor or green/yellow discharge around incision site)   Complete by: As directed    Call MD for:  severe uncontrolled pain   Complete by: As directed    Call MD for:  temperature >100.4   Complete by: As directed    Diet - low sodium heart healthy   Complete by: As directed    Discharge instructions   Complete by: As directed    You were cared for by a hospitalist during your hospital stay. If you have any questions about your discharge medications or the care you received while you were in the hospital after you are discharged, you can call the unit and ask to speak with the hospitalist on call if the hospitalist that took care of you is not available. Once you are discharged, your primary care physician will handle any further medical issues. Please note that NO REFILLS for any discharge medications will be authorized once you are discharged, as it is imperative that you return to your primary care physician (or establish a relationship with a primary care physician if you do not have one) for your aftercare needs so that they can reassess your need for medications and monitor your lab values.  Follow up with PCP, Neurology, and  Opthamology. Take all medications as prescribed. If symptoms change or worsen please return to the ED for evaluation   Increase activity slowly   Complete by: As directed      Allergies as of 07/26/2020   No Known Allergies     Medication List    STOP taking these medications   metoprolol succinate 25 MG 24 hr tablet Commonly known as: TOPROL-XL     TAKE these medications   acetaminophen 500 MG tablet Commonly known as: TYLENOL Take 1 tablet (500 mg total) by mouth every 6 (six) hours as needed. What changed:   how much to take  when to take this   amLODipine 2.5 MG tablet Commonly known as: NORVASC Take 2.5 mg by mouth daily.   Artificial Tears 0.1-0.3 % Soln Generic drug: Dextran 70-Hypromellose Apply 1 drop to eye in the morning, at noon, in the evening, and at bedtime.   aspirin EC 81 MG tablet Take 81 mg by mouth at bedtime. Swallow whole.   atorvastatin 20 MG tablet Commonly known as: LIPITOR Take 1 tablet (20 mg total) by mouth daily.   calcium carbonate 1250 (500 Ca) MG  tablet Commonly known as: OS-CAL - dosed in mg of elemental calcium Take 1 tablet (500 mg of elemental calcium total) by mouth daily.   calcium carbonate 500 MG chewable tablet Commonly known as: TUMS - dosed in mg elemental calcium Chew 2 tablets by mouth daily.   diclofenac Sodium 1 % Gel Commonly known as: VOLTAREN Apply 2 g topically in the morning and at bedtime.   escitalopram 10 MG tablet Commonly known as: LEXAPRO Take 1 tablet (10 mg total) by mouth daily. What changed:   how much to take  Another medication with the same name was removed. Continue taking this medication, and follow the directions you see here.   Fish Oil 1000 MG Caps Take 1 capsule by mouth daily.   fluticasone 50 MCG/ACT nasal spray Commonly known as: FLONASE Place 1 spray into both nostrils every other day.   gabapentin 300 MG capsule Commonly known as: NEURONTIN Take 300 mg by mouth at  bedtime.   gabapentin 100 MG capsule Commonly known as: NEURONTIN Take 100 mg by mouth daily.   lidocaine 5 % Commonly known as: LIDODERM Place 1 patch onto the skin daily. Remove & Discard patch within 12 hours or as directed by MD Start taking on: July 27, 2020   melatonin 3 MG Tabs tablet Take 3 mg by mouth at bedtime.   nitrofurantoin (macrocrystal-monohydrate) 100 MG capsule Commonly known as: MACROBID Take 1 capsule (100 mg total) by mouth every 12 (twelve) hours for 5 days.   polyethylene glycol 17 g packet Commonly known as: MIRALAX / GLYCOLAX Take 17 g by mouth daily.   primidone 50 MG tablet Commonly known as: MYSOLINE Take 50 mg by mouth 2 (two) times a day.   Vitamin D (Ergocalciferol) 1.25 MG (50000 UNIT) Caps capsule Commonly known as: DRISDOL Take 50,000 Units by mouth every 28 (twenty-eight) days.       Follow-up Information    Garvin Fila, MD. Call.   Specialties: Neurology, Radiology Why: Follow up within 1-2 weeks for TIA and Syncope Contact information: [address] [place] [address] [phone]        Warden Fillers, MD. Call.   Specialty: Ophthalmology Why: Call office to schedule an appointment on Monday 07/28/20 Contact information: [address] [place] [address] [phone]              No Known Allergies  Consultations:  Discussed with Neurology  Discussed with Opthamology  Procedures/Studies: CT Angio Head W or Wo Contrast  Result Date: 07/25/2020 CLINICAL DATA:  Patient unresponsive dining area. Last seen well at 12:30 p.m. Left facial droop and left arm weakness. Patient's symptoms are improving. Neck pain. EXAM: CT ANGIOGRAPHY HEAD AND NECK TECHNIQUE: Multidetector CT imaging of the head and neck was performed using the standard protocol during bolus administration of intravenous contrast. Multiplanar CT image reconstructions and MIPs were obtained to evaluate the vascular  anatomy. Carotid stenosis measurements (when applicable) are obtained utilizing NASCET criteria, using the distal internal carotid diameter as the denominator. CONTRAST:  41mL OMNIPAQUE IOHEXOL 350 MG/ML SOLN COMPARISON:  CT head without contrast 07/11/19 FINDINGS: CT HEAD FINDINGS Brain: Advanced atrophy and white matter disease is present. No acute or focal cortical abnormalities are present. No significant extraaxial fluid collection is present. The ventricles are proportionate to the degree of atrophy. A remote lacunar infarct is present in the posterior right cerebellum. Vascular: No hyperdense vessel or unexpected calcification. Skull: Calvarium is intact. No focal  lytic or blastic lesions are present. No significant extracranial soft tissue lesion is present. Sinuses: The paranasal sinuses and mastoid air cells are clear. Orbits: Prominent soft tissue mass demonstrates interval increase in size since prior exam. This encompasses the inferior rectus muscle. There is extension into the lateral rectus muscle anteriorly and along the inferior and lateral aspect of the optic nerve on the right. CTA NECK FINDINGS Aortic arch: Atherosclerotic calcifications are present at the aortic arch. Great vessel origins are without stenosis. No aneurysm present. Right carotid system: Right common carotid artery is within limits. Mild atherosclerotic calcifications are present at the right carotid bifurcation. Cervical right ICA is within normal limits. Left carotid system: Atherosclerotic changes are present at the left carotid bifurcation. No significant stenosis is present. Cervical ICA is normal. Vertebral arteries: The vertebral arteries are codominant. Both vertebral arteries originate from the subclavian arteries without significant stenosis. Mild atherosclerotic calcifications are present. No stenosis is present in the neck. Skeleton: Facet degenerative changes are greatest the right at C4-5 and on the left at C3-4  alignment is anatomic. No focal lytic or blastic lesions are present. Other neck: Multinodular goiter is stable. No significant adenopathy is present. Salivary glands are within normal limits. No mucosal lesions present. Upper chest: Lung apices are clear. Review of the MIP images confirms the above findings CTA HEAD FINDINGS Anterior circulation: Internal carotid arteries are within normal limits from the skull base to the ICA termini. Narrowing of the distal right M1 segment is approximately 50%. MCA bifurcations intact. No other focal stenosis or occlusion is present. Mild distal narrowing is present. Posterior circulation: The right vertebral artery is dominant. PICA origins are visualized and within normal limits. Basilar artery is normal. Both posterior cerebral arteries originate from basilar tip. PCA branch vessels are within normal limits. Venous sinuses: The dural sinuses are patent. The straight sinus deep cerebral veins are intact. Cortical veins are unremarkable. Anatomic variants: None Review of the MIP images confirms the above findings IMPRESSION: 1. No emergent large vessel occlusion. 2. Narrowing of the distal right M1 segment measures approximately 50%. 3. Mild atherosclerotic changes at the carotid bifurcations bilaterally without significant stenosis. 4. Interval enlargement of right orbital mass, concerning for a orbital neoplasm. There is extension into the lateral rectus muscle and along the inferior and lateral aspect of the optic nerve on the right. This has been slowly growing since initially mentioned in 2017. Metastatic disease is considered less likely. MRI of the orbits without and with contrast would be useful for further evaluation and delineation. 5. Advanced atrophy and white matter disease. 6. Remote lacunar infarct in the posterior right cerebellum. 7. Multinodular goiter. 8. Aortic Atherosclerosis (ICD10-I70.0). Electronically Signed   By: San Morelle M.D.   On:  07/25/2020 20:21   CT Angio Neck W and/or Wo Contrast  Result Date: 07/25/2020 CLINICAL DATA:  Patient unresponsive dining area. Last seen well at 12:30 p.m. Left facial droop and left arm weakness. Patient's symptoms are improving. Neck pain. EXAM: CT ANGIOGRAPHY HEAD AND NECK TECHNIQUE: Multidetector CT imaging of the head and neck was performed using the standard protocol during bolus administration of intravenous contrast. Multiplanar CT image reconstructions and MIPs were obtained to evaluate the vascular anatomy. Carotid stenosis measurements (when applicable) are obtained utilizing NASCET criteria, using the distal internal carotid diameter as the denominator. CONTRAST:  16mL OMNIPAQUE IOHEXOL 350 MG/ML SOLN COMPARISON:  CT head without contrast 07/11/19 FINDINGS: CT HEAD FINDINGS Brain: Advanced atrophy and white matter disease  is present. No acute or focal cortical abnormalities are present. No significant extraaxial fluid collection is present. The ventricles are proportionate to the degree of atrophy. A remote lacunar infarct is present in the posterior right cerebellum. Vascular: No hyperdense vessel or unexpected calcification. Skull: Calvarium is intact. No focal lytic or blastic lesions are present. No significant extracranial soft tissue lesion is present. Sinuses: The paranasal sinuses and mastoid air cells are clear. Orbits: Prominent soft tissue mass demonstrates interval increase in size since prior exam. This encompasses the inferior rectus muscle. There is extension into the lateral rectus muscle anteriorly and along the inferior and lateral aspect of the optic nerve on the right. CTA NECK FINDINGS Aortic arch: Atherosclerotic calcifications are present at the aortic arch. Great vessel origins are without stenosis. No aneurysm present. Right carotid system: Right common carotid artery is within limits. Mild atherosclerotic calcifications are present at the right carotid bifurcation. Cervical  right ICA is within normal limits. Left carotid system: Atherosclerotic changes are present at the left carotid bifurcation. No significant stenosis is present. Cervical ICA is normal. Vertebral arteries: The vertebral arteries are codominant. Both vertebral arteries originate from the subclavian arteries without significant stenosis. Mild atherosclerotic calcifications are present. No stenosis is present in the neck. Skeleton: Facet degenerative changes are greatest the right at C4-5 and on the left at C3-4 alignment is anatomic. No focal lytic or blastic lesions are present. Other neck: Multinodular goiter is stable. No significant adenopathy is present. Salivary glands are within normal limits. No mucosal lesions present. Upper chest: Lung apices are clear. Review of the MIP images confirms the above findings CTA HEAD FINDINGS Anterior circulation: Internal carotid arteries are within normal limits from the skull base to the ICA termini. Narrowing of the distal right M1 segment is approximately 50%. MCA bifurcations intact. No other focal stenosis or occlusion is present. Mild distal narrowing is present. Posterior circulation: The right vertebral artery is dominant. PICA origins are visualized and within normal limits. Basilar artery is normal. Both posterior cerebral arteries originate from basilar tip. PCA branch vessels are within normal limits. Venous sinuses: The dural sinuses are patent. The straight sinus deep cerebral veins are intact. Cortical veins are unremarkable. Anatomic variants: None Review of the MIP images confirms the above findings IMPRESSION: 1. No emergent large vessel occlusion. 2. Narrowing of the distal right M1 segment measures approximately 50%. 3. Mild atherosclerotic changes at the carotid bifurcations bilaterally without significant stenosis. 4. Interval enlargement of right orbital mass, concerning for a orbital neoplasm. There is extension into the lateral rectus muscle and along  the inferior and lateral aspect of the optic nerve on the right. This has been slowly growing since initially mentioned in 2017. Metastatic disease is considered less likely. MRI of the orbits without and with contrast would be useful for further evaluation and delineation. 5. Advanced atrophy and white matter disease. 6. Remote lacunar infarct in the posterior right cerebellum. 7. Multinodular goiter. 8. Aortic Atherosclerosis (ICD10-I70.0). Electronically Signed   By: San Morelle M.D.   On: 07/25/2020 20:21   MR Brain Wo Contrast (neuro protocol)  Result Date: 07/25/2020 CLINICAL DATA:  Patient is reported as unresponsive at nursing facility. Symptoms have improved. EXAM: MRI HEAD WITHOUT CONTRAST TECHNIQUE: Multiplanar, multiecho pulse sequences of the brain and surrounding structures were obtained without intravenous contrast. COMPARISON:  CTA head and neck 07/25/2020. FINDINGS: Brain: The diffusion-weighted images demonstrate no acute or subacute infarction. Advanced atrophy and diffuse white matter disease is present.  Dilated perivascular spaces are present throughout the basal ganglia. Remote lacunar infarcts are present in the thalami. The internal auditory canals are within normal limits. Remote lacunar infarcts are present within the cerebellum bilaterally. Vascular: Flow is present in the major intracranial arteries. Skull and upper cervical spine: The craniocervical junction is normal. Upper cervical spine is within normal limits. Marrow signal is unremarkable. Sinuses/Orbits: Mild mucosal thickening is present in the anterior ethmoid air cells and inferior maxillary sinuses. The paranasal sinuses and mastoid air cells are otherwise clear. Ill-defined soft tissue mass lesion is present in the inferior right orbit. This encompasses right inferior rectus muscle. There is extension to the anterior aspect of the right lateral rectus muscle. Soft tissue extends along the inferior and lateral  margin of the right optic nerve without invasion of the nerve. Globe is displaced anteriorly, but otherwise within normal limits. IMPRESSION: 1. Ill-defined soft tissue mass lesion involving the inferior right orbit and anterior aspect of the right lateral rectus muscle. Lesion has increased over time and is concerning for neoplasm. Metastatic disease is considered with a history of breast cancer. Sarcoma centered over the muscle is considered. Recommend referral to ophthalmology. 2. The soft tissue lesion extends along the inferior and lateral margin of the right optic nerve without invasion of the nerve. 3. Advanced atrophy and diffuse white matter disease likely reflects the sequela of chronic microvascular ischemia. 4. Remote lacunar infarcts of the thalami and cerebellum bilaterally. 5. No acute intracranial abnormality to explain unresponsiveness. Electronically Signed   By: San Morelle M.D.   On: 07/25/2020 20:56   MR BRAIN W CONTRAST  Result Date: 07/25/2020 CLINICAL DATA:  TIA.  Right orbital lesion. EXAM: MRI HEAD WITH CONTRAST TECHNIQUE: Multiplanar, multiecho pulse sequences of the brain and surrounding structures were obtained with intravenous contrast. CONTRAST:  7.56mL GADAVIST GADOBUTROL 1 MMOL/ML IV SOLN COMPARISON:  MR head without contrast/8/21 FINDINGS: Brain: Postcontrast images brain demonstrate no pathologic enhancement brain parenchyma. Vascular: Normal vascular enhancement is present. Skull and upper cervical spine: The craniocervical junction is normal. Upper cervical spine is within normal limits. Marrow signal is unremarkable. Sinuses/Orbits: Normal mucosal enhancement is present within the sinuses. Homogeneous enhancement is present within the right inferior orbital lesion. The soft tissue enhancement extends to the inferior orbital rim anteriorly. Posterior extension does not reach the orbital apex. The inferior rectus muscle is not seen separately. Soft tissue is noted  adjacent to the optic nerve inferiorly and laterally. IMPRESSION: 1. Homogeneous enhancement of the right inferior orbital lesion. The inferior rectus muscle is not seen separately. The lesion has increased in size over time is concerning for neoplasm metastatic disease is considered history of breast cancer. Sarcoma of the inferior rectus muscle is also considered. Recommend ophthalmology consult. 2. No pathologic enhancement of the brain parenchyma. Electronically Signed   By: San Morelle M.D.   On: 07/25/2020 22:52   CT C-SPINE NO CHARGE  Result Date: 07/25/2020 CLINICAL DATA:  86 year old female with fall and neck trauma. EXAM: CT CERVICAL SPINE WITHOUT CONTRAST TECHNIQUE: Multidetector CT imaging of the cervical spine was performed without intravenous contrast. Multiplanar CT image reconstructions were also generated. COMPARISON:  Cervical spine CT dated 01/08/2019. FINDINGS: Alignment: No acute subluxation. Skull base and vertebrae: No acute fracture. Osteopenia. Soft tissues and spinal canal: No prevertebral fluid or swelling. No visible canal hematoma. Disc levels: Multilevel degenerative changes with endplate irregularity and disc space narrowing. Upper chest: Negative. Other: Thyroid goiter. IMPRESSION: No acute/traumatic cervical spine pathology. Electronically  Signed   By: Anner Crete M.D.   On: 07/25/2020 19:21   DG Chest Portable 1 View  Result Date: 07/25/2020 CLINICAL DATA:  86 year old female with fall. EXAM: PORTABLE CHEST 1 VIEW COMPARISON:  Chest radiograph dated 03/01/2016. FINDINGS: No focal consolidation, pleural effusion, or pneumothorax. The cardiac silhouette is within limits. Atherosclerotic calcification of the aorta. No acute osseous pathology. IMPRESSION: No active disease. Electronically Signed   By: Anner Crete M.D.   On: 07/25/2020 16:54   ECHOCARDIOGRAM COMPLETE  Result Date: 07/26/2020    ECHOCARDIOGRAM REPORT   Patient Name:   TERRINA DOCTER  Date of Exam: 07/26/2020 Medical Rec #:  [id number]           Height:       65.0 in Accession #:    [id number]          Weight:       161.8 lb Date of Birth:  [birth year]          BSA:          1.808 m Patient Age:    53 years            BP:           150/77 mmHg Patient Gender: F                   HR:           71 bpm. Exam Location:  Inpatient Procedure: 2D Echo and Intracardiac Opacification Agent Indications:    Syncope 780.2 / R55  History:        Patient has no prior history of Echocardiogram examinations.                 Unresponsive/mental status change - suspect TIA.  Sonographer:    Darlina Sicilian RDCS Referring Phys: [id number] Georgina Quint LATIF [place]  1. Midcavitary gradient. Peak velocity 2.12 m/s. Peak gradient 18 mmHg. Marland Kitchen Left ventricular ejection fraction, by estimation, is 65 to 70%. The left ventricle has normal function. The left ventricle has no regional wall motion abnormalities. There is mild left ventricular hypertrophy. Left ventricular diastolic parameters are consistent with Grade I diastolic dysfunction (impaired relaxation).  2. Right ventricular systolic function is normal. The right ventricular size is normal. There is normal pulmonary artery systolic pressure.  3. The mitral valve is normal in structure. Trivial mitral valve regurgitation. No evidence of mitral stenosis.  4. The aortic valve is normal in structure. There is mild calcification of the aortic valve. There is mild thickening of the aortic valve. Aortic valve regurgitation is not visualized. No aortic stenosis is present.  5. The inferior vena cava is normal in size with greater than 50% respiratory variability, suggesting right atrial pressure of 3 mmHg. FINDINGS  Left Ventricle: Midcavitary gradient. Peak velocity 2.12 m/s. Peak gradient 18 mmHg. Left ventricular ejection fraction, by estimation, is 65 to 70%. The left ventricle has normal function. The left ventricle has no regional wall motion abnormalities. Definity  contrast agent was given IV to delineate the left ventricular endocardial borders. The left ventricular internal cavity size was normal in size. There is mild left ventricular hypertrophy. Left ventricular diastolic parameters are consistent with Grade I diastolic dysfunction (impaired relaxation). Indeterminate filling pressures. Right Ventricle: The right ventricular size is normal. No increase in right ventricular wall thickness. Right ventricular systolic function is normal. There is normal pulmonary artery systolic pressure. The tricuspid regurgitant velocity is 1.99 m/s, and  with  an assumed right atrial pressure of 3 mmHg, the estimated right ventricular systolic pressure is 23.3 mmHg. Left Atrium: Left atrial size was normal in size. Right Atrium: Right atrial size was normal in size. Pericardium: There is no evidence of pericardial effusion. Mitral Valve: The mitral valve is normal in structure. Trivial mitral valve regurgitation. No evidence of mitral valve stenosis. Tricuspid Valve: The tricuspid valve is normal in structure. Tricuspid valve regurgitation is mild . No evidence of tricuspid stenosis. Aortic Valve: The aortic valve is normal in structure. There is mild calcification of the aortic valve. There is mild thickening of the aortic valve. Aortic valve regurgitation is not visualized. No aortic stenosis is present. Pulmonic Valve: The pulmonic valve was normal in structure. Pulmonic valve regurgitation is not visualized. No evidence of pulmonic stenosis. Aorta: The aortic root is normal in size and structure. Venous: The inferior vena cava is normal in size with greater than 50% respiratory variability, suggesting right atrial pressure of 3 mmHg. IAS/Shunts: No atrial level shunt detected by color flow Doppler.  LEFT VENTRICLE PLAX 2D LVIDd:         3.60 cm  Diastology LVIDs:         1.90 cm  LV e' medial:    5.43 cm/s LV PW:         0.60 cm  LV E/e' medial:  13.2 LV IVS:        0.99 cm  LV e'  lateral:   4.38 cm/s LVOT diam:     2.40 cm  LV E/e' lateral: 16.4 LV SV:         88 LV SV Index:   49 LVOT Area:     4.52 cm  RIGHT VENTRICLE RV S prime:     10.10 cm/s TAPSE (M-mode): 1.4 cm LEFT ATRIUM             Index LA diam:        2.70 cm 1.49 cm/m LA Vol (A2C):   27.2 ml 15.05 ml/m LA Vol (A4C):   19.1 ml 10.57 ml/m LA Biplane Vol: 22.8 ml 12.61 ml/m  AORTIC VALVE LVOT Vmax:   101.00 cm/s LVOT Vmean:  66.300 cm/s LVOT VTI:    0.195 m  AORTA Ao Root diam: 3.10 cm MITRAL VALVE                TRICUSPID VALVE MV Area (PHT): 1.86 cm     TR Peak grad:   15.8 mmHg MV Decel Time: 408 msec     TR Vmax:        199.00 cm/s MV E velocity: 71.70 cm/s MV A velocity: 110.00 cm/s  SHUNTS MV E/A ratio:  0.65         Systemic VTI:  0.20 m                             Systemic Diam: 2.40 cm Skeet Latch MD Electronically signed by Skeet Latch MD Signature Date/Time: 07/26/2020/1:40:07 PM    Final     ECHOCARDIOGRAM 07/26/20 IMPRESSIONS    1. Midcavitary gradient. Peak velocity 2.12 m/s. Peak gradient 18 mmHg. Marland Kitchen  Left ventricular ejection fraction, by estimation, is 65 to 70%. The left  ventricle has normal function. The left ventricle has no regional wall  motion abnormalities. There is  mild left ventricular hypertrophy. Left ventricular diastolic parameters  are consistent with Grade I diastolic dysfunction (impaired relaxation).  2. Right ventricular  systolic function is normal. The right ventricular  size is normal. There is normal pulmonary artery systolic pressure.  3. The mitral valve is normal in structure. Trivial mitral valve  regurgitation. No evidence of mitral stenosis.  4. The aortic valve is normal in structure. There is mild calcification  of the aortic valve. There is mild thickening of the aortic valve. Aortic  valve regurgitation is not visualized. No aortic stenosis is present.  5. The inferior vena cava is normal in size with greater than 50%  respiratory variability,  suggesting right atrial pressure of 3 mmHg.   FINDINGS  Left Ventricle: Midcavitary gradient. Peak velocity 2.12 m/s. Peak  gradient 18 mmHg. Left ventricular ejection fraction, by estimation, is 65  to 70%. The left ventricle has normal function. The left ventricle has no  regional wall motion abnormalities.  Definity contrast agent was given IV to delineate the left ventricular  endocardial borders. The left ventricular internal cavity size was normal  in size. There is mild left ventricular hypertrophy. Left ventricular  diastolic parameters are consistent  with Grade I diastolic dysfunction (impaired relaxation). Indeterminate  filling pressures.   Right Ventricle: The right ventricular size is normal. No increase in  right ventricular wall thickness. Right ventricular systolic function is  normal. There is normal pulmonary artery systolic pressure. The tricuspid  regurgitant velocity is 1.99 m/s, and  with an assumed right atrial pressure of 3 mmHg, the estimated right  ventricular systolic pressure is 94.3 mmHg.   Left Atrium: Left atrial size was normal in size.   Right Atrium: Right atrial size was normal in size.   Pericardium: There is no evidence of pericardial effusion.   Mitral Valve: The mitral valve is normal in structure. Trivial mitral  valve regurgitation. No evidence of mitral valve stenosis.   Tricuspid Valve: The tricuspid valve is normal in structure. Tricuspid  valve regurgitation is mild . No evidence of tricuspid stenosis.   Aortic Valve: The aortic valve is normal in structure. There is mild  calcification of the aortic valve. There is mild thickening of the aortic  valve. Aortic valve regurgitation is not visualized. No aortic stenosis is  present.   Pulmonic Valve: The pulmonic valve was normal in structure. Pulmonic valve  regurgitation is not visualized. No evidence of pulmonic stenosis.   Aorta: The aortic root is normal in size and structure.    Venous: The inferior vena cava is normal in size with greater than 50%  respiratory variability, suggesting right atrial pressure of 3 mmHg.   IAS/Shunts: No atrial level shunt detected by color flow Doppler.     LEFT VENTRICLE  PLAX 2D  LVIDd:     3.60 cm Diastology  LVIDs:     1.90 cm LV e' medial:  5.43 cm/s  LV PW:     0.60 cm LV E/e' medial: 13.2  LV IVS:    0.99 cm LV e' lateral:  4.38 cm/s  LVOT diam:   2.40 cm LV E/e' lateral: 16.4  LV SV:     88  LV SV Index:  49  LVOT Area:   4.52 cm     RIGHT VENTRICLE  RV S prime:   10.10 cm/s  TAPSE (M-mode): 1.4 cm   LEFT ATRIUM       Index  LA diam:    2.70 cm 1.49 cm/m  LA Vol (A2C):  27.2 ml 15.05 ml/m  LA Vol (A4C):  19.1 ml 10.57 ml/m  LA Biplane Vol:  22.8 ml 12.61 ml/m  AORTIC VALVE  LVOT Vmax:  101.00 cm/s  LVOT Vmean: 66.300 cm/s  LVOT VTI:  0.195 m    AORTA  Ao Root diam: 3.10 cm   MITRAL VALVE        TRICUSPID VALVE  MV Area (PHT): 1.86 cm   TR Peak grad:  15.8 mmHg  MV Decel Time: 408 msec   TR Vmax:    199.00 cm/s  MV E velocity: 71.70 cm/s  MV A velocity: 110.00 cm/s SHUNTS  MV E/A ratio: 0.65     Systemic VTI: 0.20 m               Systemic Diam: 2.40 cm   Subjective:   Discharge Exam: Vitals:   07/26/20 0542 07/26/20 0733  BP: (!) 153/72 (!) 150/77  Pulse: 80 71  Resp: (!) 23   Temp: 99.6 F (37.6 C) 99.6 F (37.6 C)  SpO2: 100%    Vitals:   07/26/20 0415 07/26/20 0506 07/26/20 0542 07/26/20 0733  BP:   (!) 153/72 (!) 150/77  Pulse:   80 71  Resp:   (!) 23   Temp:   99.6 F (37.6 C) 99.6 F (37.6 C)  TempSrc:   Oral   SpO2:   100%   Weight: 77.1 kg 73.4 kg    Height: 5\' 5"  (1.651 m) 5\' 5"  (1.651 m)     General: Pt is alert, awake, not in acute distress Cardiovascular: RRR, S1/S2 +, no rubs, no gallops Respiratory: CTA bilaterally, no wheezing, no rhonchi Abdominal: Soft, NT, ND,  bowel sounds + Extremities: no edema, no cyanosis  The results of significant diagnostics from this hospitalization (including imaging, microbiology, ancillary and laboratory) are listed below for reference.    Microbiology: Recent Results (from the past 240 hour(s))  Respiratory Panel by RT PCR (Flu A&B, Covid) - Nasopharyngeal Swab     Status: None   Collection Time: 07/25/20 10:50 PM   Specimen: Nasopharyngeal Swab  Result Value Ref Range Status   SARS Coronavirus 2 by RT PCR NEGATIVE NEGATIVE Final    Comment: (NOTE) SARS-CoV-2 target nucleic acids are NOT DETECTED.  The SARS-CoV-2 RNA is generally detectable in upper respiratoy specimens during the acute phase of infection. The lowest concentration of SARS-CoV-2 viral copies this assay can detect is 131 copies/mL. A negative result does not preclude SARS-Cov-2 infection and should not be used as the sole basis for treatment or other patient management decisions. A negative result may occur with  improper specimen collection/handling, submission of specimen other than nasopharyngeal swab, presence of viral mutation(s) within the areas targeted by this assay, and inadequate number of viral copies (<131 copies/mL). A negative result must be combined with clinical observations, patient history, and epidemiological information. The expected result is Negative.  Fact Sheet for Patients:  PinkCheek.be  Fact Sheet for Healthcare Providers:  GravelBags.it  This test is no t yet approved or cleared by the [place] FDA and  has been authorized for detection and/or diagnosis of SARS-CoV-2 by FDA under an Emergency Use Authorization (EUA). This EUA will remain  in effect (meaning this test can be used) for the duration of the COVID-19 declaration under Section 564(b)(1) of the Act, 21 U.S.C. section 360bbb-3(b)(1), unless the authorization is terminated or revoked  sooner.     Influenza A by PCR NEGATIVE NEGATIVE Final   Influenza B by PCR NEGATIVE NEGATIVE Final    Comment: (NOTE) The Xpert Xpress SARS-CoV-2/FLU/RSV assay is intended  as an aid in  the diagnosis of influenza from Nasopharyngeal swab specimens and  should not be used as a sole basis for treatment. Nasal washings and  aspirates are unacceptable for Xpert Xpress SARS-CoV-2/FLU/RSV  testing.  Fact Sheet for Patients: PinkCheek.be  Fact Sheet for Healthcare Providers: GravelBags.it  This test is not yet approved or cleared by the [place] FDA and  has been authorized for detection and/or diagnosis of SARS-CoV-2 by  FDA under an Emergency Use Authorization (EUA). This EUA will remain  in effect (meaning this test can be used) for the duration of the  Covid-19 declaration under Section 564(b)(1) of the Act, 21  U.S.C. section 360bbb-3(b)(1), unless the authorization is  terminated or revoked. Performed at [facility], [place] [address], [place], [place] [address]   MRSA PCR Screening     Status: None   Collection Time: 07/26/20  5:26 AM   Specimen: Nasal Mucosa; Nasopharyngeal  Result Value Ref Range Status   MRSA by PCR NEGATIVE NEGATIVE Final    Comment:        The GeneXpert MRSA Assay (FDA approved for NASAL specimens only), is one component of a comprehensive MRSA colonization surveillance program. It is not intended to diagnose MRSA infection nor to guide or monitor treatment for MRSA infections. Performed at [facility], [place] [address], [place], [place] [address]     Labs: BNP (last 3 results) No results for input(s): BNP in the last 8760 hours. Basic Metabolic Panel: Recent Labs  Lab 07/25/20 1610 07/25/20 1621 07/26/20 0852  NA 141 141 141  K 3.9 3.9 4.2  CL 107 107 109  CO2 22  --  21*  GLUCOSE 119* 116* 85  BUN 23 25* 22  CREATININE 1.17* 1.10* 0.86  CALCIUM 9.5  --  9.6   MG  --   --  1.9  PHOS  --   --  3.2   Liver Function Tests: Recent Labs  Lab 07/25/20 1610 07/26/20 0852  AST 22 23  ALT 23 23  ALKPHOS 67 68  BILITOT 0.3 0.5  PROT 6.3* 6.3*  ALBUMIN 3.5 3.4*   No results for input(s): LIPASE, AMYLASE in the last 168 hours. No results for input(s): AMMONIA in the last 168 hours. CBC: Recent Labs  Lab 07/25/20 1610 07/25/20 1621 07/26/20 0852  WBC 10.9*  --  7.6  NEUTROABS 8.1*  --  4.4  HGB 14.3 15.0 13.8  HCT 45.9 44.0 42.7  MCV 103.8*  --  99.8  PLT 203  --  203   Cardiac Enzymes: No results for input(s): CKTOTAL, CKMB, CKMBINDEX, TROPONINI in the last 168 hours. BNP: Invalid input(s): POCBNP CBG: Recent Labs  Lab 07/25/20 1548  GLUCAP 107*   D-Dimer No results for input(s): DDIMER in the last 72 hours. Hgb A1c Recent Labs    07/26/20 0504  HGBA1C 5.1   Lipid Profile Recent Labs    07/26/20 0504  CHOL 152  HDL 32*  LDLCALC 87  TRIG 167*  CHOLHDL 4.8   Thyroid function studies Recent Labs    07/26/20 0852  TSH 0.759   Anemia work up No results for input(s): VITAMINB12, FOLATE, FERRITIN, TIBC, IRON, RETICCTPCT in the last 72 hours. Urinalysis    Component Value Date/Time   COLORURINE YELLOW 07/25/2020 2000   APPEARANCEUR CLOUDY (A) 07/25/2020 2000   LABSPEC >1.046 (H) 07/25/2020 2000   PHURINE 5.0 07/25/2020 2000   GLUCOSEU NEGATIVE 07/25/2020 2000  HGBUR LARGE (A) 07/25/2020 2000   BILIRUBINUR NEGATIVE 07/25/2020 2000   KETONESUR NEGATIVE 07/25/2020 2000   PROTEINUR 30 (A) 07/25/2020 2000   NITRITE NEGATIVE 07/25/2020 2000   LEUKOCYTESUR MODERATE (A) 07/25/2020 2000   Sepsis Labs Invalid input(s): PROCALCITONIN,  WBC,  LACTICIDVEN Microbiology Recent Results (from the past 240 hour(s))  Respiratory Panel by RT PCR (Flu A&B, Covid) - Nasopharyngeal Swab     Status: None   Collection Time: 07/25/20 10:50 PM   Specimen: Nasopharyngeal Swab  Result Value Ref Range Status   SARS Coronavirus 2  by RT PCR NEGATIVE NEGATIVE Final    Comment: (NOTE) SARS-CoV-2 target nucleic acids are NOT DETECTED.  The SARS-CoV-2 RNA is generally detectable in upper respiratoy specimens during the acute phase of infection. The lowest concentration of SARS-CoV-2 viral copies this assay can detect is 131 copies/mL. A negative result does not preclude SARS-Cov-2 infection and should not be used as the sole basis for treatment or other patient management decisions. A negative result may occur with  improper specimen collection/handling, submission of specimen other than nasopharyngeal swab, presence of viral mutation(s) within the areas targeted by this assay, and inadequate number of viral copies (<131 copies/mL). A negative result must be combined with clinical observations, patient history, and epidemiological information. The expected result is Negative.  Fact Sheet for Patients:  PinkCheek.be  Fact Sheet for Healthcare Providers:  GravelBags.it  This test is no t yet approved or cleared by the [place] FDA and  has been authorized for detection and/or diagnosis of SARS-CoV-2 by FDA under an Emergency Use Authorization (EUA). This EUA will remain  in effect (meaning this test can be used) for the duration of the COVID-19 declaration under Section 564(b)(1) of the Act, 21 U.S.C. section 360bbb-3(b)(1), unless the authorization is terminated or revoked sooner.     Influenza A by PCR NEGATIVE NEGATIVE Final   Influenza B by PCR NEGATIVE NEGATIVE Final    Comment: (NOTE) The Xpert Xpress SARS-CoV-2/FLU/RSV assay is intended as an aid in  the diagnosis of influenza from Nasopharyngeal swab specimens and  should not be used as a sole basis for treatment. Nasal washings and  aspirates are unacceptable for Xpert Xpress SARS-CoV-2/FLU/RSV  testing.  Fact Sheet for Patients: PinkCheek.be  Fact Sheet  for Healthcare Providers: GravelBags.it  This test is not yet approved or cleared by the [place] FDA and  has been authorized for detection and/or diagnosis of SARS-CoV-2 by  FDA under an Emergency Use Authorization (EUA). This EUA will remain  in effect (meaning this test can be used) for the duration of the  Covid-19 declaration under Section 564(b)(1) of the Act, 21  U.S.C. section 360bbb-3(b)(1), unless the authorization is  terminated or revoked. Performed at [facility], [place] [address] [place], [place] [address]   MRSA PCR Screening     Status: None   Collection Time: 07/26/20  5:26 AM   Specimen: Nasal Mucosa; Nasopharyngeal  Result Value Ref Range Status   MRSA by PCR NEGATIVE NEGATIVE Final    Comment:        The GeneXpert MRSA Assay (FDA approved for NASAL specimens only), is one component of a comprehensive MRSA colonization surveillance program. It is not intended to diagnose MRSA infection nor to guide or monitor treatment for MRSA infections. Performed at [facility], [place] [address], [place], [place] [address]    Time coordinating discharge: 35 minutes  SIGNED:  Kerney Elbe, DO [facility] 07/26/2020, 2:29 PM Pager is on [place]  If 7PM-7AM, please contact night-coverage [email or web address]

## 2020-07-26 NOTE — ED Notes (Signed)
The pts pupils are unequal  The lt eyelid is almost closed the rt is larger but her  Eyelids droop

## 2020-07-26 NOTE — Progress Notes (Signed)
  Echocardiogram 2D Echocardiogram with definity has been performed.  Kristina Simmons 07/26/2020, 10:29 AM

## 2020-07-27 LAB — URINE CULTURE

## 2020-07-27 NOTE — Discharge Summary (Signed)
Physician Discharge Summary  Kristina Simmons HYW:[id number] DOB: 1934-07-11 DOA: 07/25/2020  PCP: Pcp, No  Admit date: 07/25/2020 Discharge date: 07/27/2020  Admitted From: SNF Disposition: SNF  Recommendations for Outpatient Follow-up:  1. Follow up with PCP in 1-2 weeks 2. Follow up with Neurolgoy Dr. Leonie Man within 1-2 weeks 3. Follow up with Opthalmology Dr. Katy Fitch within 1 week; Please Call office to have appointment scheduled. Dr. Katy Fitch Aware 4. Please obtain CMP/CBC, Mag, Phos in one week 5. Please follow up on the following pending results: Urine Culture  Home Health: No Equipment/Devices: None    Discharge Condition: Stable CODE STATUS: DO NOT RESUSCITATE Diet recommendation:   Brief/Interim Summary: HPI per Dr. Rupert Stacks on 07/25/20 Kristina Simmons is a 86 y.o. female with medical history significantfor parkinson disease and is from nursing home facility.   She is awake and alert x4 (self, age, current location, year, and president is 'debatable'). She reports that she is in the hospital because they told me I was found unresponsive in the dining room. She reports the last thing she remembers is going into the dining room at the nursing facility and doesn't remember anything else. She states that at baseline she does not ambulate.  I spoke with son, Mr. Kimmer and he states the nursing home facility says she was found unresponsive and breathing. Patient has been wheelchair bound (due to bilateral leg weakness since before nursing facility).   At bedside, ROS was negative for headache, vision changes (baseline she says her vision is poor), chest pain, shortness of breath, abdominal pain and urinary complaints. She reports she doesn't remember the last time she had a bowel movement. She endorses chronic back pain that is unchanged from baseline. She reports new neck pain x1 that is worse with cervical extension.  ED Course: discussed with ED provider. Neurology was  consulted and recommends MRI inpatient and outpatient follow-up for possible neoplasm and concerns for meningeal signs.  **Interim History She improved significantly and TIA and Syncope workup complete. Case was discussed with Neurology and Opthalmology. She appeared stable and her numbers improved.  Son was updated and he is agreeable with plan of care.  She will need to follow-up with PCP, neurology and ophthalmology outpatient setting.   Discharge Diagnoses:  Principal Problem:   TIA (transient ischemic attack) Active Problems:   Unstable gait   Parkinsonian tremor (HCC)  Unresponsive/mental status change - suspect Syncope as she is back to baseline - Neurology consulted by ED provider and recommended admit and recommend no further workup as work up was complete - CTA Head and Neck showed "No emergent large vessel occlusion. Narrowing of the distal right M1 segment measures approximately 50%. Mild atherosclerotic changes at the carotid bifurcations bilaterally without significant stenosis. Interval enlargement of right orbital mass, concerning for a orbital neoplasm. There is extension into the lateral rectus muscle and along the inferior and lateral aspect of the optic nerve on the right. This has been slowly growing since initially mentioned in 2017. Metastatic disease is considered less likely. MRI of the orbits without and with contrast would be useful for further evaluation and delineation. Advanced atrophy and white matter disease. Remote lacunar infarct in the posterior right cerebellum. Multinodular goiter. Aortic Atherosclerosis"  - MRI Brain w/o Contrast showed "Ill-defined soft tissue mass lesion involving the inferior right orbit and anterior aspect of the right lateral rectus muscle. Lesion has increased over time and is concerning for neoplasm. Metastatic disease is considered with a history  of breast cancer. Sarcoma centered over the muscle is considered. Recommend referral to  ophthalmology. The soft tissue lesion extends along the inferior and lateral margin of the right optic nerve without invasion of the nerve. Advanced atrophy and diffuse white matter disease likely reflects the sequela of chronic microvascular ischemia. Remote lacunar infarcts of the thalami and cerebellum bilaterally. No acute intracranial abnormality to explain unresponsiveness" -MRI Brain w/ Contrast showed "Homogeneous enhancement of the right inferior orbital lesion. The inferior rectus muscle is not seen separately. The lesion has increased in size over time is concerning for neoplasm metastatic disease is considered history of breast cancer. Sarcoma of the inferior rectus muscle is also considered. Recommend ophthalmology consult. No pathologic enhancement of the brain parenchyma." -Lipid panel done-total cholesterol/HDL ratio 4.8, cholesterol level 152, HDL of 32, LDL of 87, triglycerides 167, VLDL 33 -ECHOCardiogram done and showed "Left Ventricle: Midcavitary gradient. Peak velocity 2.12 m/s. Peak gradient 18 mmHg. Left ventricular ejection fraction, by estimation, is 65  to 70%. The left ventricle has normal function. The left ventricle has no regional wall motion abnormalities. Definity contrast agent was given IV to delineate the left ventricular endocardial borders. The left ventricular internal cavity size was normal in size. There is mild left ventricular hypertrophy. Left ventricular  diastolic parameters are consistent with Grade I diastolic dysfunction (impaired relaxation). Indeterminate filling pressures."  -Troponin I flat and Negative  -Orthostatic VS done and showed that she did not drop -C/w Fall and aspiration precautions -PT/OT recommending SNF -She appears to have improved. Son feels she is at baseline -Stable to D/C back to SNF and follow up with PCP and Neurology   Left Facial Droop and Left Arm Weakness, Suspect TIA -Improved and back to baseline -Workup complete as  above -Discussed with Neuro Dr. Leonie Man who feels that they don't need to see the patient. He recommends ASA and Statin at D/C with outpatient Neuro follow up  Neck Pain  -CT Spine Neck showed "No acute/traumatic cervical spine pathology." -Afebrile with no leukocytosis now and does not have any nuchal rigidity so did not clinically concern for meningeal issues or signs -We will try lidocaine patch and analgesics -Try K pad -Continue monitor and trend in the outpatient setting  Right Eye Inferior Orbital Lesion -Per MRI showed "The lesion has increased in size over time is concerning for neoplasm metastatic disease is considered history of breast cancer. Sarcoma of the inferior rectus muscle is also considered." -Spoke with ophthalmology Dr. Katy Fitch to see the patient in the outpatient setting early next week and advises the patient to have the SNF call for follow-up appointment.  Suspected UTI -patient denies dysuria, hematuria and increased frequency -However, given age and fragility she was initiated on treatment  -Blood cultures x2 are pending -She had a small leukocytosis on admission and was 10.9 is now trended down to 7.6 -Nitrofurantoin 100 mg BID for 3 days will be extended to 5 days -Urine Cx to be collected and sent off prior to D/C and can be followed at SNF  AKI  Mild dehydration -Mild with a BUN/creatinine of 25/1.10 -Now is improved and patient's BUNs/creatinine is now 22/0.86 -Dehydration is now improved -Avoid further nephrotoxic medications, contrast dyes, hypotension and renally dose medications  -Repeat CMP within 1 week  Hypertension -Resume home Amlodipine  Parkinson's Disease/Tremor -Outpatient follow up with Neurology  -C/w Primidone 50 mg po BID  Discharge Instructions  Discharge Instructions    (HEART FAILURE PATIENTS) Call MD:  Anytime you  have any of the following symptoms: 1) 3 pound weight gain in 24 hours or 5 pounds in 1 week 2) shortness of  breath, with or without a dry hacking cough 3) swelling in the hands, feet or stomach 4) if you have to sleep on extra pillows at night in order to breathe.   Complete by: As directed    Ambulatory referral to Neurology   Complete by: As directed    An appointment is requested in approximately: 2 weeks for TIA, Syncope   Call MD for:  difficulty breathing, headache or visual disturbances   Complete by: As directed    Call MD for:  extreme fatigue   Complete by: As directed    Call MD for:  hives   Complete by: As directed    Call MD for:  persistant dizziness or light-headedness   Complete by: As directed    Call MD for:  persistant nausea and vomiting   Complete by: As directed    Call MD for:  redness, tenderness, or signs of infection (pain, swelling, redness, odor or green/yellow discharge around incision site)   Complete by: As directed    Call MD for:  severe uncontrolled pain   Complete by: As directed    Call MD for:  temperature >100.4   Complete by: As directed    Diet - low sodium heart healthy   Complete by: As directed    Discharge instructions   Complete by: As directed    You were cared for by a hospitalist during your hospital stay. If you have any questions about your discharge medications or the care you received while you were in the hospital after you are discharged, you can call the unit and ask to speak with the hospitalist on call if the hospitalist that took care of you is not available. Once you are discharged, your primary care physician will handle any further medical issues. Please note that NO REFILLS for any discharge medications will be authorized once you are discharged, as it is imperative that you return to your primary care physician (or establish a relationship with a primary care physician if you do not have one) for your aftercare needs so that they can reassess your need for medications and monitor your lab values.  Follow up with PCP, Neurology, and  Opthamology. Take all medications as prescribed. If symptoms change or worsen please return to the ED for evaluation   Increase activity slowly   Complete by: As directed      Allergies as of 07/26/2020   No Known Allergies     Medication List    STOP taking these medications   metoprolol succinate 25 MG 24 hr tablet Commonly known as: TOPROL-XL     TAKE these medications   acetaminophen 500 MG tablet Commonly known as: TYLENOL Take 1 tablet (500 mg total) by mouth every 6 (six) hours as needed. What changed:   how much to take  when to take this   amLODipine 2.5 MG tablet Commonly known as: NORVASC Take 2.5 mg by mouth daily.   Artificial Tears 0.1-0.3 % Soln Generic drug: Dextran 70-Hypromellose Apply 1 drop to eye in the morning, at noon, in the evening, and at bedtime.   aspirin EC 81 MG tablet Take 81 mg by mouth at bedtime. Swallow whole.   atorvastatin 20 MG tablet Commonly known as: LIPITOR Take 1 tablet (20 mg total) by mouth daily.   calcium carbonate 1250 (500 Ca) MG  tablet Commonly known as: OS-CAL - dosed in mg of elemental calcium Take 1 tablet (500 mg of elemental calcium total) by mouth daily.   calcium carbonate 500 MG chewable tablet Commonly known as: TUMS - dosed in mg elemental calcium Chew 2 tablets by mouth daily.   diclofenac Sodium 1 % Gel Commonly known as: VOLTAREN Apply 2 g topically in the morning and at bedtime.   escitalopram 10 MG tablet Commonly known as: LEXAPRO Take 1 tablet (10 mg total) by mouth daily. What changed:   how much to take  Another medication with the same name was removed. Continue taking this medication, and follow the directions you see here.   Fish Oil 1000 MG Caps Take 1 capsule by mouth daily.   fluticasone 50 MCG/ACT nasal spray Commonly known as: FLONASE Place 1 spray into both nostrils every other day.   gabapentin 300 MG capsule Commonly known as: NEURONTIN Take 300 mg by mouth at  bedtime.   gabapentin 100 MG capsule Commonly known as: NEURONTIN Take 100 mg by mouth daily.   lidocaine 5 % Commonly known as: LIDODERM Place 1 patch onto the skin daily. Remove & Discard patch within 12 hours or as directed by MD   melatonin 3 MG Tabs tablet Take 3 mg by mouth at bedtime.   nitrofurantoin (macrocrystal-monohydrate) 100 MG capsule Commonly known as: MACROBID Take 1 capsule (100 mg total) by mouth every 12 (twelve) hours for 5 days.   polyethylene glycol 17 g packet Commonly known as: MIRALAX / GLYCOLAX Take 17 g by mouth daily.   primidone 50 MG tablet Commonly known as: MYSOLINE Take 50 mg by mouth 2 (two) times a day.   Vitamin D (Ergocalciferol) 1.25 MG (50000 UNIT) Caps capsule Commonly known as: DRISDOL Take 50,000 Units by mouth every 28 (twenty-eight) days.       Follow-up Information    Garvin Fila, MD. Call.   Specialties: Neurology, Radiology Why: Follow up within 1-2 weeks for TIA and Syncope Contact information: [address] [place] [address] [phone]        Warden Fillers, MD. Call.   Specialty: Ophthalmology Why: Call office to schedule an appointment on Monday 07/28/20 Contact information: [address] [place] [address] [phone]              No Known Allergies  Consultations:  Discussed with Neurology  Discussed with Opthamology  Procedures/Studies: CT Angio Head W or Wo Contrast  Result Date: 07/25/2020 CLINICAL DATA:  Patient unresponsive dining area. Last seen well at 12:30 p.m. Left facial droop and left arm weakness. Patient's symptoms are improving. Neck pain. EXAM: CT ANGIOGRAPHY HEAD AND NECK TECHNIQUE: Multidetector CT imaging of the head and neck was performed using the standard protocol during bolus administration of intravenous contrast. Multiplanar CT image reconstructions and MIPs were obtained to evaluate the vascular anatomy. Carotid stenosis  measurements (when applicable) are obtained utilizing NASCET criteria, using the distal internal carotid diameter as the denominator. CONTRAST:  20mL OMNIPAQUE IOHEXOL 350 MG/ML SOLN COMPARISON:  CT head without contrast 07/11/19 FINDINGS: CT HEAD FINDINGS Brain: Advanced atrophy and white matter disease is present. No acute or focal cortical abnormalities are present. No significant extraaxial fluid collection is present. The ventricles are proportionate to the degree of atrophy. A remote lacunar infarct is present in the posterior right cerebellum. Vascular: No hyperdense vessel or unexpected calcification. Skull: Calvarium is intact. No focal lytic or blastic lesions are present.  No significant extracranial soft tissue lesion is present. Sinuses: The paranasal sinuses and mastoid air cells are clear. Orbits: Prominent soft tissue mass demonstrates interval increase in size since prior exam. This encompasses the inferior rectus muscle. There is extension into the lateral rectus muscle anteriorly and along the inferior and lateral aspect of the optic nerve on the right. CTA NECK FINDINGS Aortic arch: Atherosclerotic calcifications are present at the aortic arch. Great vessel origins are without stenosis. No aneurysm present. Right carotid system: Right common carotid artery is within limits. Mild atherosclerotic calcifications are present at the right carotid bifurcation. Cervical right ICA is within normal limits. Left carotid system: Atherosclerotic changes are present at the left carotid bifurcation. No significant stenosis is present. Cervical ICA is normal. Vertebral arteries: The vertebral arteries are codominant. Both vertebral arteries originate from the subclavian arteries without significant stenosis. Mild atherosclerotic calcifications are present. No stenosis is present in the neck. Skeleton: Facet degenerative changes are greatest the right at C4-5 and on the left at C3-4 alignment is anatomic. No focal  lytic or blastic lesions are present. Other neck: Multinodular goiter is stable. No significant adenopathy is present. Salivary glands are within normal limits. No mucosal lesions present. Upper chest: Lung apices are clear. Review of the MIP images confirms the above findings CTA HEAD FINDINGS Anterior circulation: Internal carotid arteries are within normal limits from the skull base to the ICA termini. Narrowing of the distal right M1 segment is approximately 50%. MCA bifurcations intact. No other focal stenosis or occlusion is present. Mild distal narrowing is present. Posterior circulation: The right vertebral artery is dominant. PICA origins are visualized and within normal limits. Basilar artery is normal. Both posterior cerebral arteries originate from basilar tip. PCA branch vessels are within normal limits. Venous sinuses: The dural sinuses are patent. The straight sinus deep cerebral veins are intact. Cortical veins are unremarkable. Anatomic variants: None Review of the MIP images confirms the above findings IMPRESSION: 1. No emergent large vessel occlusion. 2. Narrowing of the distal right M1 segment measures approximately 50%. 3. Mild atherosclerotic changes at the carotid bifurcations bilaterally without significant stenosis. 4. Interval enlargement of right orbital mass, concerning for a orbital neoplasm. There is extension into the lateral rectus muscle and along the inferior and lateral aspect of the optic nerve on the right. This has been slowly growing since initially mentioned in 2017. Metastatic disease is considered less likely. MRI of the orbits without and with contrast would be useful for further evaluation and delineation. 5. Advanced atrophy and white matter disease. 6. Remote lacunar infarct in the posterior right cerebellum. 7. Multinodular goiter. 8. Aortic Atherosclerosis (ICD10-I70.0). Electronically Signed   By: San Morelle M.D.   On: 07/25/2020 20:21   CT Angio Neck W  and/or Wo Contrast  Result Date: 07/25/2020 CLINICAL DATA:  Patient unresponsive dining area. Last seen well at 12:30 p.m. Left facial droop and left arm weakness. Patient's symptoms are improving. Neck pain. EXAM: CT ANGIOGRAPHY HEAD AND NECK TECHNIQUE: Multidetector CT imaging of the head and neck was performed using the standard protocol during bolus administration of intravenous contrast. Multiplanar CT image reconstructions and MIPs were obtained to evaluate the vascular anatomy. Carotid stenosis measurements (when applicable) are obtained utilizing NASCET criteria, using the distal internal carotid diameter as the denominator. CONTRAST:  66mL OMNIPAQUE IOHEXOL 350 MG/ML SOLN COMPARISON:  CT head without contrast 07/11/19 FINDINGS: CT HEAD FINDINGS Brain: Advanced atrophy and white matter disease is present. No acute or focal  cortical abnormalities are present. No significant extraaxial fluid collection is present. The ventricles are proportionate to the degree of atrophy. A remote lacunar infarct is present in the posterior right cerebellum. Vascular: No hyperdense vessel or unexpected calcification. Skull: Calvarium is intact. No focal lytic or blastic lesions are present. No significant extracranial soft tissue lesion is present. Sinuses: The paranasal sinuses and mastoid air cells are clear. Orbits: Prominent soft tissue mass demonstrates interval increase in size since prior exam. This encompasses the inferior rectus muscle. There is extension into the lateral rectus muscle anteriorly and along the inferior and lateral aspect of the optic nerve on the right. CTA NECK FINDINGS Aortic arch: Atherosclerotic calcifications are present at the aortic arch. Great vessel origins are without stenosis. No aneurysm present. Right carotid system: Right common carotid artery is within limits. Mild atherosclerotic calcifications are present at the right carotid bifurcation. Cervical right ICA is within normal limits.  Left carotid system: Atherosclerotic changes are present at the left carotid bifurcation. No significant stenosis is present. Cervical ICA is normal. Vertebral arteries: The vertebral arteries are codominant. Both vertebral arteries originate from the subclavian arteries without significant stenosis. Mild atherosclerotic calcifications are present. No stenosis is present in the neck. Skeleton: Facet degenerative changes are greatest the right at C4-5 and on the left at C3-4 alignment is anatomic. No focal lytic or blastic lesions are present. Other neck: Multinodular goiter is stable. No significant adenopathy is present. Salivary glands are within normal limits. No mucosal lesions present. Upper chest: Lung apices are clear. Review of the MIP images confirms the above findings CTA HEAD FINDINGS Anterior circulation: Internal carotid arteries are within normal limits from the skull base to the ICA termini. Narrowing of the distal right M1 segment is approximately 50%. MCA bifurcations intact. No other focal stenosis or occlusion is present. Mild distal narrowing is present. Posterior circulation: The right vertebral artery is dominant. PICA origins are visualized and within normal limits. Basilar artery is normal. Both posterior cerebral arteries originate from basilar tip. PCA branch vessels are within normal limits. Venous sinuses: The dural sinuses are patent. The straight sinus deep cerebral veins are intact. Cortical veins are unremarkable. Anatomic variants: None Review of the MIP images confirms the above findings IMPRESSION: 1. No emergent large vessel occlusion. 2. Narrowing of the distal right M1 segment measures approximately 50%. 3. Mild atherosclerotic changes at the carotid bifurcations bilaterally without significant stenosis. 4. Interval enlargement of right orbital mass, concerning for a orbital neoplasm. There is extension into the lateral rectus muscle and along the inferior and lateral aspect of  the optic nerve on the right. This has been slowly growing since initially mentioned in 2017. Metastatic disease is considered less likely. MRI of the orbits without and with contrast would be useful for further evaluation and delineation. 5. Advanced atrophy and white matter disease. 6. Remote lacunar infarct in the posterior right cerebellum. 7. Multinodular goiter. 8. Aortic Atherosclerosis (ICD10-I70.0). Electronically Signed   By: San Morelle M.D.   On: 07/25/2020 20:21   MR Brain Wo Contrast (neuro protocol)  Result Date: 07/25/2020 CLINICAL DATA:  Patient is reported as unresponsive at nursing facility. Symptoms have improved. EXAM: MRI HEAD WITHOUT CONTRAST TECHNIQUE: Multiplanar, multiecho pulse sequences of the brain and surrounding structures were obtained without intravenous contrast. COMPARISON:  CTA head and neck 07/25/2020. FINDINGS: Brain: The diffusion-weighted images demonstrate no acute or subacute infarction. Advanced atrophy and diffuse white matter disease is present. Dilated perivascular spaces are present throughout  the basal ganglia. Remote lacunar infarcts are present in the thalami. The internal auditory canals are within normal limits. Remote lacunar infarcts are present within the cerebellum bilaterally. Vascular: Flow is present in the major intracranial arteries. Skull and upper cervical spine: The craniocervical junction is normal. Upper cervical spine is within normal limits. Marrow signal is unremarkable. Sinuses/Orbits: Mild mucosal thickening is present in the anterior ethmoid air cells and inferior maxillary sinuses. The paranasal sinuses and mastoid air cells are otherwise clear. Ill-defined soft tissue mass lesion is present in the inferior right orbit. This encompasses right inferior rectus muscle. There is extension to the anterior aspect of the right lateral rectus muscle. Soft tissue extends along the inferior and lateral margin of the right optic nerve without  invasion of the nerve. Globe is displaced anteriorly, but otherwise within normal limits. IMPRESSION: 1. Ill-defined soft tissue mass lesion involving the inferior right orbit and anterior aspect of the right lateral rectus muscle. Lesion has increased over time and is concerning for neoplasm. Metastatic disease is considered with a history of breast cancer. Sarcoma centered over the muscle is considered. Recommend referral to ophthalmology. 2. The soft tissue lesion extends along the inferior and lateral margin of the right optic nerve without invasion of the nerve. 3. Advanced atrophy and diffuse white matter disease likely reflects the sequela of chronic microvascular ischemia. 4. Remote lacunar infarcts of the thalami and cerebellum bilaterally. 5. No acute intracranial abnormality to explain unresponsiveness. Electronically Signed   By: San Morelle M.D.   On: 07/25/2020 20:56   MR BRAIN W CONTRAST  Result Date: 07/25/2020 CLINICAL DATA:  TIA.  Right orbital lesion. EXAM: MRI HEAD WITH CONTRAST TECHNIQUE: Multiplanar, multiecho pulse sequences of the brain and surrounding structures were obtained with intravenous contrast. CONTRAST:  7.90mL GADAVIST GADOBUTROL 1 MMOL/ML IV SOLN COMPARISON:  MR head without contrast/8/21 FINDINGS: Brain: Postcontrast images brain demonstrate no pathologic enhancement brain parenchyma. Vascular: Normal vascular enhancement is present. Skull and upper cervical spine: The craniocervical junction is normal. Upper cervical spine is within normal limits. Marrow signal is unremarkable. Sinuses/Orbits: Normal mucosal enhancement is present within the sinuses. Homogeneous enhancement is present within the right inferior orbital lesion. The soft tissue enhancement extends to the inferior orbital rim anteriorly. Posterior extension does not reach the orbital apex. The inferior rectus muscle is not seen separately. Soft tissue is noted adjacent to the optic nerve inferiorly and  laterally. IMPRESSION: 1. Homogeneous enhancement of the right inferior orbital lesion. The inferior rectus muscle is not seen separately. The lesion has increased in size over time is concerning for neoplasm metastatic disease is considered history of breast cancer. Sarcoma of the inferior rectus muscle is also considered. Recommend ophthalmology consult. 2. No pathologic enhancement of the brain parenchyma. Electronically Signed   By: San Morelle M.D.   On: 07/25/2020 22:52   CT C-SPINE NO CHARGE  Result Date: 07/25/2020 CLINICAL DATA:  86 year old female with fall and neck trauma. EXAM: CT CERVICAL SPINE WITHOUT CONTRAST TECHNIQUE: Multidetector CT imaging of the cervical spine was performed without intravenous contrast. Multiplanar CT image reconstructions were also generated. COMPARISON:  Cervical spine CT dated 01/08/2019. FINDINGS: Alignment: No acute subluxation. Skull base and vertebrae: No acute fracture. Osteopenia. Soft tissues and spinal canal: No prevertebral fluid or swelling. No visible canal hematoma. Disc levels: Multilevel degenerative changes with endplate irregularity and disc space narrowing. Upper chest: Negative. Other: Thyroid goiter. IMPRESSION: No acute/traumatic cervical spine pathology. Electronically Signed   By: Milas Hock  Radparvar M.D.   On: 07/25/2020 19:21   DG Chest Portable 1 View  Result Date: 07/25/2020 CLINICAL DATA:  86 year old female with fall. EXAM: PORTABLE CHEST 1 VIEW COMPARISON:  Chest radiograph dated 03/01/2016. FINDINGS: No focal consolidation, pleural effusion, or pneumothorax. The cardiac silhouette is within limits. Atherosclerotic calcification of the aorta. No acute osseous pathology. IMPRESSION: No active disease. Electronically Signed   By: Anner Crete M.D.   On: 07/25/2020 16:54   ECHOCARDIOGRAM COMPLETE  Result Date: 07/26/2020    ECHOCARDIOGRAM REPORT   Patient Name:   GIAH FICKETT Date of Exam: 07/26/2020 Medical Rec #:   [id number]           Height:       65.0 in Accession #:    [id number]          Weight:       161.8 lb Date of Birth:  1933/10/20          BSA:          1.808 m Patient Age:    21 years            BP:           150/77 mmHg Patient Gender: F                   HR:           71 bpm. Exam Location:  Inpatient Procedure: 2D Echo and Intracardiac Opacification Agent Indications:    Syncope 780.2 / R55  History:        Patient has no prior history of Echocardiogram examinations.                 Unresponsive/mental status change - suspect TIA.  Sonographer:    Darlina Sicilian RDCS Referring Phys: [id number] Georgina Quint LATIF [place]  1. Midcavitary gradient. Peak velocity 2.12 m/s. Peak gradient 18 mmHg. Marland Kitchen Left ventricular ejection fraction, by estimation, is 65 to 70%. The left ventricle has normal function. The left ventricle has no regional wall motion abnormalities. There is mild left ventricular hypertrophy. Left ventricular diastolic parameters are consistent with Grade I diastolic dysfunction (impaired relaxation).  2. Right ventricular systolic function is normal. The right ventricular size is normal. There is normal pulmonary artery systolic pressure.  3. The mitral valve is normal in structure. Trivial mitral valve regurgitation. No evidence of mitral stenosis.  4. The aortic valve is normal in structure. There is mild calcification of the aortic valve. There is mild thickening of the aortic valve. Aortic valve regurgitation is not visualized. No aortic stenosis is present.  5. The inferior vena cava is normal in size with greater than 50% respiratory variability, suggesting right atrial pressure of 3 mmHg. FINDINGS  Left Ventricle: Midcavitary gradient. Peak velocity 2.12 m/s. Peak gradient 18 mmHg. Left ventricular ejection fraction, by estimation, is 65 to 70%. The left ventricle has normal function. The left ventricle has no regional wall motion abnormalities. Definity contrast agent was given IV to delineate  the left ventricular endocardial borders. The left ventricular internal cavity size was normal in size. There is mild left ventricular hypertrophy. Left ventricular diastolic parameters are consistent with Grade I diastolic dysfunction (impaired relaxation). Indeterminate filling pressures. Right Ventricle: The right ventricular size is normal. No increase in right ventricular wall thickness. Right ventricular systolic function is normal. There is normal pulmonary artery systolic pressure. The tricuspid regurgitant velocity is 1.99 m/s, and  with an assumed right atrial pressure of  3 mmHg, the estimated right ventricular systolic pressure is 66.2 mmHg. Left Atrium: Left atrial size was normal in size. Right Atrium: Right atrial size was normal in size. Pericardium: There is no evidence of pericardial effusion. Mitral Valve: The mitral valve is normal in structure. Trivial mitral valve regurgitation. No evidence of mitral valve stenosis. Tricuspid Valve: The tricuspid valve is normal in structure. Tricuspid valve regurgitation is mild . No evidence of tricuspid stenosis. Aortic Valve: The aortic valve is normal in structure. There is mild calcification of the aortic valve. There is mild thickening of the aortic valve. Aortic valve regurgitation is not visualized. No aortic stenosis is present. Pulmonic Valve: The pulmonic valve was normal in structure. Pulmonic valve regurgitation is not visualized. No evidence of pulmonic stenosis. Aorta: The aortic root is normal in size and structure. Venous: The inferior vena cava is normal in size with greater than 50% respiratory variability, suggesting right atrial pressure of 3 mmHg. IAS/Shunts: No atrial level shunt detected by color flow Doppler.  LEFT VENTRICLE PLAX 2D LVIDd:         3.60 cm  Diastology LVIDs:         1.90 cm  LV e' medial:    5.43 cm/s LV PW:         0.60 cm  LV E/e' medial:  13.2 LV IVS:        0.99 cm  LV e' lateral:   4.38 cm/s LVOT diam:     2.40 cm   LV E/e' lateral: 16.4 LV SV:         88 LV SV Index:   49 LVOT Area:     4.52 cm  RIGHT VENTRICLE RV S prime:     10.10 cm/s TAPSE (M-mode): 1.4 cm LEFT ATRIUM             Index LA diam:        2.70 cm 1.49 cm/m LA Vol (A2C):   27.2 ml 15.05 ml/m LA Vol (A4C):   19.1 ml 10.57 ml/m LA Biplane Vol: 22.8 ml 12.61 ml/m  AORTIC VALVE LVOT Vmax:   101.00 cm/s LVOT Vmean:  66.300 cm/s LVOT VTI:    0.195 m  AORTA Ao Root diam: 3.10 cm MITRAL VALVE                TRICUSPID VALVE MV Area (PHT): 1.86 cm     TR Peak grad:   15.8 mmHg MV Decel Time: 408 msec     TR Vmax:        199.00 cm/s MV E velocity: 71.70 cm/s MV A velocity: 110.00 cm/s  SHUNTS MV E/A ratio:  0.65         Systemic VTI:  0.20 m                             Systemic Diam: 2.40 cm Skeet Latch MD Electronically signed by Skeet Latch MD Signature Date/Time: 07/26/2020/1:40:07 PM    Final    ECHOCARDIOGRAM 07/26/20 IMPRESSIONS    1. Midcavitary gradient. Peak velocity 2.12 m/s. Peak gradient 18 mmHg. Marland Kitchen  Left ventricular ejection fraction, by estimation, is 65 to 70%. The left  ventricle has normal function. The left ventricle has no regional wall  motion abnormalities. There is  mild left ventricular hypertrophy. Left ventricular diastolic parameters  are consistent with Grade I diastolic dysfunction (impaired relaxation).  2. Right ventricular systolic function is normal. The right ventricular  size is normal. There is normal pulmonary artery systolic pressure.  3. The mitral valve is normal in structure. Trivial mitral valve  regurgitation. No evidence of mitral stenosis.  4. The aortic valve is normal in structure. There is mild calcification  of the aortic valve. There is mild thickening of the aortic valve. Aortic  valve regurgitation is not visualized. No aortic stenosis is present.  5. The inferior vena cava is normal in size with greater than 50%  respiratory variability, suggesting right atrial pressure of 3 mmHg.    FINDINGS  Left Ventricle: Midcavitary gradient. Peak velocity 2.12 m/s. Peak  gradient 18 mmHg. Left ventricular ejection fraction, by estimation, is 65  to 70%. The left ventricle has normal function. The left ventricle has no  regional wall motion abnormalities.  Definity contrast agent was given IV to delineate the left ventricular  endocardial borders. The left ventricular internal cavity size was normal  in size. There is mild left ventricular hypertrophy. Left ventricular  diastolic parameters are consistent  with Grade I diastolic dysfunction (impaired relaxation). Indeterminate  filling pressures.   Right Ventricle: The right ventricular size is normal. No increase in  right ventricular wall thickness. Right ventricular systolic function is  normal. There is normal pulmonary artery systolic pressure. The tricuspid  regurgitant velocity is 1.99 m/s, and  with an assumed right atrial pressure of 3 mmHg, the estimated right  ventricular systolic pressure is 79.8 mmHg.   Left Atrium: Left atrial size was normal in size.   Right Atrium: Right atrial size was normal in size.   Pericardium: There is no evidence of pericardial effusion.   Mitral Valve: The mitral valve is normal in structure. Trivial mitral  valve regurgitation. No evidence of mitral valve stenosis.   Tricuspid Valve: The tricuspid valve is normal in structure. Tricuspid  valve regurgitation is mild . No evidence of tricuspid stenosis.   Aortic Valve: The aortic valve is normal in structure. There is mild  calcification of the aortic valve. There is mild thickening of the aortic  valve. Aortic valve regurgitation is not visualized. No aortic stenosis is  present.   Pulmonic Valve: The pulmonic valve was normal in structure. Pulmonic valve  regurgitation is not visualized. No evidence of pulmonic stenosis.   Aorta: The aortic root is normal in size and structure.   Venous: The inferior vena cava is  normal in size with greater than 50%  respiratory variability, suggesting right atrial pressure of 3 mmHg.   IAS/Shunts: No atrial level shunt detected by color flow Doppler.     LEFT VENTRICLE  PLAX 2D  LVIDd:     3.60 cm Diastology  LVIDs:     1.90 cm LV e' medial:  5.43 cm/s  LV PW:     0.60 cm LV E/e' medial: 13.2  LV IVS:    0.99 cm LV e' lateral:  4.38 cm/s  LVOT diam:   2.40 cm LV E/e' lateral: 16.4  LV SV:     88  LV SV Index:  49  LVOT Area:   4.52 cm     RIGHT VENTRICLE  RV S prime:   10.10 cm/s  TAPSE (M-mode): 1.4 cm   LEFT ATRIUM       Index  LA diam:    2.70 cm 1.49 cm/m  LA Vol (A2C):  27.2 ml 15.05 ml/m  LA Vol (A4C):  19.1 ml 10.57 ml/m  LA Biplane Vol: 22.8 ml 12.61 ml/m  AORTIC VALVE  LVOT Vmax:  101.00 cm/s  LVOT Vmean: 66.300 cm/s  LVOT VTI:  0.195 m    AORTA  Ao Root diam: 3.10 cm   MITRAL VALVE        TRICUSPID VALVE  MV Area (PHT): 1.86 cm   TR Peak grad:  15.8 mmHg  MV Decel Time: 408 msec   TR Vmax:    199.00 cm/s  MV E velocity: 71.70 cm/s  MV A velocity: 110.00 cm/s SHUNTS  MV E/A ratio: 0.65     Systemic VTI: 0.20 m               Systemic Diam: 2.40 cm   Subjective:   Discharge Exam: Vitals:   07/26/20 1629 07/26/20 2146  BP: (!) 118/93 135/81  Pulse: 84 87  Resp:  16  Temp: 98.4 F (36.9 C) 98.4 F (36.9 C)  SpO2: 97% 100%   Vitals:   07/26/20 0542 07/26/20 0733 07/26/20 1629 07/26/20 2146  BP: (!) 153/72 (!) 150/77 (!) 118/93 135/81  Pulse: 80 71 84 87  Resp: (!) 23   16  Temp: 99.6 F (37.6 C) 99.6 F (37.6 C) 98.4 F (36.9 C) 98.4 F (36.9 C)  TempSrc: Oral   Oral  SpO2: 100%  97% 100%  Weight:      Height:       General: Pt is alert, awake, not in acute distress Cardiovascular: RRR, S1/S2 +, no rubs, no gallops Respiratory: CTA bilaterally, no wheezing, no rhonchi Abdominal: Soft, NT, ND, bowel sounds  + Extremities: no edema, no cyanosis  The results of significant diagnostics from this hospitalization (including imaging, microbiology, ancillary and laboratory) are listed below for reference.    Microbiology: Recent Results (from the past 240 hour(s))  Urine culture     Status: Abnormal   Collection Time: 07/25/20  8:00 PM   Specimen: Urine, Clean Catch  Result Value Ref Range Status   Specimen Description URINE, CLEAN CATCH  Final   Special Requests   Final    NONE Performed at [facility], [address] [place], [place] [address]    Culture MULTIPLE SPECIES PRESENT, SUGGEST RECOLLECTION (A)  Final   Report Status 07/27/2020 FINAL  Final  Respiratory Panel by RT PCR (Flu A&B, Covid) - Nasopharyngeal Swab     Status: None   Collection Time: 07/25/20 10:50 PM   Specimen: Nasopharyngeal Swab  Result Value Ref Range Status   SARS Coronavirus 2 by RT PCR NEGATIVE NEGATIVE Final    Comment: (NOTE) SARS-CoV-2 target nucleic acids are NOT DETECTED.  The SARS-CoV-2 RNA is generally detectable in upper respiratoy specimens during the acute phase of infection. The lowest concentration of SARS-CoV-2 viral copies this assay can detect is 131 copies/mL. A negative result does not preclude SARS-Cov-2 infection and should not be used as the sole basis for treatment or other patient management decisions. A negative result may occur with  improper specimen collection/handling, submission of specimen other than nasopharyngeal swab, presence of viral mutation(s) within the areas targeted by this assay, and inadequate number of viral copies (<131 copies/mL). A negative result must be combined with clinical observations, patient history, and epidemiological information. The expected result is Negative.  Fact Sheet for Patients:  PinkCheek.be  Fact Sheet for Healthcare Providers:  GravelBags.it  This test is no t yet approved  or cleared by the [place] FDA and  has been authorized for detection and/or diagnosis of SARS-CoV-2 by FDA under an Emergency Use Authorization (  EUA). This EUA will remain  in effect (meaning this test can be used) for the duration of the COVID-19 declaration under Section 564(b)(1) of the Act, 21 U.S.C. section 360bbb-3(b)(1), unless the authorization is terminated or revoked sooner.     Influenza A by PCR NEGATIVE NEGATIVE Final   Influenza B by PCR NEGATIVE NEGATIVE Final    Comment: (NOTE) The Xpert Xpress SARS-CoV-2/FLU/RSV assay is intended as an aid in  the diagnosis of influenza from Nasopharyngeal swab specimens and  should not be used as a sole basis for treatment. Nasal washings and  aspirates are unacceptable for Xpert Xpress SARS-CoV-2/FLU/RSV  testing.  Fact Sheet for Patients: PinkCheek.be  Fact Sheet for Healthcare Providers: GravelBags.it  This test is not yet approved or cleared by the [place] FDA and  has been authorized for detection and/or diagnosis of SARS-CoV-2 by  FDA under an Emergency Use Authorization (EUA). This EUA will remain  in effect (meaning this test can be used) for the duration of the  Covid-19 declaration under Section 564(b)(1) of the Act, 21  U.S.C. section 360bbb-3(b)(1), unless the authorization is  terminated or revoked. Performed at [facility], [place] [address], [place], [place] [address]   MRSA PCR Screening     Status: None   Collection Time: 07/26/20  5:26 AM   Specimen: Nasal Mucosa; Nasopharyngeal  Result Value Ref Range Status   MRSA by PCR NEGATIVE NEGATIVE Final    Comment:        The GeneXpert MRSA Assay (FDA approved for NASAL specimens only), is one component of a comprehensive MRSA colonization surveillance program. It is not intended to diagnose MRSA infection nor to guide or monitor treatment for MRSA infections. Performed at [facility], [place] [address], [place], [place] [address]   Culture, blood (routine x 2)     Status: None (Preliminary result)   Collection Time: 07/26/20  8:40 AM   Specimen: BLOOD  Result Value Ref Range Status   Specimen Description BLOOD RIGHT ANTECUBITAL  Final   Special Requests   Final    BOTTLES DRAWN AEROBIC AND ANAEROBIC Blood Culture results may not be optimal due to an inadequate volume of blood received in culture bottles   Culture   Final    NO GROWTH 1 DAY Performed at [facility], [place] [address] [place], [place] [address]    Report Status PENDING  Incomplete  Culture, blood (routine x 2)     Status: None (Preliminary result)   Collection Time: 07/26/20  8:55 AM   Specimen: BLOOD  Result Value Ref Range Status   Specimen Description BLOOD RIGHT ANTECUBITAL  Final   Special Requests   Final    BOTTLES DRAWN AEROBIC ONLY Blood Culture results may not be optimal due to an inadequate volume of blood received in culture bottles   Culture   Final    NO GROWTH 1 DAY Performed at [facility], [place] [address], [place], [place] [address]    Report Status PENDING  Incomplete    Labs: BNP (last 3 results) No results for input(s): BNP in the last 8760 hours. Basic Metabolic Panel: Recent Labs  Lab 07/25/20 1610 07/25/20 1621 07/26/20 0852  NA 141 141 141  K 3.9 3.9 4.2  CL 107 107 109  CO2 22  --  21*  GLUCOSE 119* 116* 85  BUN 23 25* 22  CREATININE 1.17* 1.10* 0.86  CALCIUM 9.5  --  9.6  MG  --   --  1.9  PHOS  --   --  3.2   Liver Function Tests: Recent Labs  Lab 07/25/20 1610 07/26/20 0852  AST 22 23  ALT 23 23  ALKPHOS 67 68  BILITOT 0.3 0.5  PROT 6.3* 6.3*  ALBUMIN 3.5 3.4*   No results for input(s): LIPASE, AMYLASE in the last 168 hours. No results for input(s): AMMONIA in the last 168 hours. CBC: Recent Labs  Lab 07/25/20 1610 07/25/20 1621 07/26/20 0852  WBC 10.9*  --  7.6  NEUTROABS 8.1*  --  4.4  HGB 14.3 15.0 13.8  HCT 45.9  44.0 42.7  MCV 103.8*  --  99.8  PLT 203  --  203   Cardiac Enzymes: No results for input(s): CKTOTAL, CKMB, CKMBINDEX, TROPONINI in the last 168 hours. BNP: Invalid input(s): POCBNP CBG: Recent Labs  Lab 07/25/20 1548  GLUCAP 107*   D-Dimer No results for input(s): DDIMER in the last 72 hours. Hgb A1c Recent Labs    07/26/20 0504  HGBA1C 5.1   Lipid Profile Recent Labs    07/26/20 0504  CHOL 152  HDL 32*  LDLCALC 87  TRIG 167*  CHOLHDL 4.8   Thyroid function studies Recent Labs    07/26/20 0852  TSH 0.759   Anemia work up No results for input(s): VITAMINB12, FOLATE, FERRITIN, TIBC, IRON, RETICCTPCT in the last 72 hours. Urinalysis    Component Value Date/Time   COLORURINE YELLOW 07/25/2020 2000   APPEARANCEUR CLOUDY (A) 07/25/2020 2000   LABSPEC >1.046 (H) 07/25/2020 2000   PHURINE 5.0 07/25/2020 2000   GLUCOSEU NEGATIVE 07/25/2020 2000   HGBUR LARGE (A) 07/25/2020 2000   BILIRUBINUR NEGATIVE 07/25/2020 2000   KETONESUR NEGATIVE 07/25/2020 2000   PROTEINUR 30 (A) 07/25/2020 2000   NITRITE NEGATIVE 07/25/2020 2000   LEUKOCYTESUR MODERATE (A) 07/25/2020 2000   Sepsis Labs Invalid input(s): PROCALCITONIN,  WBC,  LACTICIDVEN Microbiology Recent Results (from the past 240 hour(s))  Urine culture     Status: Abnormal   Collection Time: 07/25/20  8:00 PM   Specimen: Urine, Clean Catch  Result Value Ref Range Status   Specimen Description URINE, CLEAN CATCH  Final   Special Requests   Final    NONE Performed at [facility], [place] [address], [place], [place] [address]    Culture MULTIPLE SPECIES PRESENT, SUGGEST RECOLLECTION (A)  Final   Report Status 07/27/2020 FINAL  Final  Respiratory Panel by RT PCR (Flu A&B, Covid) - Nasopharyngeal Swab     Status: None   Collection Time: 07/25/20 10:50 PM   Specimen: Nasopharyngeal Swab  Result Value Ref Range Status   SARS Coronavirus 2 by RT PCR NEGATIVE NEGATIVE Final    Comment: (NOTE) SARS-CoV-2  target nucleic acids are NOT DETECTED.  The SARS-CoV-2 RNA is generally detectable in upper respiratoy specimens during the acute phase of infection. The lowest concentration of SARS-CoV-2 viral copies this assay can detect is 131 copies/mL. A negative result does not preclude SARS-Cov-2 infection and should not be used as the sole basis for treatment or other patient management decisions. A negative result may occur with  improper specimen collection/handling, submission of specimen other than nasopharyngeal swab, presence of viral mutation(s) within the areas targeted by this assay, and inadequate number of viral copies (<131 copies/mL). A negative result must be combined with clinical observations, patient history, and epidemiological information. The expected result is Negative.  Fact Sheet for Patients:  PinkCheek.be  Fact Sheet for Healthcare Providers:  GravelBags.it  This test is no t yet approved or cleared by the [place] FDA and  has been authorized for detection and/or diagnosis of SARS-CoV-2 by FDA under an Emergency Use Authorization (EUA). This EUA will remain  in effect (meaning this test can be used) for the duration of the COVID-19 declaration under Section 564(b)(1) of the Act, 21 U.S.C. section 360bbb-3(b)(1), unless the authorization is terminated or revoked sooner.     Influenza A by PCR NEGATIVE NEGATIVE Final   Influenza B by PCR NEGATIVE NEGATIVE Final    Comment: (NOTE) The Xpert Xpress SARS-CoV-2/FLU/RSV assay is intended as an aid in  the diagnosis of influenza from Nasopharyngeal swab specimens and  should not be used as a sole basis for treatment. Nasal washings and  aspirates are unacceptable for Xpert Xpress SARS-CoV-2/FLU/RSV  testing.  Fact Sheet for Patients: PinkCheek.be  Fact Sheet for Healthcare  Providers: GravelBags.it  This test is not yet approved or cleared by the [place] FDA and  has been authorized for detection and/or diagnosis of SARS-CoV-2 by  FDA under an Emergency Use Authorization (EUA). This EUA will remain  in effect (meaning this test can be used) for the duration of the  Covid-19 declaration under Section 564(b)(1) of the Act, 21  U.S.C. section 360bbb-3(b)(1), unless the authorization is  terminated or revoked. Performed at [facility], [place] [address], [place], [place] [address]   MRSA PCR Screening     Status: None   Collection Time: 07/26/20  5:26 AM   Specimen: Nasal Mucosa; Nasopharyngeal  Result Value Ref Range Status   MRSA by PCR NEGATIVE NEGATIVE Final    Comment:        The GeneXpert MRSA Assay (FDA approved for NASAL specimens only), is one component of a comprehensive MRSA colonization surveillance program. It is not intended to diagnose MRSA infection nor to guide or monitor treatment for MRSA infections. Performed at [facility], [place] [address], [place], [place] [address]   Culture, blood (routine x 2)     Status: None (Preliminary result)   Collection Time: 07/26/20  8:40 AM   Specimen: BLOOD  Result Value Ref Range Status   Specimen Description BLOOD RIGHT ANTECUBITAL  Final   Special Requests   Final    BOTTLES DRAWN AEROBIC AND ANAEROBIC Blood Culture results may not be optimal due to an inadequate volume of blood received in culture bottles   Culture   Final    NO GROWTH 1 DAY Performed at [facility], [place] [address], [place], [place] [address]    Report Status PENDING  Incomplete  Culture, blood (routine x 2)     Status: None (Preliminary result)   Collection Time: 07/26/20  8:55 AM   Specimen: BLOOD  Result Value Ref Range Status   Specimen Description BLOOD RIGHT ANTECUBITAL  Final   Special Requests   Final    BOTTLES DRAWN AEROBIC ONLY Blood Culture results may not  be optimal due to an inadequate volume of blood received in culture bottles   Culture   Final    NO GROWTH 1 DAY Performed at [facility], [place] [address] [place],  [address]    Report Status PENDING  Incomplete   Time coordinating discharge: 35 minutes  SIGNED:  Kerney Elbe, DO [facility] 07/27/2020, 5:43 PM Pager is on [place]  If 7PM-7AM, please contact night-coverage [email or web address]

## 2020-07-28 DIAGNOSIS — R41841 Cognitive communication deficit: Secondary | ICD-10-CM | POA: Diagnosis not present

## 2020-07-28 DIAGNOSIS — R2681 Unsteadiness on feet: Secondary | ICD-10-CM | POA: Diagnosis not present

## 2020-07-28 DIAGNOSIS — R278 Other lack of coordination: Secondary | ICD-10-CM | POA: Diagnosis not present

## 2020-07-28 DIAGNOSIS — R1311 Dysphagia, oral phase: Secondary | ICD-10-CM | POA: Diagnosis not present

## 2020-07-28 DIAGNOSIS — Z9181 History of falling: Secondary | ICD-10-CM | POA: Diagnosis not present

## 2020-07-28 DIAGNOSIS — G20 Parkinson's disease: Secondary | ICD-10-CM | POA: Diagnosis not present

## 2020-07-28 DIAGNOSIS — R2689 Other abnormalities of gait and mobility: Secondary | ICD-10-CM | POA: Diagnosis not present

## 2020-07-28 DIAGNOSIS — R1312 Dysphagia, oropharyngeal phase: Secondary | ICD-10-CM | POA: Diagnosis not present

## 2020-07-28 DIAGNOSIS — M6281 Muscle weakness (generalized): Secondary | ICD-10-CM | POA: Diagnosis not present

## 2020-07-28 LAB — URINE CULTURE

## 2020-07-29 DIAGNOSIS — F329 Major depressive disorder, single episode, unspecified: Secondary | ICD-10-CM | POA: Diagnosis not present

## 2020-07-29 DIAGNOSIS — M5033 Other cervical disc degeneration, cervicothoracic region: Secondary | ICD-10-CM | POA: Diagnosis not present

## 2020-07-29 DIAGNOSIS — I10 Essential (primary) hypertension: Secondary | ICD-10-CM | POA: Diagnosis not present

## 2020-07-29 DIAGNOSIS — M4855XA Collapsed vertebra, not elsewhere classified, thoracolumbar region, initial encounter for fracture: Secondary | ICD-10-CM | POA: Diagnosis not present

## 2020-07-29 DIAGNOSIS — G20 Parkinson's disease: Secondary | ICD-10-CM | POA: Diagnosis not present

## 2020-07-29 DIAGNOSIS — M810 Age-related osteoporosis without current pathological fracture: Secondary | ICD-10-CM | POA: Diagnosis not present

## 2020-07-29 DIAGNOSIS — F0150 Vascular dementia without behavioral disturbance: Secondary | ICD-10-CM | POA: Diagnosis not present

## 2020-07-31 LAB — CULTURE, BLOOD (ROUTINE X 2)
Culture: NO GROWTH
Culture: NO GROWTH

## 2020-08-01 DIAGNOSIS — H0589 Other disorders of orbit: Secondary | ICD-10-CM | POA: Diagnosis not present

## 2020-08-01 DIAGNOSIS — M5489 Other dorsalgia: Secondary | ICD-10-CM | POA: Diagnosis not present

## 2020-08-01 DIAGNOSIS — R4189 Other symptoms and signs involving cognitive functions and awareness: Secondary | ICD-10-CM | POA: Diagnosis not present

## 2020-08-01 DIAGNOSIS — I10 Essential (primary) hypertension: Secondary | ICD-10-CM | POA: Diagnosis not present

## 2020-08-01 DIAGNOSIS — G458 Other transient cerebral ischemic attacks and related syndromes: Secondary | ICD-10-CM | POA: Diagnosis not present

## 2020-08-04 DIAGNOSIS — R7981 Abnormal blood-gas level: Secondary | ICD-10-CM | POA: Diagnosis not present

## 2020-08-05 DIAGNOSIS — M4855XA Collapsed vertebra, not elsewhere classified, thoracolumbar region, initial encounter for fracture: Secondary | ICD-10-CM | POA: Diagnosis not present

## 2020-08-05 DIAGNOSIS — M810 Age-related osteoporosis without current pathological fracture: Secondary | ICD-10-CM | POA: Diagnosis not present

## 2020-08-05 DIAGNOSIS — M5033 Other cervical disc degeneration, cervicothoracic region: Secondary | ICD-10-CM | POA: Diagnosis not present

## 2020-08-05 DIAGNOSIS — G20 Parkinson's disease: Secondary | ICD-10-CM | POA: Diagnosis not present

## 2020-08-05 DIAGNOSIS — F0150 Vascular dementia without behavioral disturbance: Secondary | ICD-10-CM | POA: Diagnosis not present

## 2020-08-05 DIAGNOSIS — F329 Major depressive disorder, single episode, unspecified: Secondary | ICD-10-CM | POA: Diagnosis not present

## 2020-08-05 DIAGNOSIS — I10 Essential (primary) hypertension: Secondary | ICD-10-CM | POA: Diagnosis not present

## 2020-08-12 DIAGNOSIS — F329 Major depressive disorder, single episode, unspecified: Secondary | ICD-10-CM | POA: Diagnosis not present

## 2020-08-12 DIAGNOSIS — F0150 Vascular dementia without behavioral disturbance: Secondary | ICD-10-CM | POA: Diagnosis not present

## 2020-08-12 DIAGNOSIS — G20 Parkinson's disease: Secondary | ICD-10-CM | POA: Diagnosis not present

## 2020-08-12 DIAGNOSIS — M810 Age-related osteoporosis without current pathological fracture: Secondary | ICD-10-CM | POA: Diagnosis not present

## 2020-08-12 DIAGNOSIS — M5033 Other cervical disc degeneration, cervicothoracic region: Secondary | ICD-10-CM | POA: Diagnosis not present

## 2020-08-12 DIAGNOSIS — M4855XA Collapsed vertebra, not elsewhere classified, thoracolumbar region, initial encounter for fracture: Secondary | ICD-10-CM | POA: Diagnosis not present

## 2020-08-12 DIAGNOSIS — I10 Essential (primary) hypertension: Secondary | ICD-10-CM | POA: Diagnosis not present

## 2020-08-18 DIAGNOSIS — M79675 Pain in left toe(s): Secondary | ICD-10-CM | POA: Diagnosis not present

## 2020-08-18 DIAGNOSIS — L603 Nail dystrophy: Secondary | ICD-10-CM | POA: Diagnosis not present

## 2020-08-18 DIAGNOSIS — R262 Difficulty in walking, not elsewhere classified: Secondary | ICD-10-CM | POA: Diagnosis not present

## 2020-08-18 DIAGNOSIS — M79674 Pain in right toe(s): Secondary | ICD-10-CM | POA: Diagnosis not present

## 2020-08-18 DIAGNOSIS — I739 Peripheral vascular disease, unspecified: Secondary | ICD-10-CM | POA: Diagnosis not present

## 2020-08-18 DIAGNOSIS — B351 Tinea unguium: Secondary | ICD-10-CM | POA: Diagnosis not present

## 2020-08-19 DIAGNOSIS — R2689 Other abnormalities of gait and mobility: Secondary | ICD-10-CM | POA: Diagnosis not present

## 2020-08-19 DIAGNOSIS — R278 Other lack of coordination: Secondary | ICD-10-CM | POA: Diagnosis not present

## 2020-08-19 DIAGNOSIS — G20 Parkinson's disease: Secondary | ICD-10-CM | POA: Diagnosis not present

## 2020-08-19 DIAGNOSIS — M6281 Muscle weakness (generalized): Secondary | ICD-10-CM | POA: Diagnosis not present

## 2020-08-19 DIAGNOSIS — R1312 Dysphagia, oropharyngeal phase: Secondary | ICD-10-CM | POA: Diagnosis not present

## 2020-08-19 DIAGNOSIS — R41841 Cognitive communication deficit: Secondary | ICD-10-CM | POA: Diagnosis not present

## 2020-08-19 DIAGNOSIS — R1311 Dysphagia, oral phase: Secondary | ICD-10-CM | POA: Diagnosis not present

## 2020-08-19 DIAGNOSIS — Z9181 History of falling: Secondary | ICD-10-CM | POA: Diagnosis not present

## 2020-08-19 DIAGNOSIS — R2681 Unsteadiness on feet: Secondary | ICD-10-CM | POA: Diagnosis not present

## 2020-08-20 DIAGNOSIS — G20 Parkinson's disease: Secondary | ICD-10-CM | POA: Diagnosis not present

## 2020-08-20 DIAGNOSIS — M4855XA Collapsed vertebra, not elsewhere classified, thoracolumbar region, initial encounter for fracture: Secondary | ICD-10-CM | POA: Diagnosis not present

## 2020-08-20 DIAGNOSIS — F329 Major depressive disorder, single episode, unspecified: Secondary | ICD-10-CM | POA: Diagnosis not present

## 2020-08-20 DIAGNOSIS — I10 Essential (primary) hypertension: Secondary | ICD-10-CM | POA: Diagnosis not present

## 2020-08-20 DIAGNOSIS — M810 Age-related osteoporosis without current pathological fracture: Secondary | ICD-10-CM | POA: Diagnosis not present

## 2020-08-20 DIAGNOSIS — M5033 Other cervical disc degeneration, cervicothoracic region: Secondary | ICD-10-CM | POA: Diagnosis not present

## 2020-08-20 DIAGNOSIS — F0150 Vascular dementia without behavioral disturbance: Secondary | ICD-10-CM | POA: Diagnosis not present

## 2020-08-22 DIAGNOSIS — H05121 Orbital myositis, right orbit: Secondary | ICD-10-CM | POA: Diagnosis not present

## 2020-08-22 DIAGNOSIS — H43813 Vitreous degeneration, bilateral: Secondary | ICD-10-CM | POA: Diagnosis not present

## 2020-08-22 DIAGNOSIS — H2513 Age-related nuclear cataract, bilateral: Secondary | ICD-10-CM | POA: Diagnosis not present

## 2020-08-27 DIAGNOSIS — M4855XA Collapsed vertebra, not elsewhere classified, thoracolumbar region, initial encounter for fracture: Secondary | ICD-10-CM | POA: Diagnosis not present

## 2020-08-27 DIAGNOSIS — M810 Age-related osteoporosis without current pathological fracture: Secondary | ICD-10-CM | POA: Diagnosis not present

## 2020-08-27 DIAGNOSIS — F329 Major depressive disorder, single episode, unspecified: Secondary | ICD-10-CM | POA: Diagnosis not present

## 2020-08-27 DIAGNOSIS — I10 Essential (primary) hypertension: Secondary | ICD-10-CM | POA: Diagnosis not present

## 2020-08-27 DIAGNOSIS — F0150 Vascular dementia without behavioral disturbance: Secondary | ICD-10-CM | POA: Diagnosis not present

## 2020-08-27 DIAGNOSIS — M5033 Other cervical disc degeneration, cervicothoracic region: Secondary | ICD-10-CM | POA: Diagnosis not present

## 2020-08-27 DIAGNOSIS — G20 Parkinson's disease: Secondary | ICD-10-CM | POA: Diagnosis not present

## 2020-08-28 DIAGNOSIS — F0390 Unspecified dementia without behavioral disturbance: Secondary | ICD-10-CM | POA: Diagnosis not present

## 2020-08-28 DIAGNOSIS — F39 Unspecified mood [affective] disorder: Secondary | ICD-10-CM | POA: Diagnosis not present

## 2020-08-28 DIAGNOSIS — R262 Difficulty in walking, not elsewhere classified: Secondary | ICD-10-CM | POA: Diagnosis not present

## 2020-08-28 DIAGNOSIS — M810 Age-related osteoporosis without current pathological fracture: Secondary | ICD-10-CM | POA: Diagnosis not present

## 2020-09-02 DIAGNOSIS — M5033 Other cervical disc degeneration, cervicothoracic region: Secondary | ICD-10-CM | POA: Diagnosis not present

## 2020-09-02 DIAGNOSIS — I10 Essential (primary) hypertension: Secondary | ICD-10-CM | POA: Diagnosis not present

## 2020-09-02 DIAGNOSIS — F0150 Vascular dementia without behavioral disturbance: Secondary | ICD-10-CM | POA: Diagnosis not present

## 2020-09-02 DIAGNOSIS — G20 Parkinson's disease: Secondary | ICD-10-CM | POA: Diagnosis not present

## 2020-09-02 DIAGNOSIS — M4855XA Collapsed vertebra, not elsewhere classified, thoracolumbar region, initial encounter for fracture: Secondary | ICD-10-CM | POA: Diagnosis not present

## 2020-09-02 DIAGNOSIS — M810 Age-related osteoporosis without current pathological fracture: Secondary | ICD-10-CM | POA: Diagnosis not present

## 2020-09-02 DIAGNOSIS — F329 Major depressive disorder, single episode, unspecified: Secondary | ICD-10-CM | POA: Diagnosis not present

## 2020-09-09 DIAGNOSIS — F329 Major depressive disorder, single episode, unspecified: Secondary | ICD-10-CM | POA: Diagnosis not present

## 2020-09-09 DIAGNOSIS — F0150 Vascular dementia without behavioral disturbance: Secondary | ICD-10-CM | POA: Diagnosis not present

## 2020-09-09 DIAGNOSIS — I10 Essential (primary) hypertension: Secondary | ICD-10-CM | POA: Diagnosis not present

## 2020-09-09 DIAGNOSIS — M810 Age-related osteoporosis without current pathological fracture: Secondary | ICD-10-CM | POA: Diagnosis not present

## 2020-09-09 DIAGNOSIS — M5033 Other cervical disc degeneration, cervicothoracic region: Secondary | ICD-10-CM | POA: Diagnosis not present

## 2020-09-09 DIAGNOSIS — M4855XA Collapsed vertebra, not elsewhere classified, thoracolumbar region, initial encounter for fracture: Secondary | ICD-10-CM | POA: Diagnosis not present

## 2020-09-09 DIAGNOSIS — G20 Parkinson's disease: Secondary | ICD-10-CM | POA: Diagnosis not present

## 2020-09-16 DIAGNOSIS — M4855XA Collapsed vertebra, not elsewhere classified, thoracolumbar region, initial encounter for fracture: Secondary | ICD-10-CM | POA: Diagnosis not present

## 2020-09-16 DIAGNOSIS — F0150 Vascular dementia without behavioral disturbance: Secondary | ICD-10-CM | POA: Diagnosis not present

## 2020-09-16 DIAGNOSIS — M545 Low back pain, unspecified: Secondary | ICD-10-CM | POA: Diagnosis not present

## 2020-09-16 DIAGNOSIS — M1388 Other specified arthritis, other site: Secondary | ICD-10-CM | POA: Diagnosis not present

## 2020-09-16 DIAGNOSIS — G20 Parkinson's disease: Secondary | ICD-10-CM | POA: Diagnosis not present

## 2020-09-16 DIAGNOSIS — F329 Major depressive disorder, single episode, unspecified: Secondary | ICD-10-CM | POA: Diagnosis not present

## 2020-09-16 DIAGNOSIS — M5033 Other cervical disc degeneration, cervicothoracic region: Secondary | ICD-10-CM | POA: Diagnosis not present

## 2020-09-16 DIAGNOSIS — I10 Essential (primary) hypertension: Secondary | ICD-10-CM | POA: Diagnosis not present

## 2020-09-16 DIAGNOSIS — M797 Fibromyalgia: Secondary | ICD-10-CM | POA: Diagnosis not present

## 2020-09-16 DIAGNOSIS — M810 Age-related osteoporosis without current pathological fracture: Secondary | ICD-10-CM | POA: Diagnosis not present

## 2020-09-17 DIAGNOSIS — M6281 Muscle weakness (generalized): Secondary | ICD-10-CM | POA: Diagnosis not present

## 2020-09-17 DIAGNOSIS — R2689 Other abnormalities of gait and mobility: Secondary | ICD-10-CM | POA: Diagnosis not present

## 2020-09-17 DIAGNOSIS — R278 Other lack of coordination: Secondary | ICD-10-CM | POA: Diagnosis not present

## 2020-09-17 DIAGNOSIS — R1312 Dysphagia, oropharyngeal phase: Secondary | ICD-10-CM | POA: Diagnosis not present

## 2020-09-17 DIAGNOSIS — R41841 Cognitive communication deficit: Secondary | ICD-10-CM | POA: Diagnosis not present

## 2020-09-17 DIAGNOSIS — R1311 Dysphagia, oral phase: Secondary | ICD-10-CM | POA: Diagnosis not present

## 2020-09-17 DIAGNOSIS — R2681 Unsteadiness on feet: Secondary | ICD-10-CM | POA: Diagnosis not present

## 2020-09-17 DIAGNOSIS — G20 Parkinson's disease: Secondary | ICD-10-CM | POA: Diagnosis not present

## 2020-09-17 DIAGNOSIS — Z9181 History of falling: Secondary | ICD-10-CM | POA: Diagnosis not present

## 2020-09-23 DIAGNOSIS — M4855XA Collapsed vertebra, not elsewhere classified, thoracolumbar region, initial encounter for fracture: Secondary | ICD-10-CM | POA: Diagnosis not present

## 2020-09-23 DIAGNOSIS — F329 Major depressive disorder, single episode, unspecified: Secondary | ICD-10-CM | POA: Diagnosis not present

## 2020-09-23 DIAGNOSIS — G20 Parkinson's disease: Secondary | ICD-10-CM | POA: Diagnosis not present

## 2020-09-23 DIAGNOSIS — M5033 Other cervical disc degeneration, cervicothoracic region: Secondary | ICD-10-CM | POA: Diagnosis not present

## 2020-09-23 DIAGNOSIS — I10 Essential (primary) hypertension: Secondary | ICD-10-CM | POA: Diagnosis not present

## 2020-09-23 DIAGNOSIS — M810 Age-related osteoporosis without current pathological fracture: Secondary | ICD-10-CM | POA: Diagnosis not present

## 2020-09-23 DIAGNOSIS — F0150 Vascular dementia without behavioral disturbance: Secondary | ICD-10-CM | POA: Diagnosis not present

## 2020-09-24 DIAGNOSIS — I10 Essential (primary) hypertension: Secondary | ICD-10-CM | POA: Diagnosis not present

## 2020-09-24 DIAGNOSIS — M5440 Lumbago with sciatica, unspecified side: Secondary | ICD-10-CM | POA: Diagnosis not present

## 2020-09-24 DIAGNOSIS — M62830 Muscle spasm of back: Secondary | ICD-10-CM | POA: Diagnosis not present

## 2020-09-30 DIAGNOSIS — M5033 Other cervical disc degeneration, cervicothoracic region: Secondary | ICD-10-CM | POA: Diagnosis not present

## 2020-09-30 DIAGNOSIS — I10 Essential (primary) hypertension: Secondary | ICD-10-CM | POA: Diagnosis not present

## 2020-09-30 DIAGNOSIS — M810 Age-related osteoporosis without current pathological fracture: Secondary | ICD-10-CM | POA: Diagnosis not present

## 2020-09-30 DIAGNOSIS — M4855XA Collapsed vertebra, not elsewhere classified, thoracolumbar region, initial encounter for fracture: Secondary | ICD-10-CM | POA: Diagnosis not present

## 2020-09-30 DIAGNOSIS — F329 Major depressive disorder, single episode, unspecified: Secondary | ICD-10-CM | POA: Diagnosis not present

## 2020-09-30 DIAGNOSIS — G20 Parkinson's disease: Secondary | ICD-10-CM | POA: Diagnosis not present

## 2020-09-30 DIAGNOSIS — F0150 Vascular dementia without behavioral disturbance: Secondary | ICD-10-CM | POA: Diagnosis not present

## 2020-10-07 DIAGNOSIS — G20 Parkinson's disease: Secondary | ICD-10-CM | POA: Diagnosis not present

## 2020-10-07 DIAGNOSIS — M810 Age-related osteoporosis without current pathological fracture: Secondary | ICD-10-CM | POA: Diagnosis not present

## 2020-10-07 DIAGNOSIS — I10 Essential (primary) hypertension: Secondary | ICD-10-CM | POA: Diagnosis not present

## 2020-10-07 DIAGNOSIS — M5033 Other cervical disc degeneration, cervicothoracic region: Secondary | ICD-10-CM | POA: Diagnosis not present

## 2020-10-07 DIAGNOSIS — M4855XA Collapsed vertebra, not elsewhere classified, thoracolumbar region, initial encounter for fracture: Secondary | ICD-10-CM | POA: Diagnosis not present

## 2020-10-07 DIAGNOSIS — F329 Major depressive disorder, single episode, unspecified: Secondary | ICD-10-CM | POA: Diagnosis not present

## 2020-10-07 DIAGNOSIS — F0150 Vascular dementia without behavioral disturbance: Secondary | ICD-10-CM | POA: Diagnosis not present

## 2020-10-14 DIAGNOSIS — M810 Age-related osteoporosis without current pathological fracture: Secondary | ICD-10-CM | POA: Diagnosis not present

## 2020-10-14 DIAGNOSIS — F329 Major depressive disorder, single episode, unspecified: Secondary | ICD-10-CM | POA: Diagnosis not present

## 2020-10-14 DIAGNOSIS — I10 Essential (primary) hypertension: Secondary | ICD-10-CM | POA: Diagnosis not present

## 2020-10-14 DIAGNOSIS — M5033 Other cervical disc degeneration, cervicothoracic region: Secondary | ICD-10-CM | POA: Diagnosis not present

## 2020-10-14 DIAGNOSIS — M4855XA Collapsed vertebra, not elsewhere classified, thoracolumbar region, initial encounter for fracture: Secondary | ICD-10-CM | POA: Diagnosis not present

## 2020-10-14 DIAGNOSIS — G20 Parkinson's disease: Secondary | ICD-10-CM | POA: Diagnosis not present

## 2020-10-14 DIAGNOSIS — F0150 Vascular dementia without behavioral disturbance: Secondary | ICD-10-CM | POA: Diagnosis not present

## 2020-10-15 ENCOUNTER — Ambulatory Visit: Payer: Commercial Managed Care - HMO | Admitting: Neurology

## 2020-10-20 DIAGNOSIS — M5033 Other cervical disc degeneration, cervicothoracic region: Secondary | ICD-10-CM | POA: Diagnosis not present

## 2020-10-20 DIAGNOSIS — Z9181 History of falling: Secondary | ICD-10-CM | POA: Diagnosis not present

## 2020-10-20 DIAGNOSIS — I10 Essential (primary) hypertension: Secondary | ICD-10-CM | POA: Diagnosis not present

## 2020-10-20 DIAGNOSIS — R1312 Dysphagia, oropharyngeal phase: Secondary | ICD-10-CM | POA: Diagnosis not present

## 2020-10-20 DIAGNOSIS — G20 Parkinson's disease: Secondary | ICD-10-CM | POA: Diagnosis not present

## 2020-10-20 DIAGNOSIS — M810 Age-related osteoporosis without current pathological fracture: Secondary | ICD-10-CM | POA: Diagnosis not present

## 2020-10-20 DIAGNOSIS — R278 Other lack of coordination: Secondary | ICD-10-CM | POA: Diagnosis not present

## 2020-10-20 DIAGNOSIS — F0150 Vascular dementia without behavioral disturbance: Secondary | ICD-10-CM | POA: Diagnosis not present

## 2020-10-20 DIAGNOSIS — M6281 Muscle weakness (generalized): Secondary | ICD-10-CM | POA: Diagnosis not present

## 2020-10-20 DIAGNOSIS — F329 Major depressive disorder, single episode, unspecified: Secondary | ICD-10-CM | POA: Diagnosis not present

## 2020-10-20 DIAGNOSIS — R2689 Other abnormalities of gait and mobility: Secondary | ICD-10-CM | POA: Diagnosis not present

## 2020-10-20 DIAGNOSIS — R1311 Dysphagia, oral phase: Secondary | ICD-10-CM | POA: Diagnosis not present

## 2020-10-20 DIAGNOSIS — R41841 Cognitive communication deficit: Secondary | ICD-10-CM | POA: Diagnosis not present

## 2020-10-20 DIAGNOSIS — M4855XA Collapsed vertebra, not elsewhere classified, thoracolumbar region, initial encounter for fracture: Secondary | ICD-10-CM | POA: Diagnosis not present

## 2020-10-20 DIAGNOSIS — R2681 Unsteadiness on feet: Secondary | ICD-10-CM | POA: Diagnosis not present

## 2020-10-27 DIAGNOSIS — F331 Major depressive disorder, recurrent, moderate: Secondary | ICD-10-CM | POA: Diagnosis not present

## 2020-10-27 DIAGNOSIS — G4700 Insomnia, unspecified: Secondary | ICD-10-CM | POA: Diagnosis not present

## 2020-10-28 DIAGNOSIS — M810 Age-related osteoporosis without current pathological fracture: Secondary | ICD-10-CM | POA: Diagnosis not present

## 2020-10-28 DIAGNOSIS — G20 Parkinson's disease: Secondary | ICD-10-CM | POA: Diagnosis not present

## 2020-10-28 DIAGNOSIS — E559 Vitamin D deficiency, unspecified: Secondary | ICD-10-CM | POA: Diagnosis not present

## 2020-10-28 DIAGNOSIS — E538 Deficiency of other specified B group vitamins: Secondary | ICD-10-CM | POA: Diagnosis not present

## 2020-10-28 DIAGNOSIS — F39 Unspecified mood [affective] disorder: Secondary | ICD-10-CM | POA: Diagnosis not present

## 2020-10-28 DIAGNOSIS — H1089 Other conjunctivitis: Secondary | ICD-10-CM | POA: Diagnosis not present

## 2020-10-28 DIAGNOSIS — I10 Essential (primary) hypertension: Secondary | ICD-10-CM | POA: Diagnosis not present

## 2020-10-28 DIAGNOSIS — K219 Gastro-esophageal reflux disease without esophagitis: Secondary | ICD-10-CM | POA: Diagnosis not present

## 2020-10-28 DIAGNOSIS — F0150 Vascular dementia without behavioral disturbance: Secondary | ICD-10-CM | POA: Diagnosis not present

## 2020-10-28 DIAGNOSIS — I679 Cerebrovascular disease, unspecified: Secondary | ICD-10-CM | POA: Diagnosis not present

## 2020-10-29 DIAGNOSIS — I10 Essential (primary) hypertension: Secondary | ICD-10-CM | POA: Diagnosis not present

## 2020-10-29 DIAGNOSIS — M6281 Muscle weakness (generalized): Secondary | ICD-10-CM | POA: Diagnosis not present

## 2020-10-29 DIAGNOSIS — R278 Other lack of coordination: Secondary | ICD-10-CM | POA: Diagnosis not present

## 2020-10-29 DIAGNOSIS — G20 Parkinson's disease: Secondary | ICD-10-CM | POA: Diagnosis not present

## 2020-10-29 DIAGNOSIS — E559 Vitamin D deficiency, unspecified: Secondary | ICD-10-CM | POA: Diagnosis not present

## 2020-10-30 DIAGNOSIS — I10 Essential (primary) hypertension: Secondary | ICD-10-CM | POA: Diagnosis not present

## 2020-10-30 DIAGNOSIS — M5033 Other cervical disc degeneration, cervicothoracic region: Secondary | ICD-10-CM | POA: Diagnosis not present

## 2020-10-30 DIAGNOSIS — G20 Parkinson's disease: Secondary | ICD-10-CM | POA: Diagnosis not present

## 2020-10-30 DIAGNOSIS — F329 Major depressive disorder, single episode, unspecified: Secondary | ICD-10-CM | POA: Diagnosis not present

## 2020-10-30 DIAGNOSIS — F0150 Vascular dementia without behavioral disturbance: Secondary | ICD-10-CM | POA: Diagnosis not present

## 2020-10-30 DIAGNOSIS — M4855XA Collapsed vertebra, not elsewhere classified, thoracolumbar region, initial encounter for fracture: Secondary | ICD-10-CM | POA: Diagnosis not present

## 2020-10-30 DIAGNOSIS — M810 Age-related osteoporosis without current pathological fracture: Secondary | ICD-10-CM | POA: Diagnosis not present

## 2020-10-31 DIAGNOSIS — R5381 Other malaise: Secondary | ICD-10-CM | POA: Diagnosis not present

## 2020-10-31 DIAGNOSIS — F0390 Unspecified dementia without behavioral disturbance: Secondary | ICD-10-CM | POA: Diagnosis not present

## 2020-10-31 DIAGNOSIS — M549 Dorsalgia, unspecified: Secondary | ICD-10-CM | POA: Diagnosis not present

## 2020-10-31 DIAGNOSIS — M6259 Muscle wasting and atrophy, not elsewhere classified, multiple sites: Secondary | ICD-10-CM | POA: Diagnosis not present

## 2020-11-12 DIAGNOSIS — G20 Parkinson's disease: Secondary | ICD-10-CM | POA: Diagnosis not present

## 2020-11-12 DIAGNOSIS — R2681 Unsteadiness on feet: Secondary | ICD-10-CM | POA: Diagnosis not present

## 2020-11-12 DIAGNOSIS — J309 Allergic rhinitis, unspecified: Secondary | ICD-10-CM | POA: Diagnosis not present

## 2020-11-12 DIAGNOSIS — U071 COVID-19: Secondary | ICD-10-CM | POA: Diagnosis not present

## 2020-11-12 DIAGNOSIS — R0989 Other specified symptoms and signs involving the circulatory and respiratory systems: Secondary | ICD-10-CM | POA: Diagnosis not present

## 2020-11-12 DIAGNOSIS — R059 Cough, unspecified: Secondary | ICD-10-CM | POA: Diagnosis not present

## 2020-11-25 DIAGNOSIS — R1312 Dysphagia, oropharyngeal phase: Secondary | ICD-10-CM | POA: Diagnosis not present

## 2020-11-25 DIAGNOSIS — R41841 Cognitive communication deficit: Secondary | ICD-10-CM | POA: Diagnosis not present

## 2020-11-25 DIAGNOSIS — R2681 Unsteadiness on feet: Secondary | ICD-10-CM | POA: Diagnosis not present

## 2020-11-25 DIAGNOSIS — M6281 Muscle weakness (generalized): Secondary | ICD-10-CM | POA: Diagnosis not present

## 2020-11-25 DIAGNOSIS — R278 Other lack of coordination: Secondary | ICD-10-CM | POA: Diagnosis not present

## 2020-11-25 DIAGNOSIS — G20 Parkinson's disease: Secondary | ICD-10-CM | POA: Diagnosis not present

## 2020-11-25 DIAGNOSIS — R1319 Other dysphagia: Secondary | ICD-10-CM | POA: Diagnosis not present

## 2020-11-25 DIAGNOSIS — R058 Other specified cough: Secondary | ICD-10-CM | POA: Diagnosis not present

## 2020-11-25 DIAGNOSIS — R1311 Dysphagia, oral phase: Secondary | ICD-10-CM | POA: Diagnosis not present

## 2020-11-25 DIAGNOSIS — U071 COVID-19: Secondary | ICD-10-CM | POA: Diagnosis not present

## 2020-11-25 DIAGNOSIS — R2689 Other abnormalities of gait and mobility: Secondary | ICD-10-CM | POA: Diagnosis not present

## 2020-11-25 DIAGNOSIS — Z9181 History of falling: Secondary | ICD-10-CM | POA: Diagnosis not present

## 2020-11-27 DIAGNOSIS — B351 Tinea unguium: Secondary | ICD-10-CM | POA: Diagnosis not present

## 2020-11-27 DIAGNOSIS — L603 Nail dystrophy: Secondary | ICD-10-CM | POA: Diagnosis not present

## 2020-11-27 DIAGNOSIS — M2042 Other hammer toe(s) (acquired), left foot: Secondary | ICD-10-CM | POA: Diagnosis not present

## 2020-11-27 DIAGNOSIS — I739 Peripheral vascular disease, unspecified: Secondary | ICD-10-CM | POA: Diagnosis not present

## 2020-11-27 DIAGNOSIS — M2041 Other hammer toe(s) (acquired), right foot: Secondary | ICD-10-CM | POA: Diagnosis not present

## 2020-12-01 DIAGNOSIS — Z7189 Other specified counseling: Secondary | ICD-10-CM | POA: Diagnosis not present

## 2020-12-01 DIAGNOSIS — R0989 Other specified symptoms and signs involving the circulatory and respiratory systems: Secondary | ICD-10-CM | POA: Diagnosis not present

## 2020-12-01 DIAGNOSIS — R627 Adult failure to thrive: Secondary | ICD-10-CM | POA: Diagnosis not present

## 2020-12-01 DIAGNOSIS — U071 COVID-19: Secondary | ICD-10-CM | POA: Diagnosis not present

## 2020-12-02 DIAGNOSIS — I10 Essential (primary) hypertension: Secondary | ICD-10-CM | POA: Diagnosis not present

## 2020-12-02 DIAGNOSIS — R8290 Unspecified abnormal findings in urine: Secondary | ICD-10-CM | POA: Diagnosis not present

## 2020-12-02 DIAGNOSIS — N390 Urinary tract infection, site not specified: Secondary | ICD-10-CM | POA: Diagnosis not present

## 2020-12-02 DIAGNOSIS — U071 COVID-19: Secondary | ICD-10-CM | POA: Diagnosis not present

## 2020-12-02 DIAGNOSIS — R1310 Dysphagia, unspecified: Secondary | ICD-10-CM | POA: Diagnosis not present

## 2020-12-02 DIAGNOSIS — E870 Hyperosmolality and hypernatremia: Secondary | ICD-10-CM | POA: Diagnosis not present

## 2020-12-02 DIAGNOSIS — M6281 Muscle weakness (generalized): Secondary | ICD-10-CM | POA: Diagnosis not present

## 2020-12-02 DIAGNOSIS — R059 Cough, unspecified: Secondary | ICD-10-CM | POA: Diagnosis not present

## 2020-12-02 DIAGNOSIS — R296 Repeated falls: Secondary | ICD-10-CM | POA: Diagnosis not present

## 2020-12-02 DIAGNOSIS — R627 Adult failure to thrive: Secondary | ICD-10-CM | POA: Diagnosis not present

## 2020-12-03 DIAGNOSIS — N390 Urinary tract infection, site not specified: Secondary | ICD-10-CM | POA: Diagnosis not present

## 2020-12-04 DIAGNOSIS — R627 Adult failure to thrive: Secondary | ICD-10-CM | POA: Diagnosis not present

## 2020-12-04 DIAGNOSIS — E870 Hyperosmolality and hypernatremia: Secondary | ICD-10-CM | POA: Diagnosis not present

## 2020-12-04 DIAGNOSIS — N390 Urinary tract infection, site not specified: Secondary | ICD-10-CM | POA: Diagnosis not present

## 2020-12-04 DIAGNOSIS — I10 Essential (primary) hypertension: Secondary | ICD-10-CM | POA: Diagnosis not present

## 2020-12-05 DIAGNOSIS — R627 Adult failure to thrive: Secondary | ICD-10-CM | POA: Diagnosis not present

## 2020-12-05 DIAGNOSIS — N390 Urinary tract infection, site not specified: Secondary | ICD-10-CM | POA: Diagnosis not present

## 2020-12-05 DIAGNOSIS — E870 Hyperosmolality and hypernatremia: Secondary | ICD-10-CM | POA: Diagnosis not present

## 2020-12-05 DIAGNOSIS — R4189 Other symptoms and signs involving cognitive functions and awareness: Secondary | ICD-10-CM | POA: Diagnosis not present

## 2020-12-05 DIAGNOSIS — I10 Essential (primary) hypertension: Secondary | ICD-10-CM | POA: Diagnosis not present

## 2020-12-08 DIAGNOSIS — M6281 Muscle weakness (generalized): Secondary | ICD-10-CM | POA: Diagnosis not present

## 2020-12-08 DIAGNOSIS — I10 Essential (primary) hypertension: Secondary | ICD-10-CM | POA: Diagnosis not present

## 2020-12-08 DIAGNOSIS — E860 Dehydration: Secondary | ICD-10-CM | POA: Diagnosis not present

## 2020-12-10 ENCOUNTER — Ambulatory Visit: Payer: Commercial Managed Care - HMO | Admitting: Neurology

## 2020-12-10 DIAGNOSIS — R4189 Other symptoms and signs involving cognitive functions and awareness: Secondary | ICD-10-CM | POA: Diagnosis not present

## 2020-12-10 DIAGNOSIS — N390 Urinary tract infection, site not specified: Secondary | ICD-10-CM | POA: Diagnosis not present

## 2020-12-10 DIAGNOSIS — E870 Hyperosmolality and hypernatremia: Secondary | ICD-10-CM | POA: Diagnosis not present

## 2020-12-15 DIAGNOSIS — M6281 Muscle weakness (generalized): Secondary | ICD-10-CM | POA: Diagnosis not present

## 2020-12-15 DIAGNOSIS — I10 Essential (primary) hypertension: Secondary | ICD-10-CM | POA: Diagnosis not present

## 2020-12-16 DIAGNOSIS — R1312 Dysphagia, oropharyngeal phase: Secondary | ICD-10-CM | POA: Diagnosis not present

## 2020-12-16 DIAGNOSIS — R41841 Cognitive communication deficit: Secondary | ICD-10-CM | POA: Diagnosis not present

## 2020-12-16 DIAGNOSIS — R278 Other lack of coordination: Secondary | ICD-10-CM | POA: Diagnosis not present

## 2020-12-16 DIAGNOSIS — Z9181 History of falling: Secondary | ICD-10-CM | POA: Diagnosis not present

## 2020-12-16 DIAGNOSIS — R2681 Unsteadiness on feet: Secondary | ICD-10-CM | POA: Diagnosis not present

## 2020-12-16 DIAGNOSIS — M6281 Muscle weakness (generalized): Secondary | ICD-10-CM | POA: Diagnosis not present

## 2020-12-16 DIAGNOSIS — R2689 Other abnormalities of gait and mobility: Secondary | ICD-10-CM | POA: Diagnosis not present

## 2020-12-16 DIAGNOSIS — G20 Parkinson's disease: Secondary | ICD-10-CM | POA: Diagnosis not present

## 2020-12-16 DIAGNOSIS — R1311 Dysphagia, oral phase: Secondary | ICD-10-CM | POA: Diagnosis not present

## 2020-12-18 DIAGNOSIS — I10 Essential (primary) hypertension: Secondary | ICD-10-CM | POA: Diagnosis not present

## 2020-12-18 DIAGNOSIS — R64 Cachexia: Secondary | ICD-10-CM | POA: Diagnosis not present

## 2020-12-18 DIAGNOSIS — R634 Abnormal weight loss: Secondary | ICD-10-CM | POA: Diagnosis not present

## 2020-12-18 DIAGNOSIS — R627 Adult failure to thrive: Secondary | ICD-10-CM | POA: Diagnosis not present

## 2020-12-23 DIAGNOSIS — M5489 Other dorsalgia: Secondary | ICD-10-CM | POA: Diagnosis not present

## 2020-12-23 DIAGNOSIS — M1388 Other specified arthritis, other site: Secondary | ICD-10-CM | POA: Diagnosis not present

## 2020-12-23 DIAGNOSIS — G20 Parkinson's disease: Secondary | ICD-10-CM | POA: Diagnosis not present

## 2020-12-23 DIAGNOSIS — F3489 Other specified persistent mood disorders: Secondary | ICD-10-CM | POA: Diagnosis not present

## 2020-12-23 DIAGNOSIS — E7849 Other hyperlipidemia: Secondary | ICD-10-CM | POA: Diagnosis not present

## 2020-12-23 DIAGNOSIS — M810 Age-related osteoporosis without current pathological fracture: Secondary | ICD-10-CM | POA: Diagnosis not present

## 2020-12-23 DIAGNOSIS — R627 Adult failure to thrive: Secondary | ICD-10-CM | POA: Diagnosis not present

## 2020-12-23 DIAGNOSIS — I10 Essential (primary) hypertension: Secondary | ICD-10-CM | POA: Diagnosis not present

## 2020-12-23 DIAGNOSIS — R300 Dysuria: Secondary | ICD-10-CM | POA: Diagnosis not present

## 2020-12-23 DIAGNOSIS — F0280 Dementia in other diseases classified elsewhere without behavioral disturbance: Secondary | ICD-10-CM | POA: Diagnosis not present

## 2020-12-23 DIAGNOSIS — R262 Difficulty in walking, not elsewhere classified: Secondary | ICD-10-CM | POA: Diagnosis not present

## 2021-01-01 DIAGNOSIS — R627 Adult failure to thrive: Secondary | ICD-10-CM | POA: Diagnosis not present

## 2021-01-01 DIAGNOSIS — G20 Parkinson's disease: Secondary | ICD-10-CM | POA: Diagnosis not present

## 2021-01-01 DIAGNOSIS — I679 Cerebrovascular disease, unspecified: Secondary | ICD-10-CM | POA: Diagnosis not present

## 2021-01-01 DIAGNOSIS — E785 Hyperlipidemia, unspecified: Secondary | ICD-10-CM | POA: Diagnosis not present

## 2021-01-06 DIAGNOSIS — L89150 Pressure ulcer of sacral region, unstageable: Secondary | ICD-10-CM | POA: Diagnosis not present

## 2021-01-06 DIAGNOSIS — R1310 Dysphagia, unspecified: Secondary | ICD-10-CM | POA: Diagnosis not present

## 2021-01-06 DIAGNOSIS — R627 Adult failure to thrive: Secondary | ICD-10-CM | POA: Diagnosis not present

## 2021-01-13 ENCOUNTER — Non-Acute Institutional Stay: Payer: PPO | Admitting: Student

## 2021-01-13 ENCOUNTER — Other Ambulatory Visit: Payer: Self-pay

## 2021-01-13 DIAGNOSIS — F0150 Vascular dementia without behavioral disturbance: Secondary | ICD-10-CM

## 2021-01-13 DIAGNOSIS — Z515 Encounter for palliative care: Secondary | ICD-10-CM | POA: Diagnosis not present

## 2021-01-13 DIAGNOSIS — G4089 Other seizures: Secondary | ICD-10-CM | POA: Diagnosis not present

## 2021-01-13 DIAGNOSIS — L89150 Pressure ulcer of sacral region, unstageable: Secondary | ICD-10-CM | POA: Diagnosis not present

## 2021-01-13 DIAGNOSIS — R4189 Other symptoms and signs involving cognitive functions and awareness: Secondary | ICD-10-CM | POA: Diagnosis not present

## 2021-01-13 NOTE — Progress Notes (Signed)
[facility] Consult Note Telephone: [phone]  Fax: [phone]  PATIENT NAME: Kristina Simmons [place] [id number] [phone] (home)  DOB: 27-May-1934 MRN: [id number]  PRIMARY CARE PROVIDER:    Pcp, No,  No address on file None  REFERRING PROVIDER:   No referring provider defined for this encounter. N/A  RESPONSIBLE PARTY:   Extended Emergency Contact Information Primary Emergency Contact: Rozelle, steve Mobile Phone: [phone] Relation: Son  I met face to face with patient in facility, son via telephone. Patient present but unable to substantively engage in process.   ASSESSMENT AND RECOMMENDATIONS:   Advance Care Planning: Visit at the request of Dr. Keenan Bachelor for palliative consult. Visit consisted of building trust and discussions on Palliative care medicine as specialized medical care for people living with serious illness, aimed at facilitating improved quality of life through symptoms relief, assisting with advance care planning and establishing goals of care. Education provided on Palliative vs. Hospice services. Palliative care will continue to provide support to patient, family and the medical team.   Spoke with son [name] he acknowledges patient general decline. He would like for patient to be comfortable. Reviewed MOST form; he is in agreement with hospice services. NP spoke with PCP, hospice physician. Patient being referred for hospice services due to decline in condition with diagnoses of vascular dementia, cerebrovascular disease, parkinson's disease.   Goal of care: For patient to be comfortable, maintain dignity.   Directives: MOST form, DNR.  Symptom Management:   Vascular Dementia-patient dependent for all adl's; she has had a recent decline in condition. Poor appetite, bed bound, decreased responsiveness. Staff to continue to assist with adl's. Patient being referred to Hospice.    Follow up Palliative Care Visit: Palliative care will continue to follow for complex decision making and symptom management and follow through admission to hospice services.   Family /Caregiver/Community Supports: Palliative Medicine will follow through admission to hospice services.    I spent 45  minutes providing this consultation, time includes time spent with patient/family, chart review, provider coordination, and documentation. More than 50% of the time in this consultation was spent counseling and coordinating communication.   CHIEF COMPLAINT: Palliative Medicine initial consult, decline in condition.   History obtained from review of EMR, discussion with primary team, and  interview with family. Records reviewed and summarized below.  HISTORY OF PRESENT ILLNESS:  Kristina Simmons is a 87 y.o. year old female with multiple medical problems including Vascular dementia, cerebrovascular disease,  parkinson's disease, essential hypertension,GERD, osteoporosis without pathological fracture, Vitamin D deficiency. Palliative Care was asked to follow this patient by consultation request of Dr. Keenan Bachelor to help address advance care planning and goals of care. This is an initial visit.  Nursing staff report patient having Covid-19 infection in January and she has continued to decline since then. She is dependent for all adl's, bed bound, incontinent of bowel and bladder. Weight 150.6 pounds on 12/05/2020, weight 129.6 on 01/12/2021. Poor appetite reported; in past several days, staff report patient only taking bites and sips of fluids and she is pocketing foods. She is sleeping throughout day/night now. Today she is less alert, does not open her eyes. Wounds to bilateral heels, coccyx per staff; wound care per facility staff.   CODE STATUS: DNR  PPS: 20%, weak  HOSPICE ELIGIBILITY/DIAGNOSIS: Vascular dementia, cerebrovascular disease.  ROS   ROS deferred; patient unable to contribute to ROS  due to decreased responsiveness.  Physical Exam: Pulse 104, resp 16, b/p 136/90, sats 94% on room air Constitutional: NAD, resting in bed, PAINAD-0 General: frail, ill appearing EYES: anicteric sclera, lids intact, no discharge  ENMT: intact hearing, dry oral mucous membranes CV: RRR,  1-2+ pedal edema L > R Pulmonary: scattered rhonchi, no increased work of breathing, non productive cough Abdomen: bowel sounds normoactive x 4 GU: deferred MSK: sarcopenia, decreased ROM in all extremities, non ambulatory Skin: warm and dry, no rashes or wounds on visible skin Neuro: Generalized weakness; does not respond to verbal or tactile stimulation Hem/lymph/immuno: no widespread bruising   PAST MEDICAL HISTORY:  Past Medical History:  Diagnosis Date  . Arthritis   . Breast cancer ([place]) 2010   DCIS; diagnosed on January 15, 2009. Left   . Breast screening, unspecified   . Bursitis    left hip  . Fall   . Fibromyalgia 2011  . Humerus fracture Mar 01, 2016   left  . Kidney stones   . Osteoporosis 08/23/2018  . Parkinson's disease ([place])   . Personal history of malignant neoplasm of breast 2010   left breast cancer; status post wide excision with mastoplasty followed by whole breast radiation and tamoxifen  . Personal history of radiation therapy 2010   BREAST CA  . Sleep apnea 2006  . Special screening for malignant neoplasms, colon   . Tremor 2011  . Undiagnosed cardiac murmurs   . Unspecified essential hypertension     SOCIAL HX:  Social History   Tobacco Use  . Smoking status: Never Smoker  . Smokeless tobacco: Never Used  Substance Use Topics  . Alcohol use: No   FAMILY HX:  Family History  Problem Relation Age of Onset  . Thyroid disease Mother   . Heart disease Father   . Breast cancer Neg Hx     ALLERGIES: No Known Allergies   PERTINENT MEDICATIONS:  Outpatient Encounter Medications as of 01/13/2021  Medication Sig  . acetaminophen (TYLENOL) 500 MG tablet Take 1  tablet (500 mg total) by mouth every 6 (six) hours as needed. (Patient taking differently: Take 1,000 mg by mouth in the morning and at bedtime. )  . amLODipine (NORVASC) 2.5 MG tablet Take 2.5 mg by mouth daily.  Marland Kitchen aspirin EC 81 MG tablet Take 81 mg by mouth at bedtime. Swallow whole.  Marland Kitchen atorvastatin (LIPITOR) 20 MG tablet Take 1 tablet (20 mg total) by mouth daily.  . calcium carbonate (OS-CAL - DOSED IN MG OF ELEMENTAL CALCIUM) 1250 (500 Ca) MG tablet Take 1 tablet (500 mg of elemental calcium total) by mouth daily.  . calcium carbonate (TUMS - DOSED IN MG ELEMENTAL CALCIUM) 500 MG chewable tablet Chew 2 tablets by mouth daily.  Marland Kitchen Dextran 70-Hypromellose (ARTIFICIAL TEARS) 0.1-0.3 % SOLN Apply 1 drop to eye in the morning, at noon, in the evening, and at bedtime.  . diclofenac Sodium (VOLTAREN) 1 % GEL Apply 2 g topically in the morning and at bedtime.   Marland Kitchen escitalopram (LEXAPRO) 10 MG tablet Take 1 tablet (10 mg total) by mouth daily. (Patient taking differently: Take 15 mg by mouth daily. )  . fluticasone (FLONASE) 50 MCG/ACT nasal spray Place 1 spray into both nostrils every other day.  . gabapentin (NEURONTIN) 100 MG capsule Take 100 mg by mouth daily.   Marland Kitchen gabapentin (NEURONTIN) 300 MG capsule Take 300 mg by mouth at bedtime.   . lidocaine (LIDODERM) 5 % Place 1 patch onto the skin daily. Remove & Discard  patch within 12 hours or as directed by MD  . melatonin 3 MG TABS tablet Take 3 mg by mouth at bedtime.  . Omega-3 Fatty Acids (FISH OIL) 1000 MG CAPS Take 1 capsule by mouth daily.  . polyethylene glycol (MIRALAX / GLYCOLAX) 17 g packet Take 17 g by mouth daily.  . primidone (MYSOLINE) 50 MG tablet Take 50 mg by mouth 2 (two) times a day.  . Vitamin D, Ergocalciferol, (DRISDOL) 1.25 MG (50000 UNIT) CAPS capsule Take 50,000 Units by mouth every 28 (twenty-eight) days.   No facility-administered encounter medications on file as of 01/13/2021.     Thank you for the opportunity to  participate in the care of Ms. Tomlinson. The palliative care team will continue to follow. Please call our office at [phone] if we can be of additional assistance.  Ezekiel Slocumb, NP

## 2021-01-15 DIAGNOSIS — L89154 Pressure ulcer of sacral region, stage 4: Secondary | ICD-10-CM | POA: Diagnosis not present

## 2021-01-15 DIAGNOSIS — Z515 Encounter for palliative care: Secondary | ICD-10-CM | POA: Diagnosis not present

## 2021-01-15 DIAGNOSIS — L89150 Pressure ulcer of sacral region, unstageable: Secondary | ICD-10-CM | POA: Diagnosis not present

## 2021-01-15 DIAGNOSIS — F1120 Opioid dependence, uncomplicated: Secondary | ICD-10-CM | POA: Diagnosis not present

## 2021-02-15 DEATH — deceased

## 2021-03-17 ENCOUNTER — Ambulatory Visit: Payer: Commercial Managed Care - HMO | Admitting: Neurology

## 2021-03-31 ENCOUNTER — Encounter: Payer: Self-pay | Admitting: Neurology

## 2021-03-31 ENCOUNTER — Ambulatory Visit: Payer: Commercial Managed Care - HMO | Admitting: Neurology

## 2021-06-07 IMAGING — MR MR HEAD W/ CM
3 of 4 series · 22 of 48 positions shown · IV contrast ([ID] GADAVIST)
Comparison: MR head without contrast/[DATE]

CLINICAL DATA: TIA.  Right orbital lesion.

EXAM:
MRI HEAD WITH CONTRAST
TECHNIQUE: Multiplanar, multiecho pulse sequences of the brain and surrounding
structures were obtained with intravenous contrast.
CONTRAST:  7.5mL GADAVIST GADOBUTROL 1 MMOL/ML IV SOLN

[Series 7: T1 post-contrast · sagittal · 5.0mm · 0.75mm/px · 9 of 25 slices shown (1 of 2)]
[im 1/25]
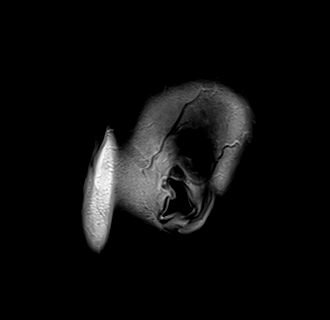
[im 4/25]
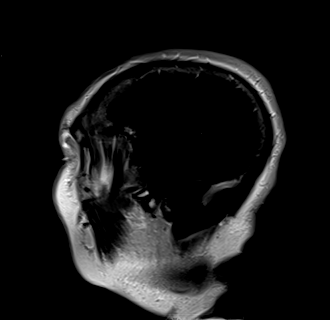
[im 7/25]
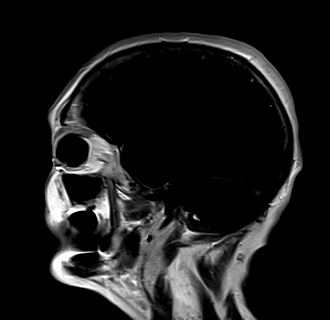
[im 10/25]
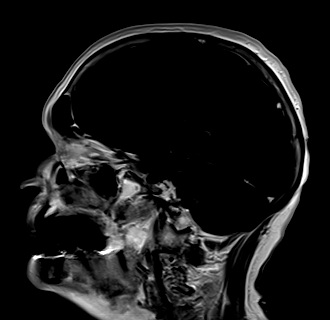
[im 13/25]
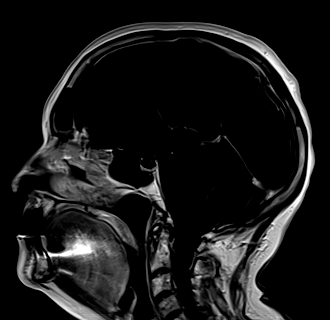
[im 16/25]
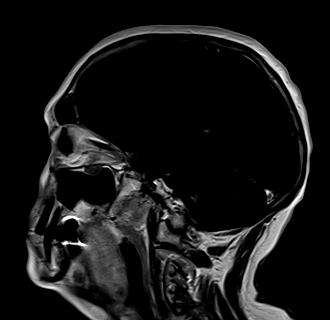
[im 19/25]
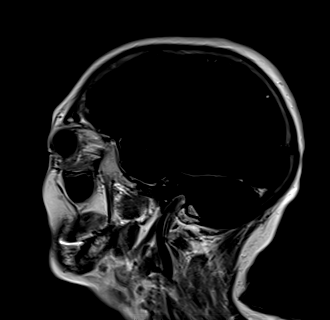
[im 22/25]
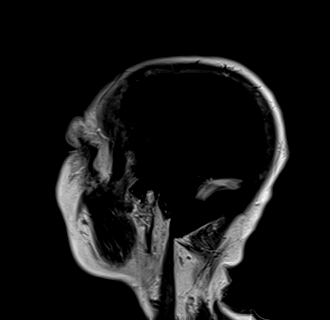
[im 25/25]
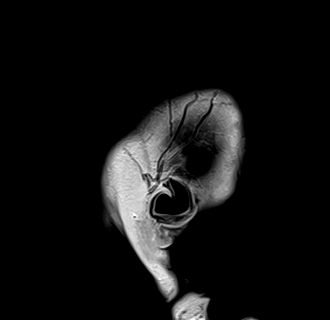

[Series 9: T1 post-contrast · coronal · 5.0mm · 0.34mm/px · 9 of 34 slices shown (2 of 2)]
[im 1/34]
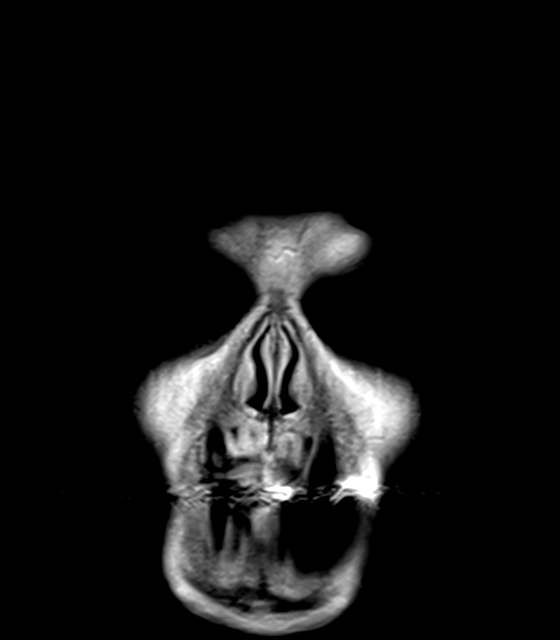
[im 7/34]
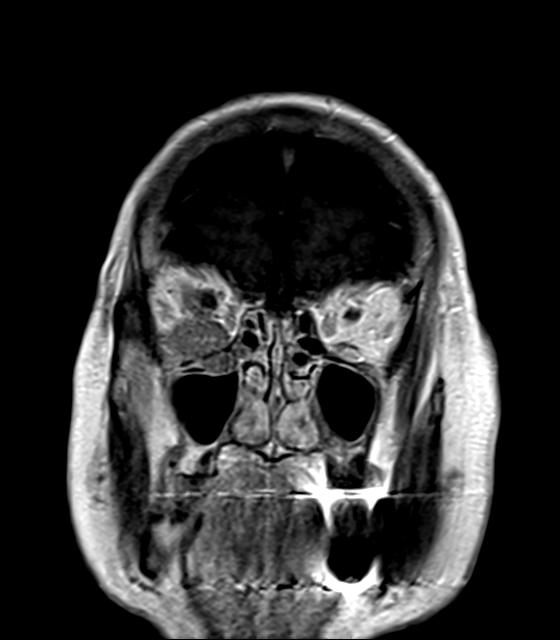
[im 10/34]
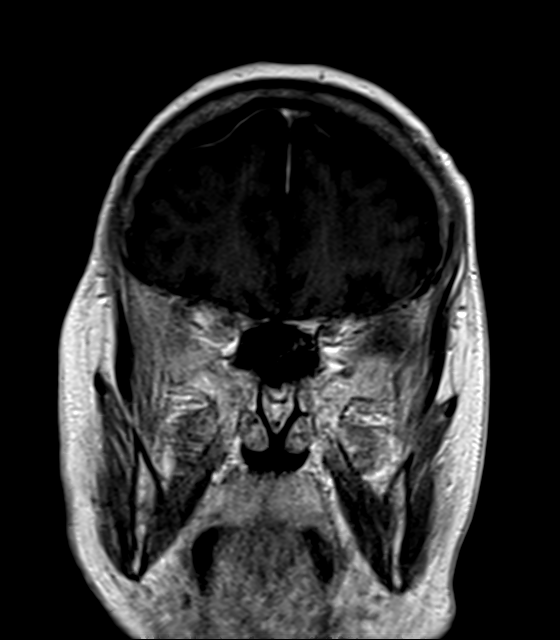
[im 16/34]
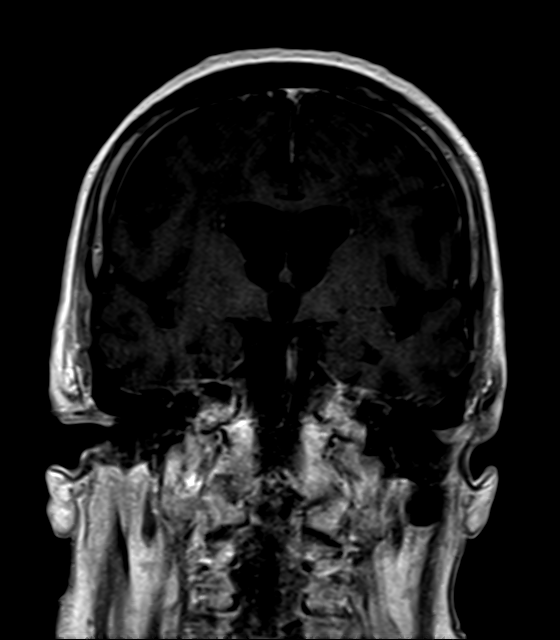
[im 19/34]
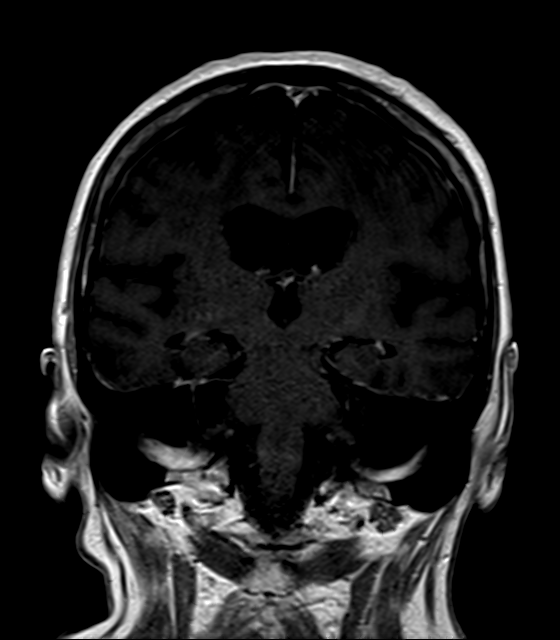
[im 25/34]
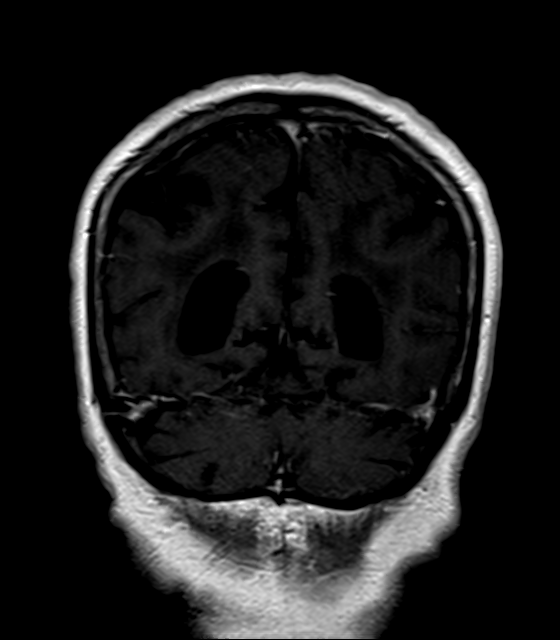
[im 28/34]
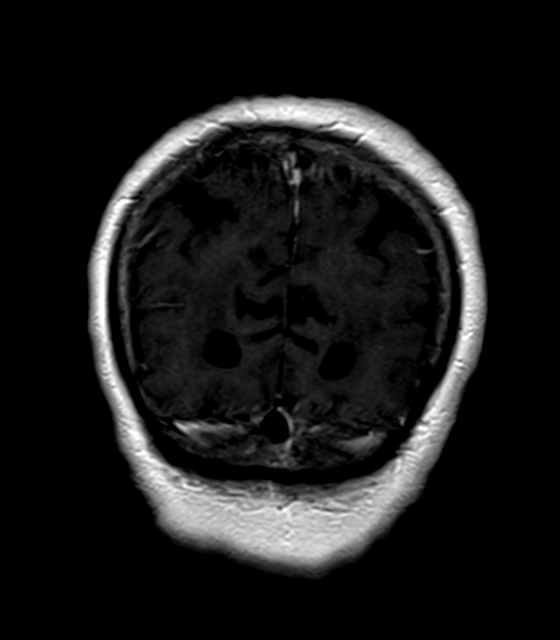
[im 31/34]
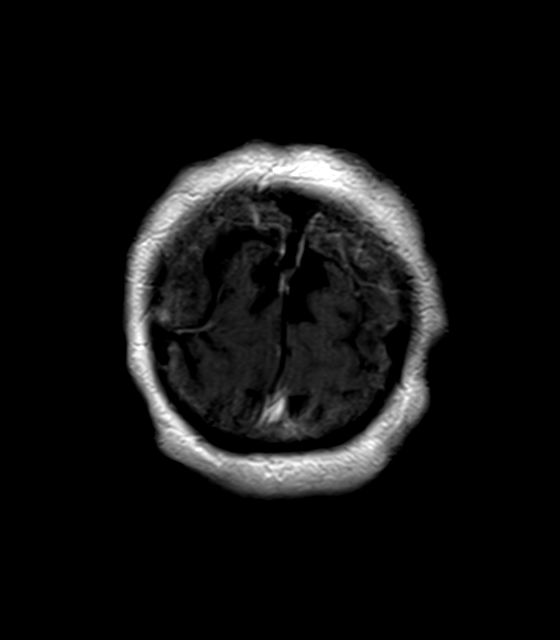
[im 34/34]
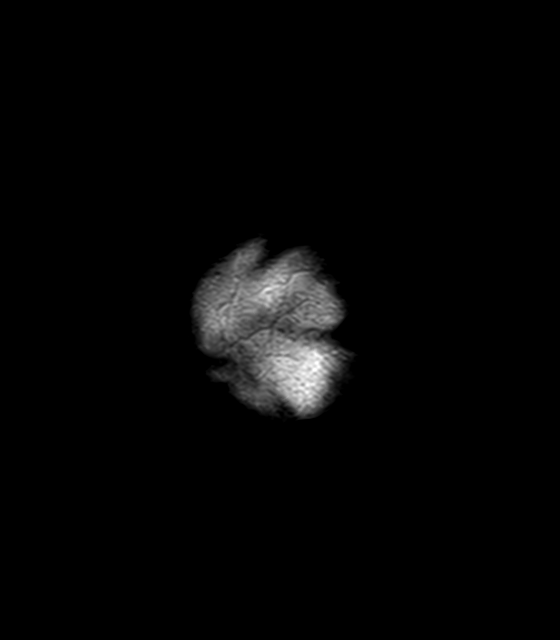

[Series 10: T1 fat-sat post-contrast · axial · 3.0mm · 0.37mm/px · z∈[-64,-6]mm · 4 of 20 slices shown]
[im 1/20]
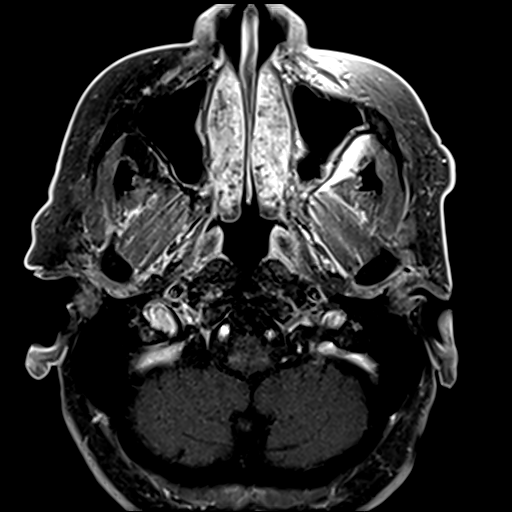
[im 4/20]
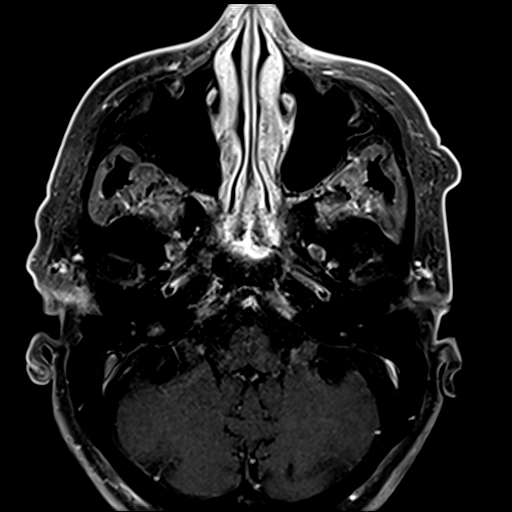
[im 10/20]
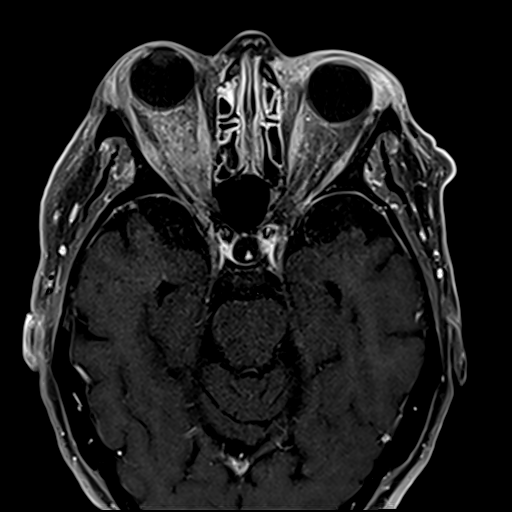
[im 16/20]
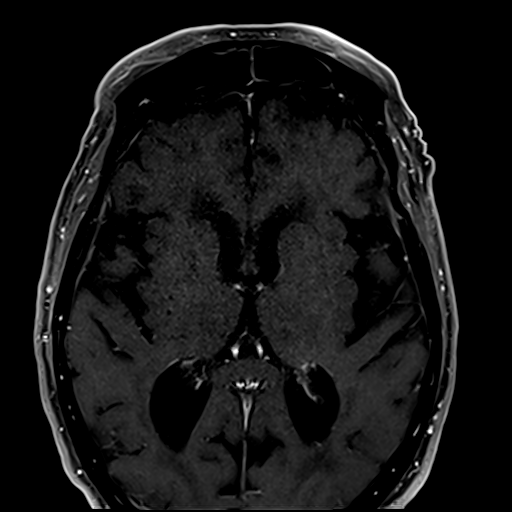

[22 of 48 positions shown; findings below may reference images not displayed]

FINDINGS: Brain: Postcontrast images brain demonstrate no pathologic
enhancement brain parenchyma.

Vascular: Normal vascular enhancement is present.

Skull and upper cervical spine: The craniocervical junction is
normal. Upper cervical spine is within normal limits. Marrow signal
is unremarkable.

Sinuses/Orbits: Normal mucosal enhancement is present within the
sinuses.

Homogeneous enhancement is present within the right inferior orbital
lesion. The soft tissue enhancement extends to the inferior orbital
rim anteriorly. Posterior extension does not reach the orbital apex.
The inferior rectus muscle is not seen separately. Soft tissue is
noted adjacent to the optic nerve inferiorly and laterally.
IMPRESSION: 1. Homogeneous enhancement of the right inferior orbital lesion. The
inferior rectus muscle is not seen separately. The lesion has
increased in size over time is concerning for neoplasm metastatic
disease is considered history of breast cancer. Sarcoma of the
inferior rectus muscle is also considered. Recommend ophthalmology
consult.
2. No pathologic enhancement of the brain parenchyma.
